# Patient Record
Sex: Female | Born: 1955
Health system: Southern US, Community
[De-identification: ages and names within clinical notes are randomized; demographics above are authoritative.]

## PROBLEM LIST (undated history)

## (undated) DIAGNOSIS — F319 Bipolar disorder, unspecified: Secondary | ICD-10-CM

## (undated) DIAGNOSIS — IMO0002 Reserved for concepts with insufficient information to code with codable children: Secondary | ICD-10-CM

## (undated) DIAGNOSIS — I10 Essential (primary) hypertension: Secondary | ICD-10-CM

## (undated) DIAGNOSIS — F32A Depression, unspecified: Secondary | ICD-10-CM

## (undated) DIAGNOSIS — K5900 Constipation, unspecified: Secondary | ICD-10-CM

## (undated) DIAGNOSIS — R419 Unspecified symptoms and signs involving cognitive functions and awareness: Secondary | ICD-10-CM

## (undated) DIAGNOSIS — F329 Major depressive disorder, single episode, unspecified: Secondary | ICD-10-CM

## (undated) DIAGNOSIS — E785 Hyperlipidemia, unspecified: Secondary | ICD-10-CM

## (undated) DIAGNOSIS — F609 Personality disorder, unspecified: Secondary | ICD-10-CM

## (undated) HISTORY — DX: Hyperlipidemia, unspecified: E78.5

## (undated) HISTORY — PX: RIGHT OOPHORECTOMY: SHX2359

## (undated) HISTORY — DX: Depression, unspecified: F32.A

## (undated) HISTORY — PX: DILATION AND CURETTAGE OF UTERUS: SHX78

## (undated) HISTORY — DX: Constipation, unspecified: K59.00

## (undated) HISTORY — DX: Unspecified symptoms and signs involving cognitive functions and awareness: R41.9

## (undated) HISTORY — DX: Major depressive disorder, single episode, unspecified: F32.9

## (undated) HISTORY — DX: Personality disorder, unspecified: F60.9

---

## 2000-09-24 ENCOUNTER — Emergency Department (HOSPITAL_COMMUNITY): Admission: EM | Admit: 2000-09-24 | Discharge: 2000-09-24 | Payer: Self-pay | Admitting: Emergency Medicine

## 2006-12-11 ENCOUNTER — Emergency Department (HOSPITAL_COMMUNITY): Admission: EM | Admit: 2006-12-11 | Discharge: 2006-12-11 | Payer: Self-pay | Admitting: *Deleted

## 2006-12-21 ENCOUNTER — Emergency Department (HOSPITAL_COMMUNITY): Admission: EM | Admit: 2006-12-21 | Discharge: 2006-12-21 | Payer: Self-pay | Admitting: Emergency Medicine

## 2006-12-24 ENCOUNTER — Emergency Department (HOSPITAL_COMMUNITY): Admission: EM | Admit: 2006-12-24 | Discharge: 2006-12-24 | Payer: Self-pay | Admitting: Emergency Medicine

## 2007-01-15 ENCOUNTER — Emergency Department (HOSPITAL_COMMUNITY): Admission: EM | Admit: 2007-01-15 | Discharge: 2007-01-15 | Payer: Self-pay | Admitting: Emergency Medicine

## 2007-01-27 ENCOUNTER — Emergency Department (HOSPITAL_COMMUNITY): Admission: EM | Admit: 2007-01-27 | Discharge: 2007-01-27 | Payer: Self-pay | Admitting: Emergency Medicine

## 2007-03-28 ENCOUNTER — Encounter: Admission: RE | Admit: 2007-03-28 | Discharge: 2007-03-28 | Payer: Self-pay | Admitting: Family Medicine

## 2007-04-26 ENCOUNTER — Emergency Department (HOSPITAL_COMMUNITY): Admission: EM | Admit: 2007-04-26 | Discharge: 2007-04-27 | Payer: Self-pay | Admitting: Emergency Medicine

## 2007-05-23 ENCOUNTER — Encounter: Admission: RE | Admit: 2007-05-23 | Discharge: 2007-05-23 | Payer: Self-pay | Admitting: Family Medicine

## 2007-06-06 ENCOUNTER — Encounter: Admission: RE | Admit: 2007-06-06 | Discharge: 2007-06-06 | Payer: Self-pay | Admitting: Obstetrics & Gynecology

## 2007-08-18 ENCOUNTER — Encounter: Admission: RE | Admit: 2007-08-18 | Discharge: 2007-10-30 | Payer: Self-pay | Admitting: Family Medicine

## 2008-01-25 ENCOUNTER — Other Ambulatory Visit: Payer: Self-pay | Admitting: *Deleted

## 2008-01-25 ENCOUNTER — Ambulatory Visit: Payer: Self-pay | Admitting: Psychiatry

## 2008-01-25 ENCOUNTER — Other Ambulatory Visit: Payer: Self-pay

## 2008-01-25 ENCOUNTER — Inpatient Hospital Stay (HOSPITAL_COMMUNITY): Admission: EM | Admit: 2008-01-25 | Discharge: 2008-01-27 | Payer: Self-pay | Admitting: Psychiatry

## 2008-05-29 IMAGING — CR DG CHEST 2V
2 series · 2 of 2 positions shown · non-contrast
Comparison: None.

CLINICAL DATA: Cough, chest congestion, myalgias and pharyngitis.

CHEST - 2 VIEW

[w chest pa]
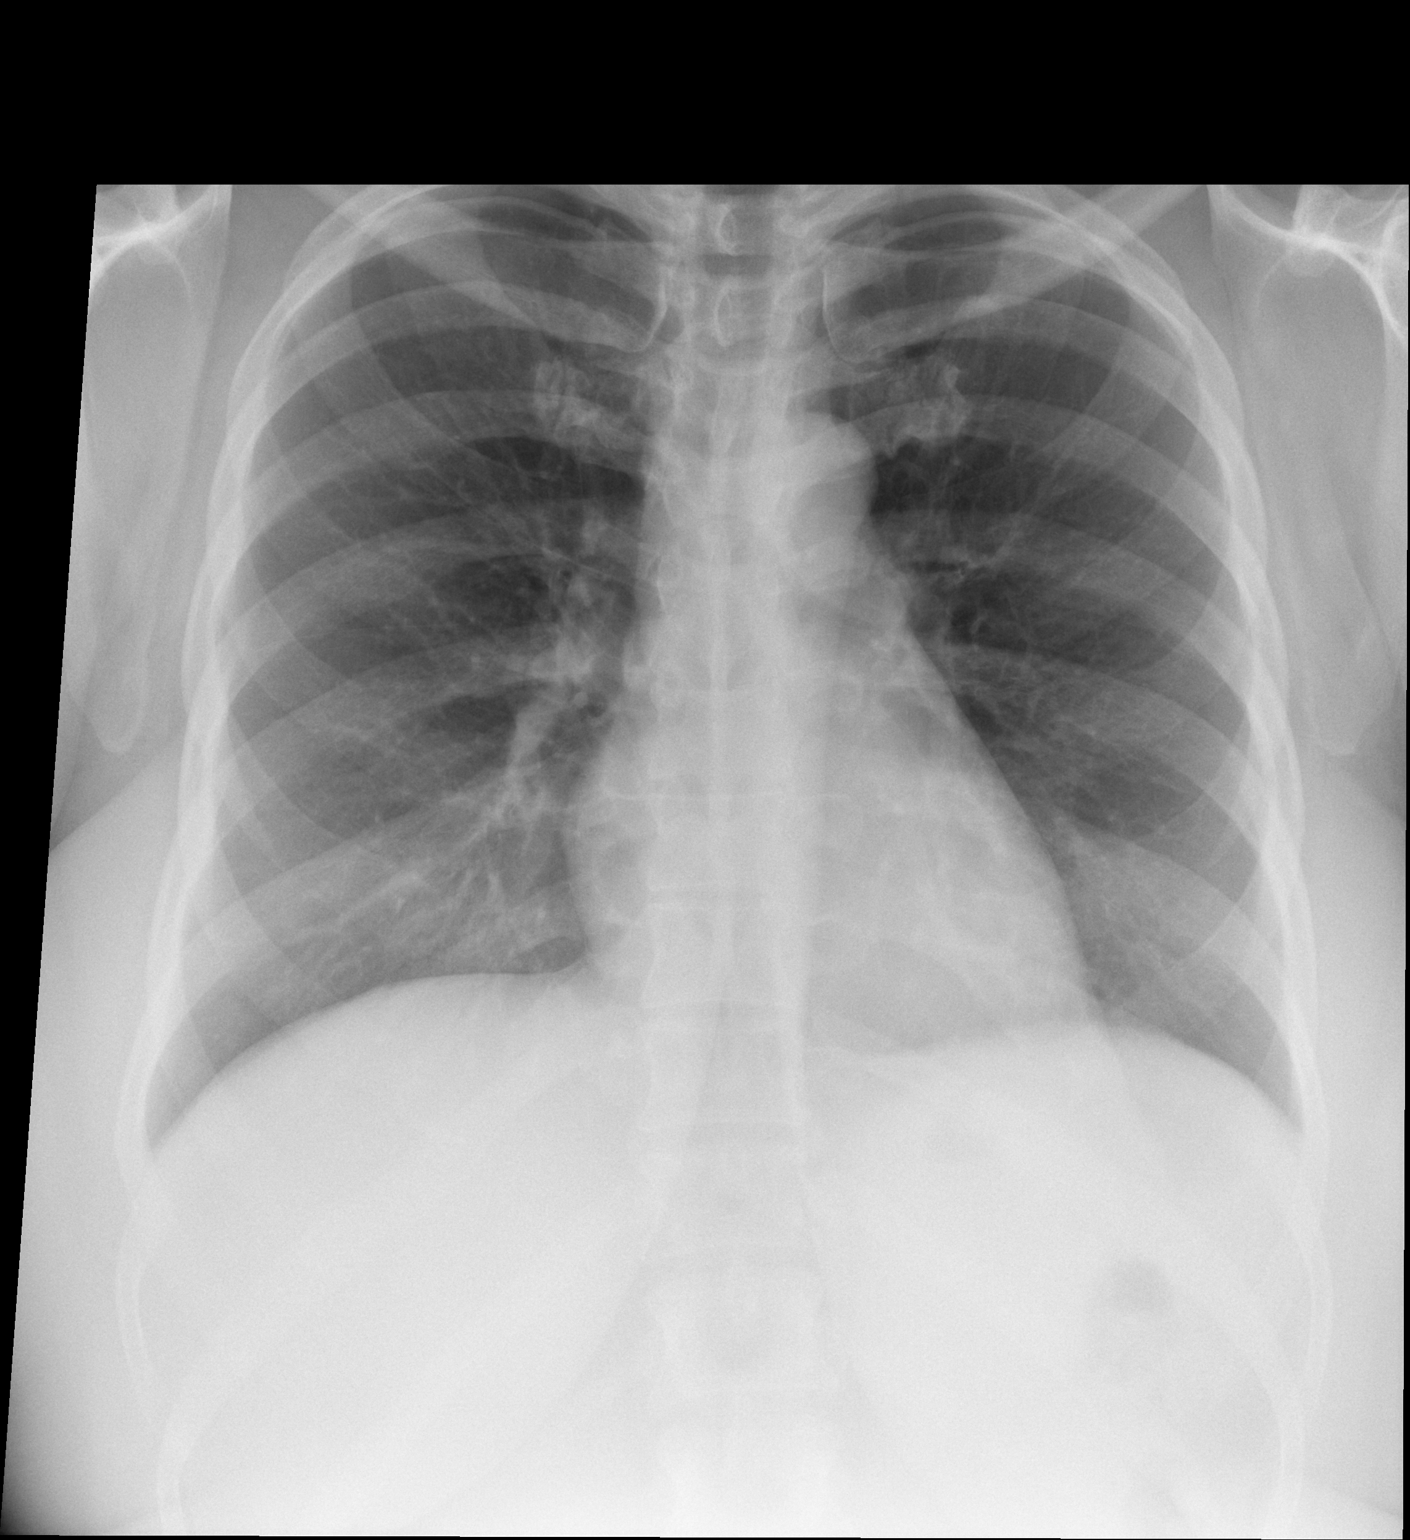

[w chest lat]
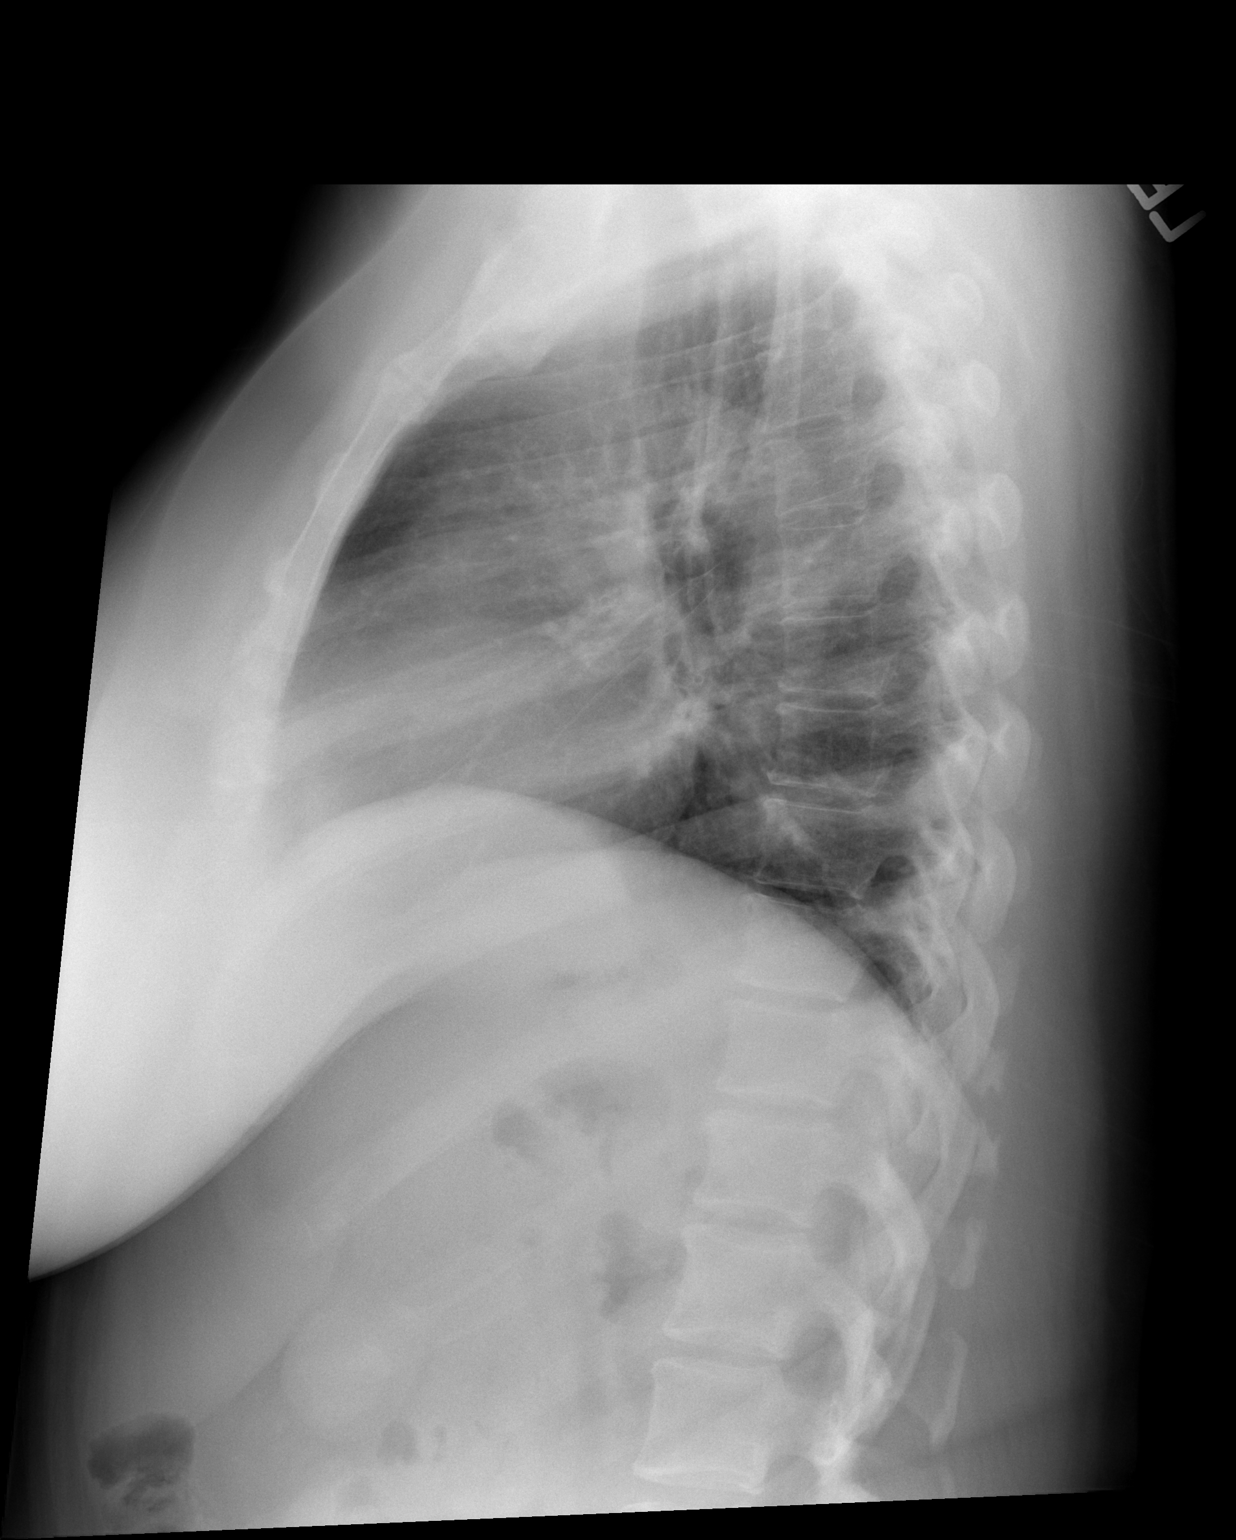

[2 of 2 positions shown; findings below may reference images not displayed]

FINDINGS: Normal sized heart. Clear lungs. Central peribronchial thickening.
Minimal scoliosis and minimal thoracic spine degenerative changes.

IMPRESSION

Mild bronchitic changes.

## 2008-06-25 IMAGING — MG MM SCREEN MAMMOGRAM BILATERAL
4 series · 4 of 4 positions shown · non-contrast
Comparison: none

DG SCREEN MAMMOGRAM BILATERAL
Bilateral CC and MLO view(s) were taken.

DIGITAL SCREENING MAMMOGRAM WITH CAD:
The breast tissue is almost entirely fatty.  There is no dominant mass, architectural distortion or
calcification to suggest malignancy.

[R CC]
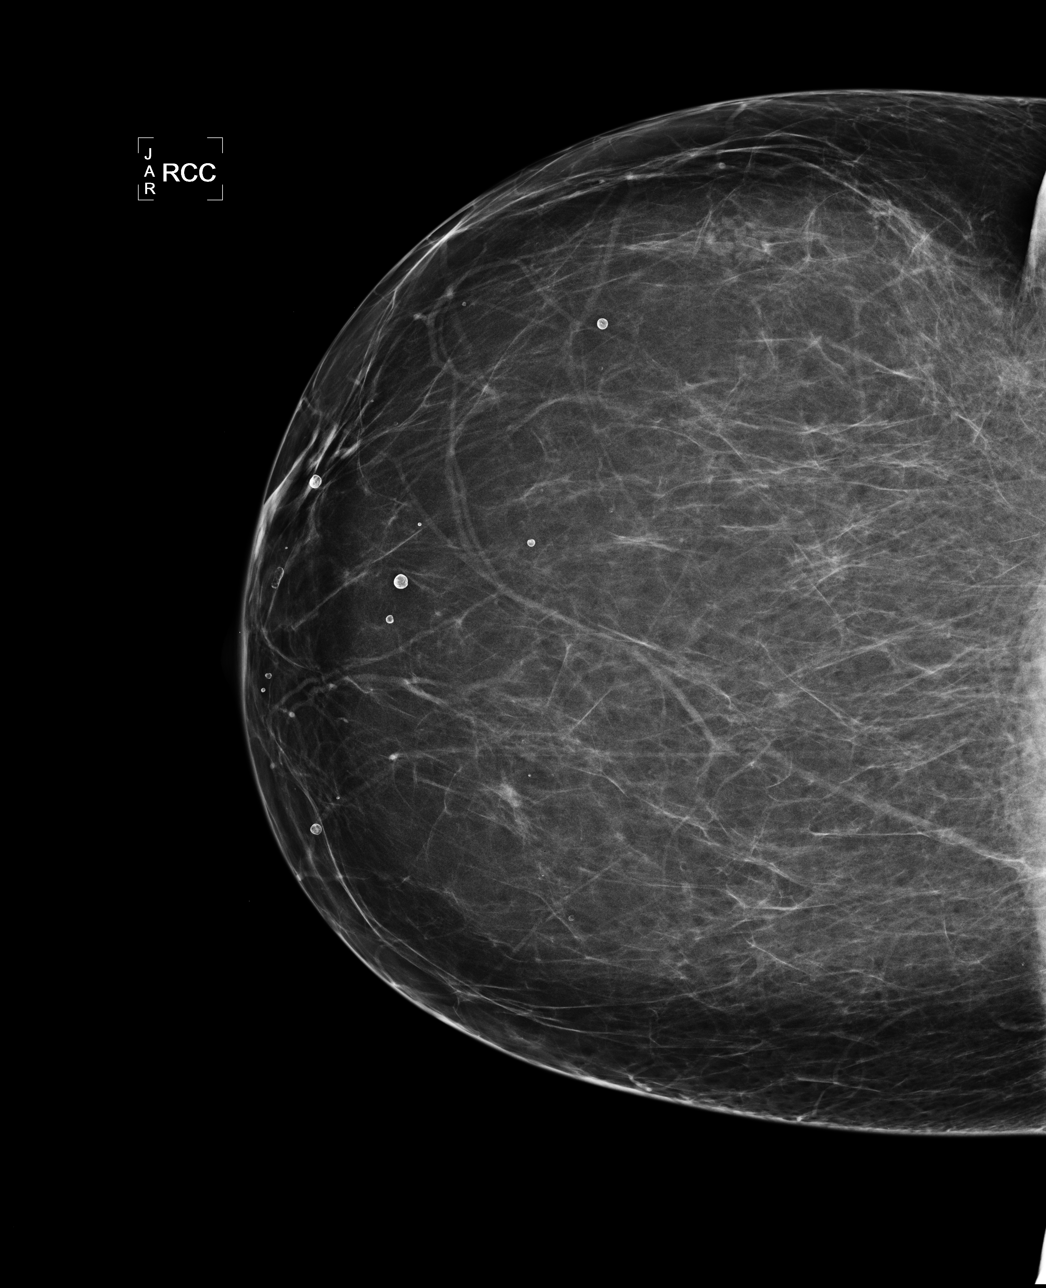

[L CC]
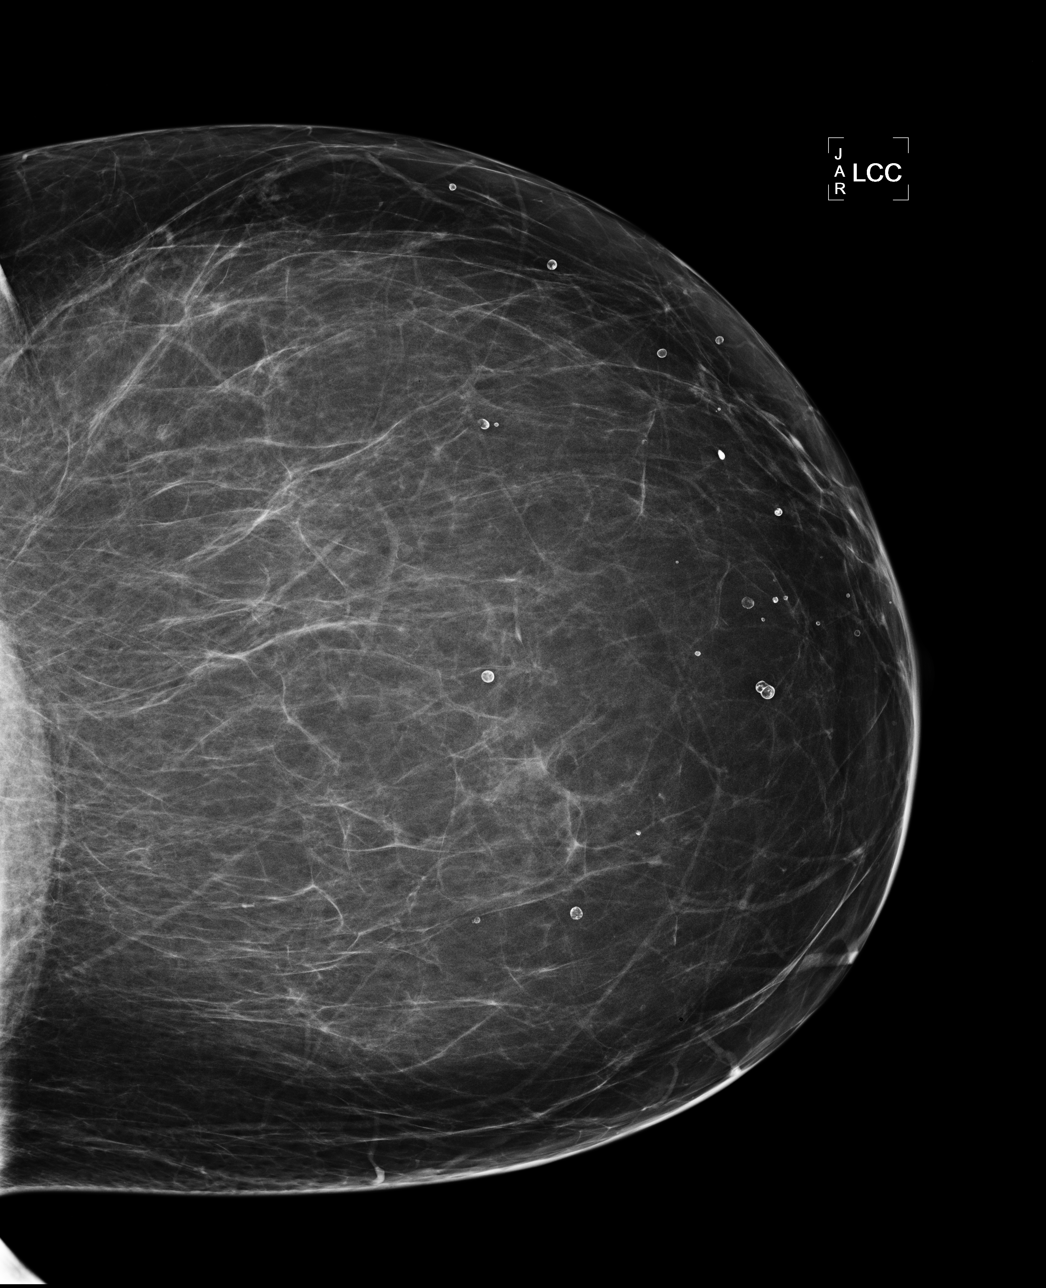

[L MLO]
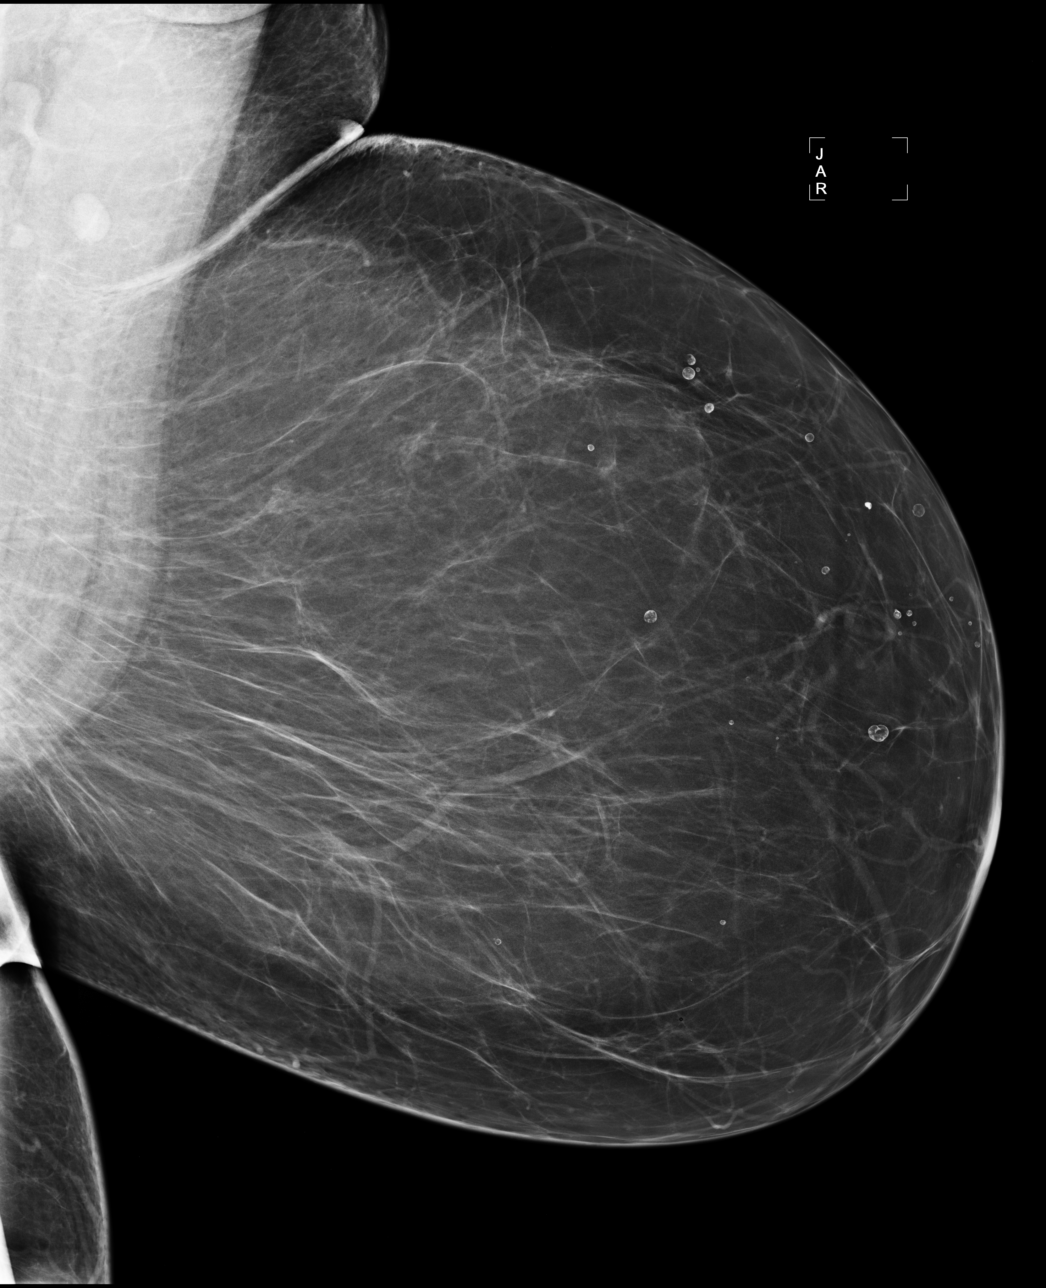

[R MLO]
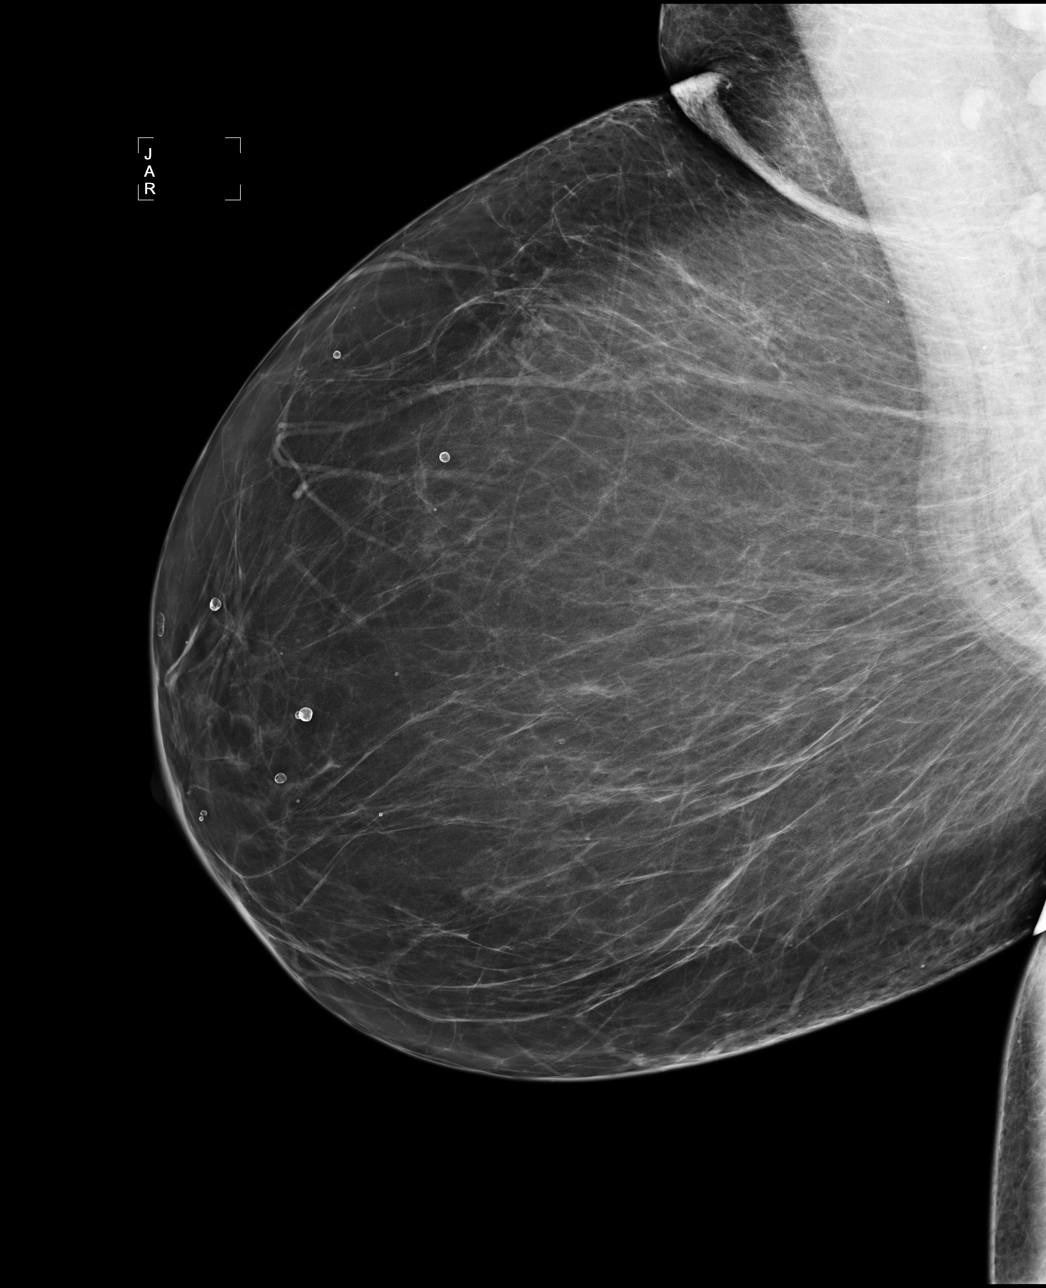

[4 of 4 positions shown; findings below may reference images not displayed]

IMPRESSION: No mammographic evidence of malignancy.  Suggest yearly screening mammography.

ASSESSMENT: Negative - BI-RADS 1

Screening mammogram in 1 year.
ANALYZED BY COMPUTER AIDED DETECTION. , THIS PROCEDURE WAS A DIGITAL MAMMOGRAM.

## 2008-12-13 ENCOUNTER — Ambulatory Visit: Payer: Self-pay | Admitting: *Deleted

## 2008-12-13 ENCOUNTER — Inpatient Hospital Stay (HOSPITAL_COMMUNITY): Admission: AD | Admit: 2008-12-13 | Discharge: 2008-12-16 | Payer: Self-pay | Admitting: *Deleted

## 2009-07-07 ENCOUNTER — Ambulatory Visit: Payer: Self-pay | Admitting: Internal Medicine

## 2009-07-15 ENCOUNTER — Ambulatory Visit: Payer: Self-pay | Admitting: Internal Medicine

## 2009-07-21 ENCOUNTER — Ambulatory Visit (HOSPITAL_COMMUNITY): Admission: RE | Admit: 2009-07-21 | Discharge: 2009-07-21 | Payer: Self-pay | Admitting: Internal Medicine

## 2009-08-14 ENCOUNTER — Emergency Department (HOSPITAL_COMMUNITY): Admission: EM | Admit: 2009-08-14 | Discharge: 2009-08-14 | Payer: Self-pay | Admitting: Emergency Medicine

## 2009-08-31 ENCOUNTER — Ambulatory Visit: Payer: Self-pay | Admitting: Internal Medicine

## 2009-08-31 ENCOUNTER — Emergency Department (HOSPITAL_COMMUNITY): Admission: EM | Admit: 2009-08-31 | Discharge: 2009-09-01 | Payer: Self-pay | Admitting: Emergency Medicine

## 2009-10-04 ENCOUNTER — Ambulatory Visit: Payer: Self-pay | Admitting: Family Medicine

## 2009-10-04 ENCOUNTER — Encounter (INDEPENDENT_AMBULATORY_CARE_PROVIDER_SITE_OTHER): Payer: Self-pay | Admitting: Family Medicine

## 2009-11-28 ENCOUNTER — Ambulatory Visit: Payer: Self-pay | Admitting: Internal Medicine

## 2010-01-06 ENCOUNTER — Ambulatory Visit (HOSPITAL_COMMUNITY): Admission: RE | Admit: 2010-01-06 | Discharge: 2010-01-06 | Payer: Self-pay | Admitting: Family Medicine

## 2010-01-09 ENCOUNTER — Encounter
Admission: RE | Admit: 2010-01-09 | Discharge: 2010-02-17 | Payer: Self-pay | Source: Home / Self Care | Admitting: Family Medicine

## 2010-03-06 ENCOUNTER — Encounter (INDEPENDENT_AMBULATORY_CARE_PROVIDER_SITE_OTHER): Payer: Self-pay | Admitting: *Deleted

## 2010-03-06 LAB — CONVERTED CEMR LAB
AST: 99 units/L — ABNORMAL HIGH (ref 0–37)
Alkaline Phosphatase: 123 units/L — ABNORMAL HIGH (ref 39–117)
BUN: 17 mg/dL (ref 6–23)
Basophils Relative: 0 % (ref 0–1)
Creatinine, Ser: 0.88 mg/dL (ref 0.40–1.20)
Eosinophils Absolute: 0 10*3/uL (ref 0.0–0.7)
Eosinophils Relative: 1 % (ref 0–5)
Glucose, Bld: 146 mg/dL — ABNORMAL HIGH (ref 70–99)
HCT: 39.4 % (ref 36.0–46.0)
MCHC: 31 g/dL (ref 30.0–36.0)
MCV: 97.8 fL (ref 78.0–100.0)
Monocytes Absolute: 0.3 10*3/uL (ref 0.1–1.0)
Monocytes Relative: 7 % (ref 3–12)
Neutrophils Relative %: 54 % (ref 43–77)
Potassium: 4.8 meq/L (ref 3.5–5.3)
RBC: 4.03 M/uL (ref 3.87–5.11)
Total Bilirubin: 0.3 mg/dL (ref 0.3–1.2)

## 2010-03-22 ENCOUNTER — Ambulatory Visit (HOSPITAL_BASED_OUTPATIENT_CLINIC_OR_DEPARTMENT_OTHER)
Admission: RE | Admit: 2010-03-22 | Discharge: 2010-03-22 | Payer: Self-pay | Source: Home / Self Care | Attending: Family Medicine | Admitting: Family Medicine

## 2010-04-12 ENCOUNTER — Encounter (INDEPENDENT_AMBULATORY_CARE_PROVIDER_SITE_OTHER): Payer: Self-pay | Admitting: *Deleted

## 2010-04-12 LAB — CONVERTED CEMR LAB
CO2: 28 meq/L (ref 19–32)
Calcium: 10.4 mg/dL (ref 8.4–10.5)
Creatinine, Ser: 0.89 mg/dL (ref 0.40–1.20)
Glucose, Bld: 170 mg/dL — ABNORMAL HIGH (ref 70–99)
Sodium: 137 meq/L (ref 135–145)

## 2010-04-23 ENCOUNTER — Encounter: Payer: Self-pay | Admitting: Family Medicine

## 2010-04-25 ENCOUNTER — Emergency Department (HOSPITAL_COMMUNITY)
Admission: EM | Admit: 2010-04-25 | Discharge: 2010-04-25 | Disposition: A | Discharge status: Other Acute Inpatient Hospital | Payer: Self-pay | Source: Home / Self Care | Admitting: Emergency Medicine

## 2010-04-25 ENCOUNTER — Inpatient Hospital Stay (HOSPITAL_COMMUNITY)
Admission: AD | Admit: 2010-04-25 | Discharge: 2010-05-01 | Payer: Self-pay | Source: Home / Self Care | Attending: Psychiatry | Admitting: Psychiatry

## 2010-04-26 LAB — DIFFERENTIAL
Basophils Absolute: 0 10*3/uL (ref 0.0–0.1)
Basophils Relative: 0 % (ref 0–1)
Eosinophils Absolute: 0 10*3/uL (ref 0.0–0.7)
Monocytes Relative: 7 % (ref 3–12)
Neutrophils Relative %: 52 % (ref 43–77)

## 2010-04-26 LAB — HEPATIC FUNCTION PANEL
Bilirubin, Direct: 0.1 mg/dL (ref 0.0–0.3)
Indirect Bilirubin: 0.4 mg/dL (ref 0.3–0.9)
Indirect Bilirubin: 0.2 mg/dL — ABNORMAL LOW (ref 0.3–0.9)
Total Bilirubin: 0.3 mg/dL (ref 0.3–1.2)
Total Protein: 8.6 g/dL — ABNORMAL HIGH (ref 6.0–8.3)

## 2010-04-26 LAB — TSH: TSH: 1.511 u[IU]/mL (ref 0.350–4.500)

## 2010-04-26 LAB — BASIC METABOLIC PANEL
BUN: 15 mg/dL (ref 6–23)
Creatinine, Ser: 0.96 mg/dL (ref 0.4–1.2)
GFR calc Af Amer: >60 mL/min (ref >60)
GFR calc non Af Amer: >60 mL/min (ref >60)
Potassium: 4 mEq/L (ref 3.5–5.1)

## 2010-04-26 LAB — CBC
MCH: 30.6 pg (ref 26.0–34.0)
Platelets: 222 10*3/uL (ref 150–400)
RBC: 4.28 MIL/uL (ref 3.87–5.11)

## 2010-04-26 LAB — GLUCOSE, CAPILLARY
Glucose-Capillary: 191 mg/dL — ABNORMAL HIGH (ref 70–99)
Glucose-Capillary: 148 mg/dL — ABNORMAL HIGH (ref 70–99)

## 2010-04-26 LAB — RAPID URINE DRUG SCREEN, HOSP PERFORMED
Barbiturates: NOT DETECTED
Benzodiazepines: POSITIVE — AB

## 2010-04-26 LAB — ETHANOL: Alcohol, Ethyl (B): <5 mg/dL (ref 0–10)

## 2010-04-27 LAB — GLUCOSE, CAPILLARY
Glucose-Capillary: 181 mg/dL — ABNORMAL HIGH (ref 70–99)
Glucose-Capillary: 157 mg/dL — ABNORMAL HIGH (ref 70–99)
Glucose-Capillary: 134 mg/dL — ABNORMAL HIGH (ref 70–99)
Glucose-Capillary: 136 mg/dL — ABNORMAL HIGH (ref 70–99)

## 2010-04-28 LAB — GLUCOSE, CAPILLARY
Glucose-Capillary: 162 mg/dL — ABNORMAL HIGH (ref 70–99)
Glucose-Capillary: 157 mg/dL — ABNORMAL HIGH (ref 70–99)
Glucose-Capillary: 225 mg/dL — ABNORMAL HIGH (ref 70–99)
Glucose-Capillary: 92 mg/dL (ref 70–99)

## 2010-04-28 LAB — HEPATITIS PANEL, ACUTE: HCV Ab: NEGATIVE

## 2010-04-29 LAB — GLUCOSE, CAPILLARY
Glucose-Capillary: 158 mg/dL — ABNORMAL HIGH (ref 70–99)
Glucose-Capillary: 98 mg/dL (ref 70–99)
Glucose-Capillary: 174 mg/dL — ABNORMAL HIGH (ref 70–99)

## 2010-04-30 LAB — HEMOGLOBIN A1C
Hgb A1c MFr Bld: 8.5 % — ABNORMAL HIGH (ref <5.7)
Mean Plasma Glucose: 197 mg/dL — ABNORMAL HIGH (ref <117)

## 2010-04-30 LAB — GLUCOSE, CAPILLARY

## 2010-05-01 LAB — GLUCOSE, CAPILLARY: Glucose-Capillary: 134 mg/dL — ABNORMAL HIGH (ref 70–99)

## 2010-05-01 NOTE — H&P (Signed)
NAME:  Diana Watson, Diana Watson        ACCOUNT NO.:  [id number]  MEDICAL RECORD NO.:  [id number]          PATIENT TYPE:  IPS  LOCATION:  0300                          FACILITY:  [facility]  PHYSICIAN:  Eulogio Ditch, MD DATE OF BIRTH:  19-May-1955  DATE OF ADMISSION:  04/25/2010 DATE OF DISCHARGE:                      PSYCHIATRIC ADMISSION ASSESSMENT   This is a 55 year old female involuntarily admitted on April 25, 2010.  HISTORY OF PRESENT ILLNESS:  The patient was seen in conjunction with Dr. Rogers Blocker.  The patient presented to the emergency department with suicidal thoughts to either overdose or run into traffic.  She states that she is having trouble with sleepwalking, which has been going on for some time.  The patient endorses an event in June where she was sleepwalking from her apartment to a food store.  She reports taking her patient's pain pills, using about 1 a day for the past 3 weeks.  She states she over abuses her Seroquel.  Wants to get help to get back with the right medicines.  She denies any hallucinations.  PAST PSYCHIATRIC HISTORY:  The patient had been here about 2 years ago. This is her [facility] admission to the [facility].  She sees Brock Bad, Publishing rights manager, at [facility] and is being treated for a history of bipolar disorder.  SOCIAL HISTORY:  The patient is married.  She lives with her husband. She has no children.  She is on disability for bipolar disorder.  FAMILY HISTORY:  None.  The patient denies any alcohol or substance use.  Does have a history of cocaine use but reports sobriety for the past 3 years.  PRIMARY CARE PROVIDER:  [facility].  MEDICAL PROBLEMS:  A history of hypertension, diabetes mellitus, hyperlipidemia.  The patient did have a sleep study in December 2011 that was negative.  MEDICATIONS:  The patient lists Abilify 15 mg daily, Lexapro 10 mg daily, Lamictal 200 mg daily, hydroxyzine 25 mg 1  q.h.s. as needed, hydrochlorothiazide 25 mg daily, Valium 5 mg 1 tablet b.i.d. as needed, Actos 50 mg daily, fish oil, vitamin D, Colace as needed, Benadryl 25 mg 2 tablets daily as needed for sleep, haloperidol 2 mg 2 tablets daily at bedtime.  The patient reports compliance with her medications.  DRUG ALLERGIES:  Prednisone.  States the reaction of having a manic episode.  PHYSICAL EXAM:  This is a short-statured middle-aged female.  She was assessed in the emergency department.  Her physical exam was reviewed. No significant findings.  The patient denies any complaints.  LABORATORY DATA:  Shows a CBC within normal limits.  Alcohol level less than 5.  Glucose 213.  Urine drug screen is positive for benzodiazepines.  MENTAL STATUS EXAM:  The patient is fully alert and cooperative.  She is currently dressed in scrubs, somewhat disheveled.  Intermittent eye contact.  Her speech is clear.  Her mood is depressed.  She became tearful at times when talking about her episodes with the sleep apnea, wanting to get help.  She is open to changes in medications.  Denies any psychotic symptoms.  Denies any hallucinations.  Concentration intact. Her memory, recent and remote, appears intact.  AXIS  I:  Bipolar disorder not otherwise specified. AXIS II:  Deferred. AXIS III:  History of hypertension, diabetes, hyperlipidemia. AXIS IV:  Deferred at this time. AXIS V:  Current is 35.  Our plan is to simplify her medications.  We will continue with only her antidepressant and her mood stabilizer with her Abilify for some mood stabilization.  We will also monitor her blood sugars and continue with her Actos and continue her fish oil.  We will contact her husband for concerns and safety issues, contact Brock Bad for further insight, and will also address her recent use of OxyContin.  Her tentative length of stay at this time is 3-5 days.     Landry Corporal,  N.P.   ______________________________ Eulogio Ditch, MD    JO/MEDQ  D:  04/26/2010  T:  04/26/2010  Job:  [id number]  Electronically Signed by Limmie PatriciaP. on 05/01/2010 03:46:44 PM Electronically Signed by Eulogio Ditch  on 05/01/2010 06:22:55 PM

## 2010-05-01 NOTE — Discharge Summary (Signed)
  NAME:  Diana Watson, Diana Watson        ACCOUNT NO.:  [id number]  MEDICAL RECORD NO.:  [id number]          PATIENT TYPE:  IPS  LOCATION:  0300                          FACILITY:  [facility]  PHYSICIAN:  Eulogio Ditch, MD DATE OF BIRTH:  1955/12/10  DATE OF ADMISSION:  04/25/2010 DATE OF DISCHARGE:  05/01/2010                              DISCHARGE SUMMARY   HISTORY OF PRESENT ILLNESS:  A 55 year old patient who was admitted on commitment papers as she was having suicidal thoughts with a plan to overdose or run into the traffic.  Patient also reported sleep walking. Patient also reported that she was over abusing her Seroquel.  At the time of admission, patient denied hearing any voices but reported depressed mood.  PAST PSYCH HISTORY:  Patient was admitted to [facility] 2 years ago.  She is following Brock Bad, nurse practitioner, at [facility] for a history of bipolar disorder.  SOCIAL HISTORY:  Patient is married, lives with her husband.  She has no children.  She is on disability for bipolar disorder.  FAMILY PSYCH HISTORY:  None.  SUBSTANCE ABUSE HISTORY:  Patient denied abusing alcohol or other drugs. She has a history of cocaine abuse but she is sober for the last 3 years.  MEDICAL PROBLEMS: 1. History of hypertension. 2. Diabetes. 3. Hyperlipidemia. 4. Sleep studies were done in December 2011 that were negative.  HOSPITAL COURSE:  During the hospital stay, patient's medications were assessed.  Patient was on Abilify, Lexapro, Lamictal, oxycodone, Geodon, Seroquel, trazodone, clonazepam.  As the patient was taking too many medications, psychoeducation was given to the patient regarding the medication risks, benefits, and side effects.  The patient was started on new treatment plan on Abilify 10 mg at bedtime, Lexapro 10 mg p.o. daily, Lamictal 200 mg p.o. daily.  Patient responded to this combination very well.  Patient denied any sleepwalking  during the hospital stay, denied hearing voices.  On Monday, May 01, 2010, when I saw the patient, patient told me that she is feeling very good.  She has denied any suicidal ideations.  She denied hearing any voices.  No psychotic or manic symptoms were noticed during the interview.  Again, the psychoeducation was given regarding the use of the medication and their side effects.  Patient was advised to continue her regular medical medications.  DISCHARGE MEDICATIONS: 1. Lexapro 10 mg p.o. daily. 2. Lamictal 200 mg p.o. daily. 3. Abilify 10 mg p.o. daily. 4. Klonopin 1 mg b.i.d. p.r.n. as needed for anxiety.  DISCHARGE DIAGNOSIS:  Bipolar disorder, mixed type.  DISCHARGE FOLLOWUP: 1. Patient will follow with Brock Bad, nurse practitioner, at     [facility], phone number [phone], appointment May 04, 2010, at 1:30 p.m. 2. She will follow up with Sharlotte Alamo, therapist at [facility],     phone number [phone], appointment May 04, 2010, at 11 a.m.     Eulogio Ditch, MD     SA/MEDQ  D:  05/01/2010  T:  05/01/2010  Job:  [id number]  Electronically Signed by Eulogio Ditch  on 05/01/2010 06:23:07 PM

## 2010-05-16 ENCOUNTER — Ambulatory Visit (HOSPITAL_BASED_OUTPATIENT_CLINIC_OR_DEPARTMENT_OTHER): Payer: Medicare Other | Attending: Internal Medicine

## 2010-05-16 DIAGNOSIS — G4710 Hypersomnia, unspecified: Secondary | ICD-10-CM | POA: Insufficient documentation

## 2010-05-16 DIAGNOSIS — G4730 Sleep apnea, unspecified: Secondary | ICD-10-CM | POA: Insufficient documentation

## 2010-05-20 DIAGNOSIS — G4730 Sleep apnea, unspecified: Secondary | ICD-10-CM

## 2010-05-20 DIAGNOSIS — G4710 Hypersomnia, unspecified: Secondary | ICD-10-CM

## 2010-05-29 ENCOUNTER — Ambulatory Visit: Payer: Medicare Other | Admitting: Dietician

## 2010-05-31 ENCOUNTER — Ambulatory Visit: Payer: Medicare Other | Admitting: Dietician

## 2010-06-01 ENCOUNTER — Encounter: Payer: Medicare Other | Attending: Family Medicine | Admitting: Dietician

## 2010-06-01 DIAGNOSIS — E119 Type 2 diabetes mellitus without complications: Secondary | ICD-10-CM | POA: Insufficient documentation

## 2010-06-01 DIAGNOSIS — Z713 Dietary counseling and surveillance: Secondary | ICD-10-CM | POA: Insufficient documentation

## 2010-06-01 DIAGNOSIS — I10 Essential (primary) hypertension: Secondary | ICD-10-CM | POA: Insufficient documentation

## 2010-06-19 LAB — BASIC METABOLIC PANEL
BUN: 9 mg/dL (ref 6–23)
Calcium: 9.9 mg/dL (ref 8.4–10.5)
GFR calc non Af Amer: >60 mL/min (ref >60)
Glucose, Bld: 130 mg/dL — ABNORMAL HIGH (ref 70–99)
Potassium: 3.7 mEq/L (ref 3.5–5.1)
Sodium: 139 mEq/L (ref 135–145)

## 2010-06-19 LAB — DIFFERENTIAL
Basophils Absolute: 0 10*3/uL (ref 0.0–0.1)
Eosinophils Absolute: 0.1 10*3/uL (ref 0.0–0.7)
Lymphocytes Relative: 42 % (ref 12–46)
Lymphocytes Relative: 40 % (ref 12–46)
Lymphs Abs: 3 10*3/uL (ref 0.7–4.0)
Lymphs Abs: 2.9 10*3/uL (ref 0.7–4.0)
Monocytes Relative: 5 % (ref 3–12)
Neutro Abs: 3.5 10*3/uL (ref 1.7–7.7)
Neutro Abs: 3.9 10*3/uL (ref 1.7–7.7)
Neutrophils Relative %: 54 % (ref 43–77)

## 2010-06-19 LAB — COMPREHENSIVE METABOLIC PANEL
BUN: 12 mg/dL (ref 6–23)
CO2: 28 mEq/L (ref 19–32)
Calcium: 9.4 mg/dL (ref 8.4–10.5)
Creatinine, Ser: 0.75 mg/dL (ref 0.4–1.2)
GFR calc non Af Amer: >60 mL/min (ref >60)
Glucose, Bld: 93 mg/dL (ref 70–99)
Sodium: 137 mEq/L (ref 135–145)
Total Protein: 8 g/dL (ref 6.0–8.3)

## 2010-06-19 LAB — URINALYSIS, ROUTINE W REFLEX MICROSCOPIC
Glucose, UA: NEGATIVE mg/dL
Leukocytes, UA: NEGATIVE
Nitrite: NEGATIVE
Specific Gravity, Urine: 1.011 (ref 1.005–1.030)
pH: 5.5 (ref 5.0–8.0)

## 2010-06-19 LAB — CBC
HCT: 36.8 % (ref 36.0–46.0)
Hemoglobin: 12.8 g/dL (ref 12.0–15.0)
MCHC: 34.3 g/dL (ref 30.0–36.0)
MCV: 96.1 fL (ref 78.0–100.0)
Platelets: 179 10*3/uL (ref 150–400)
RBC: 3.87 MIL/uL (ref 3.87–5.11)
RDW: 13.5 % (ref 11.5–15.5)
RDW: 12.9 % (ref 11.5–15.5)
WBC: 7 10*3/uL (ref 4.0–10.5)

## 2010-06-19 LAB — RAPID URINE DRUG SCREEN, HOSP PERFORMED
Barbiturates: NOT DETECTED
Barbiturates: NOT DETECTED
Benzodiazepines: NOT DETECTED
Cocaine: NOT DETECTED
Opiates: NOT DETECTED
Tetrahydrocannabinol: NOT DETECTED

## 2010-06-19 LAB — URINE MICROSCOPIC-ADD ON

## 2010-06-19 LAB — POCT PREGNANCY, URINE: Preg Test, Ur: NEGATIVE

## 2010-06-19 LAB — ETHANOL: Alcohol, Ethyl (B): <5 mg/dL (ref 0–10)

## 2010-07-07 LAB — GLUCOSE, CAPILLARY
Glucose-Capillary: 132 mg/dL — ABNORMAL HIGH (ref 70–99)
Glucose-Capillary: 138 mg/dL — ABNORMAL HIGH (ref 70–99)
Glucose-Capillary: 152 mg/dL — ABNORMAL HIGH (ref 70–99)
Glucose-Capillary: 158 mg/dL — ABNORMAL HIGH (ref 70–99)

## 2010-07-07 LAB — COMPREHENSIVE METABOLIC PANEL
ALT: 93 U/L — ABNORMAL HIGH (ref 0–35)
AST: 81 U/L — ABNORMAL HIGH (ref 0–37)
Albumin: 4.1 g/dL (ref 3.5–5.2)
Calcium: 9.3 mg/dL (ref 8.4–10.5)
Chloride: 103 mEq/L (ref 96–112)
Creatinine, Ser: 1.03 mg/dL (ref 0.4–1.2)
GFR calc Af Amer: >60 mL/min (ref >60)
Sodium: 135 mEq/L (ref 135–145)

## 2010-07-07 LAB — DRUGS OF ABUSE SCREEN W/O ALC, ROUTINE URINE
Amphetamine Screen, Ur: NEGATIVE
Barbiturate Quant, Ur: NEGATIVE
Creatinine,U: 33.9 mg/dL
Marijuana Metabolite: NEGATIVE
Phencyclidine (PCP): NEGATIVE
Propoxyphene: NEGATIVE

## 2010-07-07 LAB — URINALYSIS, ROUTINE W REFLEX MICROSCOPIC
Nitrite: NEGATIVE
Specific Gravity, Urine: 1.005 (ref 1.005–1.030)
Urobilinogen, UA: 0.2 mg/dL (ref 0.0–1.0)

## 2010-07-07 LAB — CBC
MCV: 98 fL (ref 78.0–100.0)
Platelets: 225 10*3/uL (ref 150–400)
WBC: 6.4 10*3/uL (ref 4.0–10.5)

## 2010-08-15 NOTE — H&P (Signed)
NAME:  Diana Watson, Diana Watson         ACCOUNT NO.:  [id number]   MEDICAL RECORD NO.:  [id number]          PATIENT TYPE:  IPS   LOCATION:  0502                          FACILITY:  BH   PHYSICIAN:  Anselm Jungling, MD  DATE OF BIRTH:  02/09/1956   DATE OF ADMISSION:  01/25/2008  DATE OF DISCHARGE:  01/27/2008                       PSYCHIATRIC ADMISSION ASSESSMENT   A 55 year old female voluntarily admitted on January 25, 2008.   HISTORY OF PRESENT ILLNESS:  The patient presents with a history  depression, suicidal thoughts, had no specific plan.  She reports she  has been off her medications because she states she was feeling well  without them for some period of time, but then became irritable, having  mood swings, decreased sleep and was having irrational suicidal  thoughts, getting mad at her husband who she states no reason to feel  like this and asked her husband to take her, felt she needed to be  admitted to get back on her medications.   PAST PSYCHIATRIC HISTORY:  First admission to [facility].  Has a history of bipolar disorder.  States she was diagnosed at the age  of 57.  Sees Dr. Betti Cruz for outpatient mental health services and has a  therapist, Audry Pili.   SOCIAL HISTORY:  A 56 year old female.  She is married.  She lives in  [place], originally from [place].  Moved down here recently and  enjoys living in [place].  She has no children.  She is on  disability.   FAMILY HISTORY:  Believes her mother has bipolar disorder.   ALCOHOL/DRUG HISTORY:  She is a nonsmoker.  Denies any alcohol or drug  use.   PRIMARY CARE Boden Stucky:  [facility].   MEDICAL PROBLEMS:  1. Non-insulin-dependent diabetes.  2. Hypertension.   MEDICATIONS:  1. Depakote.  2. Geodon 180 mg.  3. Lexapro 20.  4. Avandia 2 mg b.i.d.  5. Benazepril 10 mg for hypertension.   DRUG ALLERGIES:  PREDNISONE, states she has problems getting   irrational.   PHYSICAL EXAMINATION:  GENERAL:  This is a middle-aged female fully  assessed at [facility].  Physical exam was  reviewed.  Of note, states that the patient was talking about not  wanting to live anymore.  VITAL SIGNS:  Temperature is 97.6, 96 heart rate, 18 respirations, blood  pressure is 159/94.   LABORATORY DATA:  Her laboratory data shows a BMET that was within  normal limits.  Urinalysis is negative.  Urine drug screen is positive  for benzodiazepines, positive for opiates, hemoglobin 14.3 with  hematocrit of 42.   MENTAL STATUS EXAM:  She is a fully alert, cooperative female, good eye  contact, casually dressed.  Her speech is clear, bright, mood is  pleasant, grateful for people listening to her and getting her back on  her medications.  Her affect is bright.  Her insight is good.  Her  thought processes are coherent and goal directed.  No evidence of any  delusional statements.   AXIS I:  Bipolar disorder.  AXIS II:  Deferred.  AXIS III:  Hypertension and  diabetes.  AXIS IV:  Psychosocial problems related to burden of illness.  AXIS V:  Current is 40.   PLAN:  Contract for safety.  We will resume her medications.  We will  check her CBG.  We will get a follow up appointment with Dr. Betti Cruz.  We  will contact her husband for any concerns and support.  Her tentative  length of stay is 3-5 days.      Landry Corporal, N.P.      Anselm Jungling, MD  Electronically Signed    JO/MEDQ  D:  01/27/2008  T:  01/27/2008  Job:  [id number]

## 2010-09-01 ENCOUNTER — Other Ambulatory Visit: Payer: Self-pay | Admitting: Family Medicine

## 2010-09-01 DIAGNOSIS — Z1231 Encounter for screening mammogram for malignant neoplasm of breast: Secondary | ICD-10-CM

## 2010-09-08 ENCOUNTER — Ambulatory Visit: Payer: Medicare Other

## 2010-09-12 ENCOUNTER — Ambulatory Visit
Admission: RE | Admit: 2010-09-12 | Discharge: 2010-09-12 | Disposition: A | Discharge status: Home / Self Care | Payer: Medicare Other | Source: Ambulatory Visit | Attending: Family Medicine | Admitting: Family Medicine

## 2010-09-12 DIAGNOSIS — Z1231 Encounter for screening mammogram for malignant neoplasm of breast: Secondary | ICD-10-CM

## 2010-09-26 ENCOUNTER — Ambulatory Visit: Payer: Medicare Other | Admitting: Dietician

## 2010-09-29 ENCOUNTER — Ambulatory Visit: Payer: Medicare Other | Admitting: Dietician

## 2010-10-10 ENCOUNTER — Inpatient Hospital Stay (HOSPITAL_COMMUNITY)
Admission: AD | Admit: 2010-10-10 | Discharge: 2010-10-13 | DRG: 885 | Disposition: A | Discharge status: Home / Self Care | Payer: Medicare Other | Attending: Psychiatry | Admitting: Psychiatry

## 2010-10-10 ENCOUNTER — Emergency Department (HOSPITAL_COMMUNITY)
Admission: EM | Admit: 2010-10-10 | Discharge: 2010-10-10 | Disposition: A | Discharge status: Other Acute Inpatient Hospital | Payer: Medicare Other | Source: Home / Self Care | Attending: Emergency Medicine | Admitting: Emergency Medicine

## 2010-10-10 ENCOUNTER — Emergency Department (HOSPITAL_COMMUNITY): Payer: Medicare Other

## 2010-10-10 DIAGNOSIS — F3289 Other specified depressive episodes: Secondary | ICD-10-CM | POA: Insufficient documentation

## 2010-10-10 DIAGNOSIS — F3160 Bipolar disorder, current episode mixed, unspecified: Principal | ICD-10-CM

## 2010-10-10 DIAGNOSIS — F329 Major depressive disorder, single episode, unspecified: Secondary | ICD-10-CM | POA: Insufficient documentation

## 2010-10-10 DIAGNOSIS — Z79899 Other long term (current) drug therapy: Secondary | ICD-10-CM | POA: Insufficient documentation

## 2010-10-10 DIAGNOSIS — IMO0002 Reserved for concepts with insufficient information to code with codable children: Secondary | ICD-10-CM

## 2010-10-10 DIAGNOSIS — I10 Essential (primary) hypertension: Secondary | ICD-10-CM

## 2010-10-10 DIAGNOSIS — F1011 Alcohol abuse, in remission: Secondary | ICD-10-CM

## 2010-10-10 DIAGNOSIS — E119 Type 2 diabetes mellitus without complications: Secondary | ICD-10-CM | POA: Insufficient documentation

## 2010-10-10 DIAGNOSIS — F1911 Other psychoactive substance abuse, in remission: Secondary | ICD-10-CM

## 2010-10-10 DIAGNOSIS — R45851 Suicidal ideations: Secondary | ICD-10-CM

## 2010-10-10 LAB — COMPREHENSIVE METABOLIC PANEL
ALT: 123 U/L — ABNORMAL HIGH (ref 0–35)
AST: 64 U/L — ABNORMAL HIGH (ref 0–37)
CO2: 29 mEq/L (ref 19–32)
Calcium: 9.9 mg/dL (ref 8.4–10.5)
GFR calc non Af Amer: >60 mL/min (ref >60)
Sodium: 133 mEq/L — ABNORMAL LOW (ref 135–145)

## 2010-10-10 LAB — CBC
HCT: 40.6 % (ref 36.0–46.0)
MCHC: 32 g/dL (ref 30.0–36.0)
MCV: 95.8 fL (ref 78.0–100.0)
RDW: 13.6 % (ref 11.5–15.5)
WBC: 5.9 10*3/uL (ref 4.0–10.5)

## 2010-10-10 LAB — DIFFERENTIAL
Eosinophils Relative: 1 % (ref 0–5)
Lymphocytes Relative: 39 % (ref 12–46)
Lymphs Abs: 2.3 10*3/uL (ref 0.7–4.0)
Monocytes Absolute: 0.3 10*3/uL (ref 0.1–1.0)
Monocytes Relative: 6 % (ref 3–12)

## 2010-10-10 LAB — ETHANOL: Alcohol, Ethyl (B): <11 mg/dL (ref 0–11)

## 2010-10-10 LAB — RAPID URINE DRUG SCREEN, HOSP PERFORMED
Barbiturates: NOT DETECTED
Benzodiazepines: NOT DETECTED

## 2010-10-10 LAB — GLUCOSE, CAPILLARY: Glucose-Capillary: 176 mg/dL — ABNORMAL HIGH (ref 70–99)

## 2010-10-11 DIAGNOSIS — F319 Bipolar disorder, unspecified: Secondary | ICD-10-CM

## 2010-10-12 LAB — GLUCOSE, CAPILLARY: Glucose-Capillary: 135 mg/dL — ABNORMAL HIGH (ref 70–99)

## 2010-10-12 NOTE — Assessment & Plan Note (Signed)
NAME:  VALDES-JEFFERS, Chrishana        ACCOUNT NO.:  [id number]  MEDICAL RECORD NO.:  [id number]  LOCATION:                                FACILITY:  [facility]  PHYSICIAN:  Franchot Gallo, MD     DATE OF BIRTH:  [birth year]  DATE OF ADMISSION: DATE OF DISCHARGE:                      PSYCHIATRIC ADMISSION ASSESSMENT   CHIEF COMPLAINT:  "I am really not sure why I am here."  HISTORY OF PRESENT ILLNESS:  Ms. Pell is a 55 year old married black female who was admitted to [facility] under involuntary commitment order after she contacted a suicide hotline.  The patient states that she called a suicide hotline to obtain information about possible support groups in the area.  She states that she was told to go to a local emergency room for information, and when she presented to [facility], she was sent to [facility] under the holding order. On interview, the patient states that she has been having some difficulty initiating and maintaining sleep.  She reports an increased appetite as well as mild feelings of sadness, anhedonia and depressed mood.  She states that she was experiencing some vague suicidal ideations but with no plan or intent, nor does she report any past attempts to harm herself.  Currently, she denies any suicidal ideations or homicidal ideations.  The patient also reports a history of anxiety symptoms and took the medication Klonopin until approximately 3 years ago when she discontinued the medication.  She states that her anxiety symptoms were under good control with the Klonopin, but she stopped it due to feeling she did not need it any longer.  The patient does have a history of bipolar disorder and states that without medication, she will have hypomanic episodes where she maxed on her credit cards.  She states that this occurred on two occasions when she purchased things that she does not need.  She also states that she will fluctuate between  hypomania and depression.  However, with her current medications.  She feels that her symptoms are under good control.  She does report, however, feels the medication Abilify may not be helping as well as it has in the past, and often this medication is substituted for Geodon.  She reports that prior to admission she was given Geodon on 1 occasion and felt that this medication worked better as a mood stabilizer.  The patient also reports a history of polysubstance abuse.  She states that she began to abuse alcohol when she was 55 years of age as well as cocaine in her 80s.  She states that 1 year ago she began to abuse prescription pain medications and Seroquel.  She denies any recent use of alcohol or illicit drugs.  She denies any past or current auditory or visual hallucinations or delusional thinking.  She presents for evaluation of the above symptoms as well as treatment recommendations.  PAST PSYCHIATRIC HISTORY:  The patient has 3 past psychiatric hospitalizations to [facility], first in 2009, secondary in 2010 and last January 2012.  She reports that she sees a therapist at [facility] and also sees a Engineer, building services.  PAST MEDICAL HISTORY:  CURRENT MEDICATIONS: 1. Abilify 10 mg p.o. q.a.m. 2. Lisinopril 40 mg p.o.  q.a.m. 3. Trazodone 50 mg p.o. q.h.s. 4. Meloxicam 15 mg p.o. q.a.m. 5. Glipizide ER 10 mg p.o. q.a.m. 6. Benadryl 25 mg p.m. q.6 h as needed for allergies. 7. Multivitamin p.o. q.a.m. 8. Lexapro 10 mg p.o. q.a.m. 9. Lamictal 200 mg p.o. q.a.m. 10.Fish oil 1 capsule p.o. q.a.m. 11.Cyclobenzaprine 10 mg p.o. t.i.d. - p.r.n. for muscle spasms.  ALLERGIES: 1. Prednisone - "makes manic." 2. Metformin - "does not work for me.".  MEDICAL ILLNESSES: 1. Non-insulin-dependent diabetes mellitus. 2. Hypertension. 3. Reported herniated disk of the lumbar spine.  PAST OPERATIONS:  Right hysterectomy related to a benign  tumor.  FAMILY HISTORY:  The patient states that her maternal cousin is bipolar and that her mother is likely bipolar.  She reports that several maternal uncles and cousins are alcoholics.  SOCIAL HISTORY:  The patient states that she was born and raised in [place] and moved to [place], [place] 4 years ago.  She currently lives in [place] with her husband.  She states that she has no children.  The patient denies any use of tobacco products, but states that she did abuse alcohol starting at age 65 as well as cocaine starting in the 4s.  She also states that she abuse prescription medications 1 year ago including narcotic pain medication and Seroquel. She denies any recent use of illicit drugs or alcohol.  The patient reports that she completed three semesters of college and is currently receiving disability for her bipolar disorder.  MENTAL STATUS EXAM:  GENERAL:  The patient was alert and oriented x3 and was friendly and cooperative with this provider.  Speech was appropriate in terms of rate and volume.  Mood appeared mildly depressed.  Affect was slightly constricted.  Thoughts:  The patient denied any obvious delusions or hallucinations, nor does she report any suicidal or homicidal ideations.  Judgment and insight both appeared fair to good.  IMPRESSION:   AXIS I: 1. Bipolar disorder, mixed, currently fair to good control. 2. Polysubstance abuse of alcohol, cocaine and opiate, currently in     remission times 6 months. AXIS II:  Deferred. AXIS III:  Please see medical history above. AXIS IV:  Chronic mental illness. AXIS V:  GAF at time of admission approximately 45.  Highest GAF in past year approximately 60.  PLAN: 1. The patient was restarted on the medication Lexapro at 10 mg p.o.     q.a.m. to continue to address her depressive symptoms. 2. The patient was continued on the medication lamotrigine at 200 mg     p.o. q.a.m. as a mood stabilizer. 3. The  patient was continued on the medication trazodone at 50 mg p.o.     q.h.s. - p.r.n. for sleep. 4. The medication Abilify was not restarted and the patient was     started on Geodon at 80 mg p.o. q.h.s. to provide additional     stabilization. 5. The patient will be continued on her non psychiatric medications as     listed above. 6. The patient will be monitored for dangerousness to self and/or     others. 7. The patient will be removed from her holding order and allowed to     sign for voluntary care. 8. The patient will participate in group activities per routine.    ____________________________________ Franchot Gallo, MD     RR/MEDQ  D:  10/11/2010  T:  10/12/2010  Job:  [id number]  Electronically Signed by Franchot Gallo MD on 10/12/2010 11:32:25 AM

## 2010-10-13 LAB — GLUCOSE, CAPILLARY: Glucose-Capillary: 114 mg/dL — ABNORMAL HIGH (ref 70–99)

## 2010-10-19 NOTE — Discharge Summary (Signed)
NAME:  Diana Watson, Diana Watson        ACCOUNT NO.:  [id number]  MEDICAL RECORD NO.:  [id number]  LOCATION:  0507                          FACILITY:  BH  PHYSICIAN:  Franchot Gallo, MD     DATE OF BIRTH:  11/15/1955  DATE OF ADMISSION:  10/10/2010 DATE OF DISCHARGE:  10/13/2010                              DISCHARGE SUMMARY   REASON FOR ADMISSION:  The patient reports under involuntary commitment after she contacted a suicide hotline.  She had called the hotline to obtain information about possible support groups in the area and was told to report to the emergency room for further information.  She was reporting problems with initiating and maintaining sleep and feelings of sadness and depressed mood.  She did report that she had some vague suicidal thoughts but no plan or intent and never had any past attempts.  FINAL IMPRESSION:   Axis I:  Bipolar disorder, mixed, currently fair to good control.  Polysubstance abuse of alcohol, cocaine and opiates. Currently in remission for 6 months. Axis II:  Deferred. Axis III:  History of non-insulin diabetes, hypertension, herniated disk. Axis IV:  Chronic mental illness. Axis V:  GAF is 60.  PERTINENT LABS:  Sodium is 133.  Alcohol level less than 11.  Urine drug screen was negative.  CBC within normal limits.  Her blood sugars were elevated to 175 in the emergency room.  SIGNIFICANT FINDINGS:  The patient was admitted to the adult milieu for safety and stabilization.  We restarted her back on her Lexapro to address her depressive symptoms and continue her Lamictal.  Her mood stabilized after trazodone for sleep.  We also initiated Geodon at 80 mg to provide additional stabilization.  The patient was attending discharge planning group.  She was endorsing good sleep and good appetite, having mild depressive symptoms, rating at a 1 on a scale of 1- 10.  Denied any suicidal or homicidal thoughts.  Her anxiety was resolved, rating it a 0  and having no medication side effects.  We contacted the patient's husband to address any safety issues and for [place] to provide information on suicide risk factors and suicide prevention and interventions.  On day of discharge the patient's sleep was good. Her appetite was good.  Her depression had resolved, rating it a 0 on a scale of 1-10, and denied any suicidal or homicidal thoughts.  Denied any auditory or visual hallucinations.  Her anxiety was resolved, rating it a 0, having no medication side effects.  DISCHARGE MEDICATIONS: 1. Vistaril 50 mg one q.h.s. sleep. 2. Geodon 80 mg one q.h.s. 3. Flexeril 10 mg t.i.d. p.r.n. 4. Fish oil daily. 5. Glipizide ER 10 mg daily. 6. Lamictal 200 mg for mood daily. 7. Lexapro 10 mg depression. 8. Lisinopril 40 mg for blood pressure. 9. Meloxicam as directed. 10.Multivitamin daily. 11.Trazodone 50 mg for sleep.  The patient was to stop taking her Abilify.  Her followup appointment was with Jill Poling at [facility], phone number [phone] on Thursday July 19 at 1 p.m. and with Aurea Graff at phone number [phone] on August 30 at 2 p.m.     Landry Corporal, N.P.   ______________________________ Franchot Gallo, MD    JO/MEDQ  D:  10/18/2010  T:  10/19/2010  Job:  [id number]  Electronically Signed by Limmie Patricia.P. on 10/19/2010 12:15:51 PM Electronically Signed by Franchot Gallo MD on 10/19/2010 04:39:53 PM

## 2010-10-29 DIAGNOSIS — G4733 Obstructive sleep apnea (adult) (pediatric): Secondary | ICD-10-CM | POA: Insufficient documentation

## 2011-01-01 LAB — CBC
MCV: 97.3
Platelets: 199
WBC: 7

## 2011-01-01 LAB — POCT I-STAT, CHEM 8
Creatinine, Ser: 1.2
Hemoglobin: 14.3
Potassium: 4.2
Sodium: 138

## 2011-01-01 LAB — URINALYSIS, ROUTINE W REFLEX MICROSCOPIC
Bilirubin Urine: NEGATIVE
Glucose, UA: NEGATIVE
Ketones, ur: NEGATIVE
pH: 6.5

## 2011-01-01 LAB — DIFFERENTIAL
Basophils Absolute: 0
Eosinophils Absolute: 0.1
Lymphocytes Relative: 36
Lymphs Abs: 2.5
Neutrophils Relative %: 55

## 2011-01-01 LAB — RAPID URINE DRUG SCREEN, HOSP PERFORMED
Benzodiazepines: POSITIVE — AB
Cocaine: NOT DETECTED
Tetrahydrocannabinol: NOT DETECTED

## 2011-01-02 LAB — GLUCOSE, CAPILLARY
Glucose-Capillary: 108 — ABNORMAL HIGH
Glucose-Capillary: 88
Glucose-Capillary: 95

## 2011-01-10 LAB — DIFFERENTIAL
Basophils Absolute: 0.1
Basophils Relative: 2 — ABNORMAL HIGH
Lymphocytes Relative: 45
Neutro Abs: 2.7

## 2011-01-10 LAB — CBC
HCT: 40.1
Hemoglobin: 13.6
MCV: 97.1
Platelets: 299
RBC: 4.13
WBC: 5.9

## 2011-01-10 LAB — COMPREHENSIVE METABOLIC PANEL
Alkaline Phosphatase: 58
BUN: 10
CO2: 27
Chloride: 99
Creatinine, Ser: 1.17
GFR calc non Af Amer: 49 — ABNORMAL LOW
Glucose, Bld: 84
Total Bilirubin: 0.6

## 2011-01-10 LAB — URINALYSIS, ROUTINE W REFLEX MICROSCOPIC
Glucose, UA: NEGATIVE
Hgb urine dipstick: NEGATIVE
Ketones, ur: NEGATIVE
Protein, ur: NEGATIVE

## 2011-01-10 LAB — LIPASE, BLOOD: Lipase: 40

## 2011-01-11 LAB — I-STAT 8, (EC8 V) (CONVERTED LAB)
BUN: 18
Chloride: 107
HCT: 42
Hemoglobin: 14.3
Operator id: 279831
Sodium: 141

## 2011-01-11 LAB — POCT I-STAT CREATININE
Creatinine, Ser: 0.9
Operator id: 279831

## 2011-01-11 LAB — URINALYSIS, ROUTINE W REFLEX MICROSCOPIC
Bilirubin Urine: NEGATIVE
Hgb urine dipstick: NEGATIVE
Specific Gravity, Urine: 1.019
pH: 6

## 2011-04-29 ENCOUNTER — Encounter (HOSPITAL_COMMUNITY): Payer: Self-pay | Admitting: *Deleted

## 2011-04-29 ENCOUNTER — Other Ambulatory Visit: Payer: Self-pay

## 2011-04-29 ENCOUNTER — Inpatient Hospital Stay (HOSPITAL_COMMUNITY)
Admission: RE | Admit: 2011-04-29 | Discharge: 2011-05-01 | DRG: 885 | Disposition: A | Discharge status: Home / Self Care | Payer: Medicare Other | Source: Ambulatory Visit | Attending: Psychiatry | Admitting: Psychiatry

## 2011-04-29 ENCOUNTER — Encounter (HOSPITAL_COMMUNITY): Payer: Self-pay | Admitting: Behavioral Health

## 2011-04-29 ENCOUNTER — Emergency Department (HOSPITAL_COMMUNITY)
Admission: EM | Admit: 2011-04-29 | Discharge: 2011-04-29 | Disposition: A | Discharge status: Home / Self Care | Payer: Medicare Other | Source: Home / Self Care | Attending: Emergency Medicine | Admitting: Emergency Medicine

## 2011-04-29 DIAGNOSIS — I10 Essential (primary) hypertension: Secondary | ICD-10-CM | POA: Insufficient documentation

## 2011-04-29 DIAGNOSIS — F3189 Other bipolar disorder: Principal | ICD-10-CM

## 2011-04-29 DIAGNOSIS — E119 Type 2 diabetes mellitus without complications: Secondary | ICD-10-CM

## 2011-04-29 DIAGNOSIS — F319 Bipolar disorder, unspecified: Secondary | ICD-10-CM | POA: Diagnosis present

## 2011-04-29 DIAGNOSIS — R45851 Suicidal ideations: Secondary | ICD-10-CM

## 2011-04-29 DIAGNOSIS — Z888 Allergy status to other drugs, medicaments and biological substances status: Secondary | ICD-10-CM

## 2011-04-29 DIAGNOSIS — Z79899 Other long term (current) drug therapy: Secondary | ICD-10-CM

## 2011-04-29 DIAGNOSIS — F3130 Bipolar disorder, current episode depressed, mild or moderate severity, unspecified: Secondary | ICD-10-CM | POA: Insufficient documentation

## 2011-04-29 DIAGNOSIS — Z794 Long term (current) use of insulin: Secondary | ICD-10-CM

## 2011-04-29 HISTORY — DX: Reserved for concepts with insufficient information to code with codable children: IMO0002

## 2011-04-29 HISTORY — DX: Essential (primary) hypertension: I10

## 2011-04-29 HISTORY — DX: Bipolar disorder, unspecified: F31.9

## 2011-04-29 LAB — CBC
HCT: 38.5 % (ref 36.0–46.0)
MCHC: 33 g/dL (ref 30.0–36.0)
RDW: 13.2 % (ref 11.5–15.5)

## 2011-04-29 LAB — BASIC METABOLIC PANEL
BUN: 12 mg/dL (ref 6–23)
GFR calc Af Amer: >90 mL/min (ref >90)
GFR calc non Af Amer: 83 mL/min — ABNORMAL LOW (ref >90)
Potassium: 4.2 mEq/L (ref 3.5–5.1)
Sodium: 136 mEq/L (ref 135–145)

## 2011-04-29 LAB — RAPID URINE DRUG SCREEN, HOSP PERFORMED
Barbiturates: NOT DETECTED
Cocaine: NOT DETECTED

## 2011-04-29 MED ORDER — ALUM & MAG HYDROXIDE-SIMETH 200-200-20 MG/5ML PO SUSP: 30.0000 mL | ORAL | Status: DC | PRN

## 2011-04-29 MED ORDER — LAMOTRIGINE 200 MG PO TABS: 200.0000 mg | ORAL_TABLET | Freq: Every day | ORAL | Status: DC

## 2011-04-29 MED ORDER — MAGNESIUM HYDROXIDE 400 MG/5ML PO SUSP: 30.0000 mL | Freq: Every day | ORAL | Status: DC | PRN

## 2011-04-29 MED ORDER — ACETAMINOPHEN 325 MG PO TABS: 650.0000 mg | ORAL_TABLET | Freq: Four times a day (QID) | ORAL | Status: DC | PRN

## 2011-04-29 MED ORDER — CYCLOBENZAPRINE HCL 10 MG PO TABS
10.0000 mg | ORAL_TABLET | Freq: Three times a day (TID) | ORAL | Status: DC | PRN
  Administered 2011-04-29: 10 mg via ORAL
  Filled 2011-04-29: qty 1

## 2011-04-29 MED ORDER — ACETAMINOPHEN 325 MG PO TABS: 650.0000 mg | ORAL_TABLET | ORAL | Status: DC | PRN

## 2011-04-29 MED ORDER — IBUPROFEN 200 MG PO TABS: 600.0000 mg | ORAL_TABLET | Freq: Three times a day (TID) | ORAL | Status: DC | PRN

## 2011-04-29 MED ORDER — ADULT MULTIVITAMIN W/MINERALS CH: 1.0000 | ORAL_TABLET | Freq: Every day | ORAL | Status: DC

## 2011-04-29 MED ORDER — VITAMIN D3 25 MCG (1000 UNIT) PO TABS
2000.0000 [IU] | ORAL_TABLET | Freq: Every day | ORAL | Status: DC
Start: 2011-04-30 — End: 2011-04-29

## 2011-04-29 MED ORDER — INSULIN ASPART 100 UNIT/ML ~~LOC~~ SOLN: 0.0000 [IU] | Freq: Three times a day (TID) | SUBCUTANEOUS | Status: DC

## 2011-04-29 MED ORDER — LISINOPRIL 40 MG PO TABS
40.0000 mg | ORAL_TABLET | Freq: Every day | ORAL | Status: DC
Start: 2011-04-30 — End: 2011-04-29

## 2011-04-29 MED ORDER — ESCITALOPRAM OXALATE 10 MG PO TABS
10.0000 mg | ORAL_TABLET | Freq: Every day | ORAL | Status: DC
Start: 2011-04-30 — End: 2011-04-29

## 2011-04-29 MED ORDER — CLONAZEPAM 1 MG PO TABS
2.0000 mg | ORAL_TABLET | Freq: Once | ORAL | Status: AC
  Administered 2011-04-29: 2 mg via ORAL
  Filled 2011-04-29: qty 2

## 2011-04-29 MED ORDER — INSULIN ASPART 100 UNIT/ML ~~LOC~~ SOLN
0.0000 [IU] | Freq: Three times a day (TID) | SUBCUTANEOUS | Status: DC
  Administered 2011-04-30 – 2011-05-01 (×3): 1 [IU] via SUBCUTANEOUS
  Administered 2011-05-01: 2 [IU] via SUBCUTANEOUS
  Filled 2011-04-29: qty 3

## 2011-04-29 NOTE — [facility] Counselor (Signed)
Spoke to nurse 2x and EKG is complete and Tylenol level has been drawn but not posted.  Nurse has spoken to Poison Control 2x and case is closed per nurse.  Nurse will document findings.  AC at [facility] was wanting an update as [facility] is considering pt for admission is awaiting tests.

## 2011-04-29 NOTE — ED Notes (Signed)
Pt has sleep apnea and uses machine at night- inquiring about needs for this evening regarding her need for her machine or the hospitals

## 2011-04-29 NOTE — Tx Team (Signed)
Initial Interdisciplinary Treatment Plan  PATIENT STRENGTHS: (choose at least two) Ability for insight Average or above average intelligence Communication skills Motivation for treatment/growth Religious Affiliation Supportive family/friends  PATIENT STRESSORS: Health problems   PROBLEM LIST: Problem List/Patient Goals Date to be addressed Date deferred Reason deferred Estimated date of resolution  Anhedonia      Depression      Suicide Risk      Concurrent Medical                                     DISCHARGE CRITERIA:  Ability to meet basic life and health needs Adequate post-discharge living arrangements Improved stabilization in mood, thinking, and/or behavior Medical problems require only outpatient monitoring Need for constant or close observation no longer present Reduction of life-threatening or endangering symptoms to within safe limits Verbal commitment to aftercare and medication compliance  PRELIMINARY DISCHARGE PLAN: Attend aftercare/continuing care group Attend PHP/IOP  PATIENT/FAMIILY INVOLVEMENT: This treatment plan has been presented to and reviewed with the patient, Diana Watson, and/or family member,   The patient and family have been given the opportunity to ask questions and make suggestions.  Barbette Merino, [place] Shari Prows 04/29/2011, 8:57 PM

## 2011-04-29 NOTE — ED Notes (Signed)
CBG will be checked prior to lunch. Pt will also have insulin based on cbg score. Pt aware lunch will be served around 1PM

## 2011-04-29 NOTE — ED Notes (Signed)
Pt in no apparent distress in front of nursing desk, Charge nurse aware patient needs a sitter, but no sitter is available.

## 2011-04-29 NOTE — BH Assessment (Signed)
Assessment Note   Diana Watson is an 56 y.o. female.   Pt presents with suicidal ideation, plan and intent to end life by overdose or walking into traffic.  Pt reports "I have struggled with Bi-Polar for a long time and I just don't want to live anymore."  Pt reports her husband, Janalyn Rouse, is very supportive and "keeps me going."  Pt has been in recovery for 15 years on cocaine and 10 yrs from alcohol use.  Pt denies intent to commit homicide, but reports "I feel like hurting someone.  I am irritated and angry and I can't help it."  Pt also reports being inconsistent with her psych meds "I take them when I feel like and right now, I don't feel like."  Pt uses pharmacy [facility] on [address].  Pt does report she wants help and "does not want to feel this way."  Pt overdosed last night on psych meds - Geodon and Vistril.  Pt reports she did not tell her husband what she did but he "knew something was wrong and called EMS."   Pt has prior hx of psych placement with the last admit 7/12 at [facility].  Pt can perform ADLs and appears cooperative though crying constantly during assessment.  Pt made fair eye contact but displays poor judgement and cannot contract reliably for safety.  Recommend Inptx.    Axis I: Bipolar, Depressed Axis II: Deferred Axis III:  Past Medical History  Diagnosis Date  . Bipolar 1 disorder   . Herniated disc   . Diabetes mellitus   . Hypertension    Axis IV: other psychosocial or environmental problems and problems related to social environment Axis V: 31-40 impairment in reality testing  Past Medical History:  Past Medical History  Diagnosis Date  . Bipolar 1 disorder   . Herniated disc   . Diabetes mellitus   . Hypertension     Past Surgical History  Procedure Date  . Right oophorectomy     Also removed a tumor at that time.  . Dilation and curettage of uterus     Family History: History reviewed. No pertinent family history.  Social History:   reports that she has never smoked. She has never used smokeless tobacco. She reports that she does not drink alcohol or use illicit drugs.  Additional Social History:  Alcohol / Drug Use Pain Medications: na Prescriptions: na Over the Counter: na History of alcohol / drug use?: Yes (pt reports being clean for 10 & 15 yrs off etoh and cocaine ) Longest period of sobriety (when/how long): 15 yrs - current off cocaine and 10 yrs current off alcohol per pt Negative Consequences of Use: Personal relationships (pt in recovery for 10 and 15 yrs per pt) Allergies:  Allergies  Allergen Reactions  . Prednisone Anxiety    Home Medications:  Medications Prior to Admission  Medication Dose Route Frequency Provider Last Rate Last Dose  . acetaminophen (TYLENOL) tablet 650 mg  650 mg Oral Q4H PRN Lyanne Co, MD      . ibuprofen (ADVIL,MOTRIN) tablet 600 mg  600 mg Oral Q8H PRN Lyanne Co, MD      . insulin aspart (novoLOG) injection 0-15 Units  0-15 Units Subcutaneous TID WC Lyanne Co, MD       No current outpatient prescriptions on file as of 04/29/2011.    OB/GYN Status:  No LMP recorded. Patient is postmenopausal.  General Assessment Data Location of Assessment: [facility] ED  ACT Assessment: Yes Living Arrangements: Spouse/significant other Can pt return to current living arrangement?: Yes Admission Status: Voluntary Is patient capable of signing voluntary admission?: Yes Transfer from: Acute Hospital Referral Source: MD  Education Status Is patient currently in school?: No  Risk to self Suicidal Ideation: Yes-Currently Present Suicidal Intent: Yes-Currently Present Is patient at risk for suicide?: Yes Suicidal Plan?: Yes-Currently Present Specify Current Suicidal Plan: overdose or jump into traffic Access to Means: Yes Specify Access to Suicidal Means: can get meds and can walk What has been your use of drugs/alcohol within the last 12 months?: in recovery for 10 yrs alcohol  and 15 yrs cocaine Previous Attempts/Gestures: Yes How many times?: 4  Other Self Harm Risks: no Triggers for Past Attempts: Unpredictable Intentional Self Injurious Behavior: None Family Suicide History: Yes Recent stressful life event(s): Conflict (Comment) Persecutory voices/beliefs?: No Depression: Yes Depression Symptoms: Tearfulness;Isolating;Fatigue;Guilt;Loss of interest in usual pleasures;Feeling worthless/self pity;Feeling angry/irritable Substance abuse history and/or treatment for substance abuse?: No Suicide prevention information given to non-admitted patients: Not applicable  Risk to Others Homicidal Ideation: Yes-Currently Present Thoughts of Harm to Others: Yes-Currently Present Comment - Thoughts of Harm to Others: no identified, just irritable and "think I could hurt someone" Current Homicidal Intent: No-Not Currently/Within Last 6 Months Current Homicidal Plan: No-Not Currently/Within Last 6 Months Access to Homicidal Means: No (may be able to get access to knife if needed) Identified Victim: no History of harm to others?: No Assessment of Violence: None Noted Violent Behavior Description: 0 Does patient have access to weapons?: No Criminal Charges Pending?: No Does patient have a court date: No  Psychosis Hallucinations: None noted Delusions: None noted  Mental Status Report Appear/Hygiene: Disheveled Eye Contact: Fair Motor Activity: Restlessness Speech: Logical/coherent Level of Consciousness: Alert Mood: Depressed;Anxious;Suspicious;Irritable;Sad;Worthless, low self-esteem Affect: Anxious;Depressed;Frightened;Sad;Sullen Anxiety Level: Severe Thought Processes: Relevant;Coherent Judgement: Impaired Orientation: Person;Place;Situation Obsessive Compulsive Thoughts/Behaviors: Minimal  Cognitive Functioning Concentration: Decreased Memory: Recent Intact;Remote Intact IQ: Average Insight: Fair Impulse Control: Poor Appetite: Poor Weight Loss:  30  (6 mths ago) Weight Gain: 10  Sleep: Increased Total Hours of Sleep: 12  Vegetative Symptoms: Decreased grooming  Prior Inpatient Therapy Prior Inpatient Therapy: Yes Prior Therapy Dates: 7/12 Prior Therapy Facilty/Provider(s): [facility] Reason for Treatment: SI  Prior Outpatient Therapy Prior Outpatient Therapy: Yes Prior Therapy Dates: current [facility] closed doors 04-27-11) Prior Therapy Facilty/Provider(s): [facility] Reason for Treatment: depression and Bi-Polar; med mgt  ADL Screening (condition at time of admission) Patient's cognitive ability adequate to safely complete daily activities?: Yes Patient able to express need for assistance with ADLs?: Yes Independently performs ADLs?: Yes Weakness of Legs: None Weakness of Arms/Hands: None  Home Assistive Devices/Equipment Home Assistive Devices/Equipment: None  Therapy Consults (therapy consults require a physician order) PT Evaluation Needed: No OT Evalulation Needed: No SLP Evaluation Needed: No Abuse/Neglect Assessment (Assessment to be complete while patient is alone) Physical Abuse: Denies Verbal Abuse: Denies Sexual Abuse: Denies Exploitation of patient/patient's resources: Denies Self-Neglect: Denies Values / Beliefs Cultural Requests During Hospitalization: Other (comment) (pt verbalized the "Black" culture expels those need psych tx) Spiritual Requests During Hospitalization: None Consults Spiritual Care Consult Needed: No Social Work Consult Needed: No Merchant navy officer (For Healthcare) Advance Directive: Patient does not have advance directive Pre-existing out of facility DNR order (yellow form or pink MOST form): No Nutrition Screen Unintentional weight loss greater than 10lbs within the last month: Yes (Comment) (pt reports eating brings her comfort; lost 30lbs ago) Dysphagia: No Home  Tube Feeding or Total Parenteral Nutrition (TPN): No Patient appears severely  malnourished: No Pregnant or Lactating: No Dietitian Consult Needed: No  Additional Information 1:1 In Past 12 Months?: No CIRT Risk: No Elopement Risk: No Does patient have medical clearance?: Yes     Disposition:   Pt to be reviewed for a possible 500 Hall Bed at [facility]  Disposition Disposition of Patient: Inpatient treatment program Type of inpatient treatment program: Adult  On Site Evaluation by:   Reviewed with Physician:     Titus Mould, Eppie Gibson 04/29/2011 12:32 PM

## 2011-04-29 NOTE — ED Provider Notes (Signed)
Accepted for admission to behavioral health. She was transferred in good condition.  Cyndra Numbers, MD 04/29/11 [phone]

## 2011-04-29 NOTE — BHH Counselor (Signed)
Pt accepted to [facility] by Dr. Elsie Saas to Dr. Dan Humphreys to room 503-1.  Nurse in ED is to call [facility] and give report - [phone].  Per [facility], pt is ready to be transported and can go by security to [facility] if ED MD is in agreement.

## 2011-04-29 NOTE — ED Notes (Signed)
Pt inquiring about pain med and diabetic meal- informed this pt about labs pending  And would instruct unit secretary about meal- pt understood- pt remains alert and active with care

## 2011-04-29 NOTE — ED Notes (Signed)
Pt EKG faxed for ACT team. Tylenol results given verbally to [facility].

## 2011-04-29 NOTE — ED Notes (Signed)
Poison control closed account with patient. Recheck completed approximately one hour ago

## 2011-04-29 NOTE — ED Notes (Signed)
ACT reports he is going to work on getting the patient transferred to [facility]. Telepsych not indicated at this time. Pt aware of plan and in no distress.

## 2011-04-29 NOTE — ED Notes (Signed)
Bobby from ACT reports patient has been accepted to [facility] and he must finish her paperwork. Pt aware of plan of care and is aware her meal tray is here. Meal tray will be given after blood sugar recheck

## 2011-04-29 NOTE — ED Notes (Signed)
Poison control contacted and spoke with Beth.  They recommend monitoring for 4 to 6 hours, checking lab work and EKG. Also give fluids depending on lab work and check a tylenol level.  Too late to do treatment and care should be supportive

## 2011-04-29 NOTE — ED Notes (Signed)
Patient attached back to the monitor after getting up to restroom. Pt in no distress with family at bedside

## 2011-04-29 NOTE — Progress Notes (Signed)
Patient ID: Alis Sawchuk, female   DOB: 1955-08-29, 56 y.o.   MRN: [id number] This is a 56 yr old female admitted voluntarily after an OD on 5 Vistaril and 5 80 mg Geodon. Pt states that she has been increasingly anxious and withdrawn over the past 5 months but she does not know why. Pt c/o anhedonia, crying spells, forgetfulness and depression. Pt was being managed through [facility] but states that she has no follow up plan since their closure. Pt is mood is labile and her affect is tearful. Pt states that she does not want to be around people and that she does not want to be hear now. Pt also refused to sign the [place] agreement. Writer explained to pt that [facility] is an excellent means of addressing her many stressors in a safe environment. Pt responds positively to encouragement but remains tearful and apprehensive. However, pt is cooperative with staff. Pt stopped attending church 1 month ago as well. Pt uses a CPAP machine at home which she did not bring with her. Pt denies SI/HI and AVH at this time and claims that she did not wish to commit suicide but that she just needs help. Pt also states that she has a hx of sleep walking and actually left her apartment once in her sleep. Pt is a fall risk due to hx. Pt oriented to the unit and handbook provided. 15 minute checks initiated for safety.

## 2011-04-29 NOTE — ED Provider Notes (Signed)
History     CSN: [id number]  Arrival date & time 04/29/11  7829   First MD Initiated Contact with Patient 04/29/11 [phone]      Chief Complaint  Patient presents with  . V70.1    (Consider location/radiation/quality/duration/timing/severity/associated sxs/prior treatment) The history is provided by the patient.   patient reports worsening symptoms of depression over the last month.  She's had increased thoughts of suicide.  She has to plans.  The first plan is to jump in front of a car.  A second plan is to take all her sleeping pills.  She has not acted on any of these plans.  Her last admission to behavioral health was in July of 2012 at which point she was admitted for depression suicidal thoughts.  She reports intermittent compliance with her medications.  She does have a history of diabetes.  She reports her blood sugars have been normal.  She denies ingestion.  She denies homicidal thoughts.  Her underlying mental health diagnosis is bipolar 1 disorder.  Nothing worsens her symptoms.  Nothing improves her symptoms.  Her symptoms are constant and worsening.  Past Medical History  Diagnosis Date  . Bipolar 1 disorder   . Herniated disc   . Diabetes mellitus   . Hypertension     Past Surgical History  Procedure Date  . Right oophorectomy     Also removed a tumor at that time.  . Dilation and curettage of uterus     History reviewed. No pertinent family history.  History  Substance Use Topics  . Smoking status: Never Smoker   . Smokeless tobacco: Never Used  . Alcohol Use: No    OB History    Grav Para Term Preterm Abortions TAB SAB Ect Mult Living                  Review of Systems  All other systems reviewed and are negative.    Allergies  Prednisone  Home Medications   Current Outpatient Rx  Name Route Sig Dispense Refill  . VITAMIN D 1000 UNITS PO TABS Oral Take 2,000 Units by mouth daily.    . CYCLOBENZAPRINE HCL 10 MG PO TABS Oral Take 10 mg by mouth  3 (three) times daily as needed. For muscle spasms    . ESCITALOPRAM OXALATE 10 MG PO TABS Oral Take 10 mg by mouth daily.    Marland Kitchen GLIPIZIDE 10 MG PO TABS Oral Take 10 mg by mouth 2 (two) times daily before a meal.    . GLIPIZIDE 5 MG PO TABS Oral Take 5 mg by mouth every evening.    Marland Kitchen HYDROXYZINE PAMOATE 25 MG PO CAPS Oral Take 25 mg by mouth at bedtime.    Marland Kitchen LAMOTRIGINE 200 MG PO TABS Oral Take 200 mg by mouth daily.    Marland Kitchen LISINOPRIL 40 MG PO TABS Oral Take 40 mg by mouth daily.    . ADULT MULTIVITAMIN W/MINERALS CH Oral Take 1 tablet by mouth daily.    . OMEGA-3-ACID ETHYL ESTERS 1 G PO CAPS Oral Take 2 g by mouth 2 (two) times daily.    Marland Kitchen POLYETHYLENE GLYCOL 3350 PO PACK Oral Take 17 g by mouth daily.    Marland Kitchen ZIPRASIDONE HCL 80 MG PO CAPS Oral Take 80 mg by mouth 2 (two) times daily with a meal.      BP 135/80  Pulse 81  Temp(Src) 97.6 F (36.4 C) (Oral)  Resp 18  Ht 5\' 4"  (1.626 m)  Wt 200 lb (90.719 kg)  BMI 34.33 kg/m2  SpO2 100%  Physical Exam  Nursing note and vitals reviewed. Constitutional: She is oriented to person, place, and time. She appears well-developed and well-nourished. No distress.  HENT:  Head: Normocephalic and atraumatic.  Eyes: EOM are normal.  Neck: Normal range of motion.  Cardiovascular: Normal rate, regular rhythm and normal heart sounds.   Pulmonary/Chest: Effort normal and breath sounds normal.  Abdominal: Soft. She exhibits no distension. There is no tenderness.  Musculoskeletal: Normal range of motion.  Neurological: She is alert and oriented to person, place, and time.  Skin: Skin is warm and dry.  Psychiatric:       Suicidal ideation    ED Course  Procedures (including critical care time)   Labs Reviewed  CBC  BASIC METABOLIC PANEL  ETHANOL  URINE RAPID DRUG SCREEN (HOSP PERFORMED)  POCT CBG MONITORING   No results found.   No diagnosis found.    MDM  Patient with increasing depression suicidal thoughts.  We'll work on placement  for this patient.  Will discuss the case with the act team        Lyanne Co, MD 04/29/11 2049

## 2011-04-29 NOTE — ED Notes (Signed)
ACT with patient

## 2011-04-29 NOTE — ED Notes (Addendum)
Pt reports taking 6 25mg  Vistaril, 6 80mg  Geodon and unknown number of Benadryl, pt is drowsy at present but oriented. Pt also reports that she would have taken more but could not find anymore.

## 2011-04-29 NOTE — ED Notes (Signed)
Pt reports taking Geodon and Visteral three days worth slowly over the night starting at 1AM and stopping at 3AM.  Pt reports she took this medications in an attempt to hurt herself.  Pt reports thinking about SI before, but has never tried to commit suicide before this.  Geodon was 80mg  usually qhs (Unsure how many she took last night maybe 3 to 4 tablets) Visteral was 25mg  BID (Unsure how many she took, maybe 6 tablets) Pt unsure exactly how many tablets she took last night and says she got confused after awhile.  Pt reports history of bipolar disorder and reports she is tired of feeling better and then getting sick again. Pt reports she has no control over her bipolar disorder.  Pt reports she is on disability for bipolar and that upsets her because she wants to work, but is sick too often.  Pt reports chronic low back pain from herniated disc but otherwise no pain.  Pt denies nausea at this time.   Pt took her normal 6AM medications - lamictal, lexapro, lisinopril, glipizide

## 2011-04-29 NOTE — ED Notes (Signed)
Pt from home c/o increased depressed feelings with SI including plan of walking in front of a car or taking an overdose of sleeping pills, these feelings began to get worse in October 2012 but increased even more over the last month.

## 2011-04-30 DIAGNOSIS — F319 Bipolar disorder, unspecified: Secondary | ICD-10-CM | POA: Diagnosis present

## 2011-04-30 DIAGNOSIS — F3160 Bipolar disorder, current episode mixed, unspecified: Secondary | ICD-10-CM

## 2011-04-30 LAB — GLUCOSE, CAPILLARY
Glucose-Capillary: 75 mg/dL (ref 70–99)
Glucose-Capillary: 126 mg/dL — ABNORMAL HIGH (ref 70–99)
Glucose-Capillary: 102 mg/dL — ABNORMAL HIGH (ref 70–99)
Glucose-Capillary: 138 mg/dL — ABNORMAL HIGH (ref 70–99)
Glucose-Capillary: 118 mg/dL — ABNORMAL HIGH (ref 70–99)

## 2011-04-30 LAB — HEMOGLOBIN A1C
Hgb A1c MFr Bld: 7.1 % — ABNORMAL HIGH (ref <5.7)
Mean Plasma Glucose: 157 mg/dL — ABNORMAL HIGH (ref <117)

## 2011-04-30 MED ORDER — CHLORPROMAZINE HCL 25 MG PO TABS
ORAL_TABLET | ORAL | Status: AC
  Administered 2011-04-30: 25 mg via ORAL
  Filled 2011-04-30: qty 1

## 2011-04-30 MED ORDER — LAMOTRIGINE 100 MG PO TABS
200.0000 mg | ORAL_TABLET | Freq: Every day | ORAL | Status: DC
  Administered 2011-04-30 – 2011-05-01 (×2): 200 mg via ORAL
  Filled 2011-04-30 (×5): qty 2

## 2011-04-30 MED ORDER — CYCLOBENZAPRINE HCL 10 MG PO TABS
10.0000 mg | ORAL_TABLET | Freq: Two times a day (BID) | ORAL | Status: DC
  Administered 2011-04-30 – 2011-05-01 (×2): 10 mg via ORAL
  Filled 2011-04-30 (×6): qty 1

## 2011-04-30 MED ORDER — CHLORPROMAZINE HCL 25 MG PO TABS
25.0000 mg | ORAL_TABLET | Freq: Four times a day (QID) | ORAL | Status: DC
  Administered 2011-04-30 – 2011-05-01 (×3): 25 mg via ORAL
  Filled 2011-04-30 (×8): qty 1

## 2011-04-30 MED ORDER — ZIPRASIDONE HCL 80 MG PO CAPS
80.0000 mg | ORAL_CAPSULE | Freq: Every day | ORAL | Status: DC
  Administered 2011-04-30: 80 mg via ORAL
  Filled 2011-04-30 (×3): qty 1

## 2011-04-30 MED ORDER — CHLORPROMAZINE HCL 25 MG PO TABS
25.0000 mg | ORAL_TABLET | Freq: Three times a day (TID) | ORAL | Status: DC
  Administered 2011-04-30 (×2): 25 mg via ORAL
  Filled 2011-04-30 (×5): qty 1

## 2011-04-30 NOTE — Progress Notes (Signed)
Pt is resting in bed with eyes closed. RR WNL, even and unlabored. Snoring overheard. Level III obs in place for safety. Pt is safe. Lawrence Marseilles

## 2011-04-30 NOTE — Tx Team (Signed)
Interdisciplinary Treatment Plan Update (Adult)  Date:  04/30/2011  Time Reviewed:  10:23 AM   Progress in Treatment: Attending groups: No, does not like to be around people Participating in groups:  No Taking medication as prescribed:  Yes Tolerating medication: Yes Family/Significant othe contact made:  No, assessing for consent for contact  Patient understands diagnosis: Yes Discussing patient identified problems/goals with staff:  Yes Medical problems stabilized or resolved: Yes Denies suicidal/homicidal ideation: No, has suicidal thoughts currently Issues/concerns per patient self-inventory:  No  Other:  New problem(s) identified: None  Reason for Continuation of Hospitalization: Aggression Depression Medication stabilization Suicidal ideation  Interventions implemented related to continuation of hospitalization:  Medication stabilization, safety checks q 15 mins, group attendance  Additional comments: Current provider of MH services is closing ([facility])  Estimated length of stay: 2-4 days  Discharge Plan: [name] will discharge home with husband and follow up will be set with appropriate provider in [facility]):  Review of initial/current patient goals per problem list:   1.  Goal(s): Decrease depressive symptoms  Met:  No  Target date: by discharge  As evidenced by: [name] will rate depression at 4 or less, down from 10 at admission  2.  Goal (s): Reduce anhedonia  Met:  Yes  Target date: by discharge  As evidenced by: [name] will express plan to restart previously enjoyed activities such as Bible study (reports she will begin to re-incorporate self-care and social activities slowly, as she tried to get back into church recently and became overwhelmed)  3.  Goal(s): Reduce potential for self-harm   Met:  No  Target date: by discharge  As evidenced by: [name] will deny suicidal thoughts, but still endorses SI today  4.  Goal(s):  Reduce anger outbursts   Met:  No  Target date: by disharge  As evidenced by: [name] will deny ideation/urges to "just hit people" and hurt them  Attendees: Patient:  Diana Watson 04/30/2011 10:23 AM  Family:   04/30/2011 10:23 AM  Physician:  Dr Orson Aloe, MD 04/30/2011 10:23 AM  Nursing:   Quintella Reichert, Rn 04/30/2011 10:23 AM  Case Manager:  Juline Patch, LCSW 04/30/2011 10:23 AM  Counselor:  Angus Palms, LCSW 04/30/2011 10:23 AM  Other:  Reyes Ivan, LCSWA 04/30/2011 10:23 AM  Other:  Delanna Ahmadi, RN  04/30/2011 10:23 AM  Other:  Serena Colonel, NP 04/30/2011 10:23 AM  Other:  Charlyne Mom, doctoral psych intern 04/30/2011 10:23 AM   Scribe for Treatment Team:   [name], 04/30/2011 10:23 AM

## 2011-04-30 NOTE — Progress Notes (Signed)
Pt has been labile in mood. Hostile and angry. Pt stated she felt "like throwing a chair", and also felt like "hitting someone". Pt has been aggressive in speech and attitude. Pt was able to calm down and was more cooperative after taking medication. Denies s.i. C/o back pain. Order written for flexeril. Husband brought cpap machine, visited at lunch. Pt says she mostly just wants to be alone.

## 2011-04-30 NOTE — H&P (Signed)
Psychiatric Admission Assessment Adult  Patient Identification:  Diana Watson Date of Evaluation:  04/30/2011 Chief Complaint:  BIPOLAR D/O,NOS  History of Present Illness:: [name] is a 56 year old African-American female, admitted to [facility] from the [facility] ED with complaints of suicidal ideations. Patient reports, "I think about killing myself. I am currently thinking about fighting, hurting somebody. I am irritated, so angry that I can't help myself.  I also think about what it will feel like to not be in this world any more. I am normally a social person. However, for the past few weeks, I have been isolalting myself from everything that used to matter. I am irritated and inpatient with people. I did not attend the groups held this morning because I could not tolerate and or deal with people. I can tolerated may be 1 or 2 people, beyond that I cannot handle. I have been thinking and fantasizing about killing myself. May be take an overdose of pills and or walk out in front of a moving truck and end it right there. Right now, I am not enjoying anything or my life. It started in September of last year. I developed this feeling of not wanting to be around people. Even with my husband, we are living like room mates. We live in an apartment, sometimes I don't come out of the bedroom. It is like I was not even there with him. I cancelled my bible studies that I used to enjoy. I have crying spells and mood swings. I took an overdose of all my medicines that were in my medication weekly planner. But I handed to my husband all the bottles of my medicines. Even though I did that and did not tell him, he still sensed that I had done something stupid and he called 911 and I was taken to the emergency room"  Mood Symptoms:  Anhedonia, Depression, Mood Swings, Past 2 Weeks, Sadness, SI, Depression Symptoms:  depressed mood, feelings of worthlessness/guilt, recurrent thoughts of  death, suicidal thoughts with specific plan, suicidal attempt, (Hypo) Manic Symptoms:  Irritable Mood, Anxiety Symptoms:  Social Anxiety, Psychotic Symptoms:  Hallucinations: None  PTSD Symptoms: "My fiance committed suicide a long time ago"  Past Psychiatric History: Diagnosis: Bipolar disorder, mixed  Hospitalizations: [facility]  Outpatient Care: [facility]   Substance Abuse Care: Hx. Cocaine abuse 15 years, Recovering alcoholic 15 years  Self-Mutilation: Denies  Suicidal Attempts: "yes, by overdose"  Violent Behaviors: None reported.   Past Medical History:   Past Medical History  Diagnosis Date  . Bipolar 1 disorder   . Herniated disc   . Diabetes mellitus   . Hypertension    None. Allergies:   Allergies  Allergen Reactions  . Prednisone Anxiety   PTA Medications: Prescriptions prior to admission  Medication Sig Dispense Refill  . cholecalciferol (VITAMIN D) 1000 UNITS tablet Take 2,000 Units by mouth daily.      Marland Kitchen escitalopram (LEXAPRO) 10 MG tablet Take 10 mg by mouth daily.      Marland Kitchen glipiZIDE (GLUCOTROL) 5 MG tablet Take 5 mg by mouth every evening.      . hydrOXYzine (VISTARIL) 25 MG capsule Take 25 mg by mouth at bedtime.      . lamoTRIgine (LAMICTAL) 200 MG tablet Take 200 mg by mouth daily.      Marland Kitchen lisinopril (PRINIVIL,ZESTRIL) 40 MG tablet Take 40 mg by mouth daily.      . Multiple Vitamin (MULITIVITAMIN WITH MINERALS) TABS Take  1 tablet by mouth daily.      Marland Kitchen omega-3 acid ethyl esters (LOVAZA) 1 G capsule Take 2 g by mouth 2 (two) times daily.      . polyethylene glycol (MIRALAX / GLYCOLAX) packet Take 17 g by mouth daily.        Previous Psychotropic Medications:  Medication/Dose                 Substance Abuse History in the last 12 months: Substance Age of 1st Use Last Use Amount Specific Type  Nicotine "I don't smoke"     Alcohol Denies current use 10 years sobriety.     Cannabis Denies use     Opiates Denies use     Cocaine Denies  current use use 15 years clean.      Methamphetamines Denies use     LSD Denies use     Ecstasy Denies use     Benzodiazepines Denies use     Caffeine      Inhalants      Others:                         Consequences of Substance Abuse: Medical Consequences:  Liver damage, death by overdose Legal Consequences:  Arrests/jail time, Loss of driving privilages Family Consequences:  Family discord  Social History: Current Place of Residence: [place]   Place of Birth:  [place] Family Members: Husband Marital Status:  Married Children:  Sons:0  Daughters:0 Relationships: with husband Education:  HS Print production planner Problems/Performance: None reported Religious Beliefs/Practices: None reported History of Abuse (Emotional/Phsycial/Sexual): None reported Occupational Experiences: disabled, receives [employer] History:  None. Legal History: None reported  Family History:   Family History  Problem Relation Age of Onset  . Diabetes type II Mother   . Hypertension Mother   . Diabetes type II Father   . Hypertension Father     Mental Status Examination/Evaluation: Objective:  Appearance: Casual and Fairly Groomed  Patent attorney::  Fair  Speech:  Clear and Coherent and Slow  Volume:  Normal  Mood:  Angry, Depressed, Hopeless and Irritable  Affect:  Blunt and Flat  Thought Process:  Coherent  Orientation:  Full  Thought Content:  Hallucinations: None  Suicidal Thoughts:  Yes.  without intent/plan  Homicidal Thoughts:  No  Memory:  Immediate;   Good Recent;   Good Remote;   Good  Judgement:  Fair  Insight:  Fair  Psychomotor Activity:  Normal  Concentration:  Fair  Recall:  Good  Akathisia:  No  Handed:  Right  AIMS (if indicated):     Assets:  Desire for Improvement  Sleep:  Number of Hours: 5.75            Assessment:    AXIS I:  Bipolar, mixed AXIS II:  Deferred AXIS III:   Past Medical History  Diagnosis Date  . Bipolar 1 disorder   .  Herniated disc   . Diabetes mellitus   . Hypertension    AXIS IV:  Disabled AXIS V:  11-20 some danger of hurting self or others possible OR occasionally fails to maintain minimal personal hygiene OR gross impairment in communication  Treatment Plan/Recommendations: Admit for safety and stabilizations.  Review and reinstate any pertinent home medications.                                                                Group counseling and activities.                                                                 Obtain urinalysis.  Treatment Plan Summary: Daily contact with patient to assess and evaluate symptoms and progress in treatment Medication management Current Medications:  Current Facility-Administered Medications  Medication Dose Route Frequency Provider Last Rate Last Dose  . acetaminophen (TYLENOL) tablet 650 mg  650 mg Oral Q6H PRN Verne Spurr, PA      . alum & mag hydroxide-simeth (MAALOX/MYLANTA) 200-200-20 MG/5ML suspension 30 mL  30 mL Oral Q4H PRN Verne Spurr, PA      . clonazePAM Scarlette Calico) tablet 2 mg  2 mg Oral Once Nehemiah Settle, MD   2 mg at 04/29/11 2154  . insulin aspart (novoLOG) injection 0-9 Units  0-9 Units Subcutaneous TID WC Verne Spurr, PA   1 Units at 04/30/11 0615  . magnesium hydroxide (MILK OF MAGNESIA) suspension 30 mL  30 mL Oral Daily PRN Verne Spurr, PA       Facility-Administered Medications Ordered in Other Encounters  Medication Dose Route Frequency Provider Last Rate Last Dose  . DISCONTD: acetaminophen (TYLENOL) tablet 650 mg  650 mg Oral Q4H PRN Lyanne Co, MD      . DISCONTD: acetaminophen (TYLENOL) tablet 650 mg  650 mg Oral Q6H PRN Nehemiah Settle, MD      . DISCONTD: alum & mag hydroxide-simeth (MAALOX/MYLANTA) 200-200-20 MG/5ML suspension 30 mL  30 mL Oral Q4H PRN Nehemiah Settle, MD      . DISCONTD: cholecalciferol (VITAMIN D)  tablet 2,000 Units  2,000 Units Oral Daily Nehemiah Settle, MD      . DISCONTD: cyclobenzaprine (FLEXERIL) tablet 10 mg  10 mg Oral TID PRN Nehemiah Settle, MD   10 mg at 04/29/11 1652  . DISCONTD: escitalopram (LEXAPRO) tablet 10 mg  10 mg Oral Daily Nehemiah Settle, MD      . DISCONTD: ibuprofen (ADVIL,MOTRIN) tablet 600 mg  600 mg Oral Q8H PRN Lyanne Co, MD      . DISCONTD: insulin aspart (novoLOG) injection 0-15 Units  0-15 Units Subcutaneous TID WC Lyanne Co, MD      . DISCONTD: lamoTRIgine (LAMICTAL) tablet 200 mg  200 mg Oral Daily Nehemiah Settle, MD      . DISCONTD: lisinopril (PRINIVIL,ZESTRIL) tablet 40 mg  40 mg Oral Daily Nehemiah Settle, MD      . DISCONTD: magnesium hydroxide (MILK OF MAGNESIA) suspension 30 mL  30 mL Oral Daily PRN Nehemiah Settle, MD      . DISCONTD: mulitivitamin with minerals tablet 1 tablet  1 tablet Oral Daily Nehemiah Settle, MD        Observation Level/Precautions:  Q 15 minutes checks for safety  Laboratory:  Obtain vitamin D  levels.  Psychotherapy:  Group  Medications:  See lists  Routine PRN Medications:  Yes  Consultations:  None indicated  Discharge Concerns: Safety  Other:     Armandina Stammer I 1/28/20139:58 AM

## 2011-04-30 NOTE — Progress Notes (Signed)
[facility] Group Notes: (Counselor/Nursing/MHT/Case Management/Adjunct) 04/30/2011   @1 :15pm  Type of Therapy:  Group Therapy  Participation Level:  Did not attend    Diana Watson is not leaving her room, as she is very agitated and fears that if she is around others she will be unable to control her anxiety and result in hitting someone.     [name] 04/30/2011 3:35 PM
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
## 2011-04-30 NOTE — Progress Notes (Signed)
[facility] Adult Inpatient Family/Significant Other Suicide Prevention Education  Suicide Prevention Education:  Education Completed; Domingo Cocking (Eddie) Teacher, music, husband, has been identified by the patient as the family member/significant other with whom the patient will be residing, and identified as the person(s) who will aid the patient in the event of a mental health crisis (suicidal ideations/suicide attempt).  With written consent from the patient, the family member/significant other has been provided the following suicide prevention education, prior to the and/or following the discharge of the patient.  The suicide prevention education provided includes the following:  Suicide risk factors  Suicide prevention and interventions  National Suicide Hotline telephone number  [facility] assessment telephone number  [facility] Emergency Assistance 911  [facility] and/or Residential Mobile Crisis Unit telephone number  Request made of family/significant other to:  Remove weapons (e.g., guns, rifles, knives), all items previously/currently identified as safety concern.    Remove drugs/medications (over-the-counter, prescriptions, illicit drugs), all items previously/currently identified as a safety concern.  Eddie reports he has no safety concerns for Diana Watson being discharged. He stated that he is with her all day and will monitor both her mood and medication. Eddie verbalized understanding of suicide prevention information, including risk factors, warning signs and contact information for crisis resources. He had no further questions on this subject.   [name] 04/30/2011, 3:24 PM
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
## 2011-04-30 NOTE — Progress Notes (Signed)
Patient seen during treatment team.  She reports admitting to hospital due to having Bipolar disorder and becoing SI prior to admission.  She advised she had been seen outpatient by [facility] but they are closing and she will need a referral for outpatient services.  She reports having home and assess to medications.  She currently denies SI/HI.  She reports being very angry and not wanting to be in the hospital or around people. Patient shared that she fears that she will  She rates depression at eight/nine, anxiety at ten, and helplessness at four.  She also endorses a loss of interest in activities she once enjoyed.  She advised of not going out alone but will go out if husband is with her.   MD to start medication and possibly discharge home later today.  Per State Regulation 482.30 This chart was reviewed for medical necessity with respect to the patient's  Admission/Duration of Stay  [facility], LCSW @1 /28/2013    Next Review Date 05/03/11

## 2011-04-30 NOTE — Progress Notes (Signed)
[facility] Group Notes:  (Counselor/Nursing/MHT/Case Management/Adjunct) 11:00am   Type of Therapy:  Group Therapy  Participation Level:  Did Not Attend  (group facilitated by Charlyne Mom, doctoral psych intern)  [name] 04/30/2011  3:35 PM

## 2011-05-01 MED ORDER — VITAMIN D 1000 UNITS PO TABS: 2000.0000 [IU] | ORAL_TABLET | Freq: Every day | ORAL | Status: DC

## 2011-05-01 MED ORDER — CYCLOBENZAPRINE HCL 5 MG PO TABS: 5.0000 mg | ORAL_TABLET | Freq: Three times a day (TID) | ORAL | Status: AC

## 2011-05-01 MED ORDER — ADULT MULTIVITAMIN W/MINERALS CH: 1.0000 | ORAL_TABLET | Freq: Every day | ORAL | Status: DC

## 2011-05-01 MED ORDER — GLIPIZIDE 5 MG PO TABS: 5.0000 mg | ORAL_TABLET | Freq: Every evening | ORAL | Status: DC

## 2011-05-01 MED ORDER — LAMOTRIGINE 200 MG PO TABS: 200.0000 mg | ORAL_TABLET | Freq: Every day | ORAL | Status: DC

## 2011-05-01 MED ORDER — CHLORPROMAZINE HCL 25 MG PO TABS: 25.0000 mg | ORAL_TABLET | Freq: Four times a day (QID) | ORAL | Status: DC

## 2011-05-01 MED ORDER — ZIPRASIDONE HCL 80 MG PO CAPS: 80.0000 mg | ORAL_CAPSULE | Freq: Every day | ORAL | Status: DC

## 2011-05-01 MED ORDER — POLYETHYLENE GLYCOL 3350 17 G PO PACK: 17.0000 g | PACK | Freq: Every day | ORAL | Status: DC

## 2011-05-01 MED ORDER — CYCLOBENZAPRINE HCL 10 MG PO TABS
5.0000 mg | ORAL_TABLET | Freq: Three times a day (TID) | ORAL | Status: DC
  Administered 2011-05-01: 5 mg via ORAL
  Filled 2011-05-01 (×6): qty 0.5

## 2011-05-01 MED ORDER — HYDROXYZINE PAMOATE 25 MG PO CAPS: 25.0000 mg | ORAL_CAPSULE | Freq: Every day | ORAL | Status: DC

## 2011-05-01 MED ORDER — OMEGA-3-ACID ETHYL ESTERS 1 G PO CAPS: 2.0000 g | ORAL_CAPSULE | Freq: Two times a day (BID) | ORAL | Status: DC

## 2011-05-01 MED ORDER — ESCITALOPRAM OXALATE 10 MG PO TABS: 10.0000 mg | ORAL_TABLET | Freq: Every day | ORAL | Status: DC

## 2011-05-01 MED ORDER — LISINOPRIL 40 MG PO TABS: 40.0000 mg | ORAL_TABLET | Freq: Every day | ORAL | Status: DC

## 2011-05-01 NOTE — Progress Notes (Signed)
[facility] Group Notes: (Counselor/Nursing/MHT/Case Management/Adjunct) 05/01/2011   @  11:00am   Type of Therapy:  Group Therapy  Participation Level:  Active  Participation Quality:  Attentive, Appropriate, Sharing  Affect:  Appropriate  Cognitive:  Appropriate  Insight:  Good  Engagement in Group: Good  Engagement in Therapy:  Good  Modes of Intervention:  Support and Exploration  Summary of Progress/Problems:  Diana Watson reflected on how much of her time and energy and self she has given to focusing unhelpful thoughts or feelings. She identified that although she does not consider it unhelpful, she spent 80% of her time and energy trying not to harm herself or others when she could have reduced that significantly by seeking help earlier. Diana Watson explored her thoughts and feelings that kept her from seeking help, and stated that she needs to be humble enough to accept help instead of trying to do everything on her own.   [name] 05/01/2011 2:32 PM

## 2011-05-01 NOTE — Progress Notes (Signed)
Patient ID: Diana Watson, female   DOB: 25-Oct-1955, 56 y.o.   MRN: [id number] Pt refused to go to group stating that she was informed that by her Dr to wait until he started her on her meds, because without them she gets angry and lashes out at people. Support and encouragement was offered.

## 2011-05-01 NOTE — Discharge Summary (Signed)
Physician Discharge Summary Note  Patient:  Diana Watson is an 56 y.o., female MRN:  [id number] DOB:  [birth year] Patient phone:  [phone] (home)  Patient address:   [address] [place] [address],   Date of Admission:  04/29/2011 Date of Discharge: 04/30/28  Discharge Diagnoses: Principal Problem:  *Bipolar affective disorder, rapid cycling   Axis Diagnosis:   AXIS I:  Bipolar affective disorder, rapid cycling AXIS II:  Deferred AXIS III:   Past Medical History  Diagnosis Date  . Bipolar 1 disorder   . Herniated disc   . Diabetes mellitus   . Hypertension    AXIS IV:  No changes AXIS V:  61-70 mild symptoms  Level of Care:  OP  Hospital Course:  Diana Watson is a 56 year old African-American female, admitted to [facility] from the [facility] ED with complaints of suicidal ideations. Patient reports, "I think about killing myself. I am currently thinking about fighting, hurting somebody. I am irritated, so angry that I can't help myself. I also think about what it will feel like to not be in this world any more. I am normally a social person. However, for the past few weeks, I have been isolalting myself from everything that used to matter. I am irritated and inpatient with people. I did not attend the groups held this morning because I could not tolerate and or deal with people. I can tolerated may be 1 or 2 people, beyond that I cannot handle.  While a patient in this hospital, patient received medication managements. She was continued on Geodon with addition of thorazine to help with increased agitation and irritability. She gradually respond to treatment as evidenced by her reports of decreased agitation and irritability. She is currently being discharged to her home with husband with all personal belongings. She is to follow-up on an outpatient care basis [facility] [employer]. The address, time and date of  appointment provided for patient.    Consults:  None  Significant Diagnostic Studies:  None  Discharge Vitals:   Blood pressure 129/81, pulse 76, temperature 97.1 F (36.2 C), temperature source Oral, resp. rate 18, height 5\' 4"  (1.626 m), weight 90.266 kg (199 lb), SpO2 98.00%.  Mental Status Exam: See Mental Status Examination and Suicide Risk Assessment completed by Attending Physician prior to discharge.  Discharge destination:  Home  Is patient on multiple antipsychotic therapies at discharge:  Yes,   Do you recommend tapering to monotherapy for antipsychotics?  No   Has Patient had three or more failed trials of antipsychotic monotherapy by history:  Yes,   Antipsychotic medications that previously failed include:  Zypreza, Risperdal  Recommended Plan for Multiple Antipsychotic Therapies: Additional reason(s) for multiple antispychotic treatment:  For much better control of symptoms and stabilization.   Medication List  As of 05/01/2011  4:32 PM   START taking these medications         chlorproMAZINE 25 MG tablet   Commonly known as: THORAZINE   Take 1 tablet (25 mg total) by mouth 4 (four) times daily. For anxiety      cyclobenzaprine 5 MG tablet   Commonly known as: FLEXERIL   Take 1 tablet (5 mg total) by mouth 3 (three) times daily. For back spasms      ziprasidone 80 MG capsule   Commonly known as: GEODON   Take 1 capsule (80 mg total) by mouth at bedtime. For mood control  CHANGE how you take these medications         cholecalciferol 1000 UNITS tablet   Commonly known as: VITAMIN D   Take 2 tablets (2,000 Units total) by mouth daily. For Vitamin D replacement and mood control   What changed: doctor's instructions      escitalopram 10 MG tablet   Commonly known as: LEXAPRO   Take 1 tablet (10 mg total) by mouth daily. For depression   What changed: doctor's instructions      glipiZIDE 5 MG tablet   Commonly known as: GLUCOTROL   Take 1 tablet (5  mg total) by mouth every evening. For control of blood sugar   What changed: doctor's instructions      hydrOXYzine 25 MG capsule   Commonly known as: VISTARIL   Take 1 capsule (25 mg total) by mouth at bedtime. For insomnia   What changed: doctor's instructions      lamoTRIgine 200 MG tablet   Commonly known as: LAMICTAL   Take 1 tablet (200 mg total) by mouth daily. For mood control   What changed: doctor's instructions      lisinopril 40 MG tablet   Commonly known as: PRINIVIL,ZESTRIL   Take 1 tablet (40 mg total) by mouth daily. For control of high blood pressure   What changed: doctor's instructions      mulitivitamin with minerals Tabs   Take 1 tablet by mouth daily. For nutritional replacement   What changed: doctor's instructions      omega-3 acid ethyl esters 1 G capsule   Commonly known as: LOVAZA   Take 2 capsules (2 g total) by mouth 2 (two) times daily. For brain health and mood control   What changed: doctor's instructions      polyethylene glycol packet   Commonly known as: MIRALAX / GLYCOLAX   Take 17 g by mouth daily. For constipation   What changed: doctor's instructions          Where to get your medications    These are the prescriptions that you need to pick up. We sent them to a specific pharmacy, so you will need to go there to get them.   [id number] - [place], [place] - [address] AT [facility] [address]    [address] [place] [address]    Phone: [phone]        chlorproMAZINE 25 MG tablet   cholecalciferol 1000 UNITS tablet   cyclobenzaprine 5 MG tablet   escitalopram 10 MG tablet   glipiZIDE 5 MG tablet   hydrOXYzine 25 MG capsule   lamoTRIgine 200 MG tablet   lisinopril 40 MG tablet   mulitivitamin with minerals Tabs   omega-3 acid ethyl esters 1 G capsule   polyethylene glycol packet   ziprasidone 80 MG capsule           Follow-up Information    Follow up with [facility] - [facility]. (Your appointment with Kriste Basque is scheduled for Thursday, May 03, 2011 @ 10:00 a.m.)    Contact information:   [address] [place], [place]  [address]  [phone]         Follow-up recommendations:  Other:  Keep all scheduled  follow-up appointments as recommended  Comments:  Take your medications as prescribed.  SignedArmandina Stammer I 05/01/2011, 4:32 PM

## 2011-05-01 NOTE — Tx Team (Addendum)
Interdisciplinary Treatment Plan Update (Adult)  Date:  05/01/2011  Time Reviewed:  12:11 PM   Progress in Treatment: Attending groups:   Yes   Participating in groups:  Yes Taking medication as prescribed:  Yes Tolerating medication:  Yes Family/Significant othe contact made: [name] allowed contact with her husband Patient understands diagnosis:  Yes Discussing patient identified problems/goals with staff: Yes - Roselee reports medication is working Medical problems stabilized or resolved: Yes Denies suicidal/homicidal ideation:Yes Issues/concerns per patient self-inventory:  Other:  New problem(s) identified:  Reason for Continuation of Hospitalization:  Interventions implemented related to continuation of hospitalization:  Additional comments:  Estimated length of stay:  Discharging home today  Discharge Plan:  Home with outpatient follow up  New goal(s):  Review of initial/current patient goals per problem list:    1.  Goal(s):  Reduce/Eliminate depressive symptoms  Met:  Yes  Target date:  d/c  As evidenced by:  [name] reports depression at one today; rated at ten on admission  2.  Goal (s):  Reduce/Eliminate thoughts of self ham/harm to others  Met:  Yes  Target date:  d/c  As evidenced by:  [name] denies any thoughts of self harms or wanting to harm others  3.  Goal(s):  Reduce/Eliminate anger outburst  Met:  Yes  Target date:  dc   As evidenced by:  Glena is current reporting no thoughts of anger or irritations - Reports medications is working  4.  Goal(s):  Refer for outpatient follow as current provider is no longer providing services  Met:  Yes  Target date: d/c  As evidenced by:  Follow up in place with [facility]  Attendees: Patient:  Diana Watson 05/01/2011  12:11 PM  Family:     Physician:  Orson Aloe, MD 05/01/2011 12:11 PM   Nursing:    05/01/2011 12:11 PM   CaseManager:  Juline Patch, LCSW 05/01/2011 12:11 PM     Counselor:  Angus Palms, LCSW 05/01/2011 12:11 PM   Other:  Consuello Bossier, NP 05/01/2011 12:11 PM   Other:  Reyes Ivan, LCSWA 05/01/2011  12:11 PM   Other:     Other:      Scribe for Treatment Team:   Wynn Banker, LCSW,  05/01/2011 12:11 PM
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
## 2011-05-01 NOTE — Progress Notes (Signed)
Pt D/C home. Pt rx and follow-up visits were discussed and pt verbalized understanding. Pt denies SI/HI. Pt belongings were returned.

## 2011-05-01 NOTE — Progress Notes (Signed)
[facility] Group Notes:  (Counselor/Nursing/MHT/Case Management/Adjunct)    Type of Therapy:  Group Therapy  Participation Level:  Did Not Attend     [name] 05/01/2011  3:47 PM
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
## 2011-05-01 NOTE — Progress Notes (Signed)
[facility] Case Management Discharge Plan:  Will you be returning to the same living situation after discharge: Yes,  Decarla to discharge home with husband At discharge, do you have transportation home?:Yes,  Patient to arrange transportation with family Do you have the ability to pay for your medications:Yes,  Patient can afford medications  Interagency Information:     Release of information consent forms completed and in the chart;  Patient's signature needed at discharge.  Patient to Follow up at:  Follow-up Information    Follow up with Chrystal Manson Passey - [facility]. (Your appointment with Kriste Basque is scheduled for Thursday, May 03, 2011 @ 10:00 a.m.)    Contact information:   [address] [place], [place]  [address]  [phone]         Patient denies SI/HI:   Yes,  Nyanna denies SI.    Safety Planning and Suicide Prevention discussed:  Yes,  Reviewed with patient individually  Barrier to discharge identified:No.  Summary and Recommendations:  No recommendations.   Wynn Banker 05/01/2011, 2:48 PM

## 2011-05-02 NOTE — Progress Notes (Signed)
Patient Discharge Instructions:  Admission Note Faxed,  05/02/2011 After Visit Summary Faxed,  05/02/2011 Faxed to the [facility] Care provider:  05/02/2011 D/C Summary Note faxed 05/02/2011 Facesheet faxed 05/02/2011   Faxed to [facility] of Care @ [phone]  Wandra Scot, 05/02/2011, 6:06 PM

## 2011-06-06 DIAGNOSIS — E1169 Type 2 diabetes mellitus with other specified complication: Secondary | ICD-10-CM | POA: Insufficient documentation

## 2011-11-11 ENCOUNTER — Encounter (HOSPITAL_COMMUNITY): Payer: Self-pay | Admitting: Emergency Medicine

## 2011-11-11 ENCOUNTER — Emergency Department (HOSPITAL_COMMUNITY)
Admission: EM | Admit: 2011-11-11 | Discharge: 2011-11-11 | Disposition: A | Discharge status: Home / Self Care | Payer: Medicare Other | Attending: Emergency Medicine | Admitting: Emergency Medicine

## 2011-11-11 DIAGNOSIS — I10 Essential (primary) hypertension: Secondary | ICD-10-CM | POA: Insufficient documentation

## 2011-11-11 DIAGNOSIS — R45851 Suicidal ideations: Secondary | ICD-10-CM | POA: Insufficient documentation

## 2011-11-11 DIAGNOSIS — R1013 Epigastric pain: Secondary | ICD-10-CM | POA: Insufficient documentation

## 2011-11-11 DIAGNOSIS — M25519 Pain in unspecified shoulder: Secondary | ICD-10-CM | POA: Insufficient documentation

## 2011-11-11 DIAGNOSIS — E119 Type 2 diabetes mellitus without complications: Secondary | ICD-10-CM | POA: Insufficient documentation

## 2011-11-11 DIAGNOSIS — F319 Bipolar disorder, unspecified: Secondary | ICD-10-CM

## 2011-11-11 DIAGNOSIS — Z79899 Other long term (current) drug therapy: Secondary | ICD-10-CM | POA: Insufficient documentation

## 2011-11-11 LAB — COMPREHENSIVE METABOLIC PANEL
Albumin: 4.3 g/dL (ref 3.5–5.2)
BUN: 29 mg/dL — ABNORMAL HIGH (ref 6–23)
Creatinine, Ser: 1.13 mg/dL — ABNORMAL HIGH (ref 0.50–1.10)
Potassium: 4.5 mEq/L (ref 3.5–5.1)
Total Protein: 7.8 g/dL (ref 6.0–8.3)

## 2011-11-11 LAB — CBC WITH DIFFERENTIAL/PLATELET
Eosinophils Absolute: 0 10*3/uL (ref 0.0–0.7)
Eosinophils Relative: 1 % (ref 0–5)
Lymphs Abs: 2.7 10*3/uL (ref 0.7–4.0)
MCH: 31 pg (ref 26.0–34.0)
MCV: 92.5 fL (ref 78.0–100.0)
Monocytes Absolute: 0.4 10*3/uL (ref 0.1–1.0)
Monocytes Relative: 5 % (ref 3–12)
Platelets: 228 10*3/uL (ref 150–400)
RBC: 4.64 MIL/uL (ref 3.87–5.11)

## 2011-11-11 LAB — ETHANOL: Alcohol, Ethyl (B): <11 mg/dL (ref 0–11)

## 2011-11-11 LAB — RAPID URINE DRUG SCREEN, HOSP PERFORMED
Amphetamines: NOT DETECTED
Benzodiazepines: POSITIVE — AB
Opiates: NOT DETECTED

## 2011-11-11 MED ORDER — LORAZEPAM 1 MG PO TABS: 1.0000 mg | ORAL_TABLET | Freq: Three times a day (TID) | ORAL | Status: DC | PRN

## 2011-11-11 MED ORDER — GLIPIZIDE 10 MG PO TABS
10.0000 mg | ORAL_TABLET | Freq: Two times a day (BID) | ORAL | Status: DC
  Filled 2011-11-11 (×2): qty 1

## 2011-11-11 MED ORDER — ACETAMINOPHEN 325 MG PO TABS: 650.0000 mg | ORAL_TABLET | ORAL | Status: DC | PRN

## 2011-11-11 MED ORDER — LAMOTRIGINE 200 MG PO TABS
200.0000 mg | ORAL_TABLET | Freq: Every day | ORAL | Status: DC
  Filled 2011-11-11: qty 1

## 2011-11-11 MED ORDER — CLONAZEPAM 1 MG PO TABS
1.0000 mg | ORAL_TABLET | Freq: Three times a day (TID) | ORAL | Status: DC
  Administered 2011-11-11: 1 mg via ORAL
  Filled 2011-11-11 (×2): qty 1

## 2011-11-11 MED ORDER — ESCITALOPRAM OXALATE 10 MG PO TABS: 10.0000 mg | ORAL_TABLET | Freq: Every day | ORAL | Status: DC

## 2011-11-11 MED ORDER — ZIPRASIDONE HCL 20 MG PO CAPS: 80.0000 mg | ORAL_CAPSULE | Freq: Every day | ORAL | Status: DC

## 2011-11-11 MED ORDER — ZOLPIDEM TARTRATE 5 MG PO TABS: 5.0000 mg | ORAL_TABLET | Freq: Every evening | ORAL | Status: DC | PRN

## 2011-11-11 MED ORDER — ACARBOSE 25 MG PO TABS
25.0000 mg | ORAL_TABLET | Freq: Three times a day (TID) | ORAL | Status: DC
  Filled 2011-11-11 (×2): qty 1

## 2011-11-11 MED ORDER — LISINOPRIL 40 MG PO TABS
40.0000 mg | ORAL_TABLET | Freq: Every day | ORAL | Status: DC
  Filled 2011-11-11: qty 1

## 2011-11-11 MED ORDER — PIOGLITAZONE HCL 15 MG PO TABS
15.0000 mg | ORAL_TABLET | Freq: Every day | ORAL | Status: DC
  Filled 2011-11-11: qty 1

## 2011-11-11 MED ORDER — ONDANSETRON HCL 4 MG PO TABS: 4.0000 mg | ORAL_TABLET | Freq: Three times a day (TID) | ORAL | Status: DC | PRN

## 2011-11-11 NOTE — ED Notes (Signed)
telepsych in progress 

## 2011-11-11 NOTE — ED Notes (Signed)
Husband into see 

## 2011-11-11 NOTE — ED Notes (Signed)
Pt dc'd w/ writen instruction.  Pt encouraged to follow up as instructed and return for recurrance ot the suicidal toughts/urges.  Pt verbalized understanding.  Belongings returned after dc from unit.

## 2011-11-11 NOTE — ED Notes (Signed)
Pt states that she has been "banging her low back against a wall" to try and hurt herself.  States that she has bruises from it.  C/o low back pain 8/10

## 2011-11-11 NOTE — [facility] Counselor (Signed)
Tele Psych discharging patient to home.  Follow up info given to pt for [facility].

## 2011-11-11 NOTE — ED Notes (Signed)
[facility] into see 

## 2011-11-11 NOTE — ED Provider Notes (Addendum)
History     CSN: [id number]  Arrival date & time 11/11/11  4098   First MD Initiated Contact with Patient 11/11/11 1011      Chief Complaint  Patient presents with  . Suicidal    (Consider location/radiation/quality/duration/timing/severity/associated sxs/prior treatment) HPI Comments: She also complains of left shoulder pain that is worse with movement only. She has epigastric pain that she has had in the past. No N, V or fever.   Patient is a 56 y.o. female presenting with mental health disorder. The history is provided by the patient.  Mental Health Problem The primary symptoms include dysphoric mood.  The degree of incapacity that she is experiencing as a consequence of her illness is moderate. Additional symptoms of the illness include abdominal pain. Associated symptoms comments: Patient feels depressed, "out of control", has racing thoughts, feels suicidal. No attempt. . She admits to suicidal ideas. She does not have a plan to commit suicide. She contemplates harming herself. She does not contemplate injuring another person. Risk factors that are present for mental illness include a history of mental illness.    Past Medical History  Diagnosis Date  . Bipolar 1 disorder   . Herniated disc   . Diabetes mellitus   . Hypertension     Past Surgical History  Procedure Date  . Right oophorectomy     Also removed a tumor at that time.  . Dilation and curettage of uterus     Family History  Problem Relation Age of Onset  . Diabetes type II Mother   . Hypertension Mother   . Diabetes type II Father   . Hypertension Father     History  Substance Use Topics  . Smoking status: Never Smoker   . Smokeless tobacco: Never Used  . Alcohol Use: No    OB History    Grav Para Term Preterm Abortions TAB SAB Ect Mult Living                  Review of Systems  Constitutional: Negative for fever.  Respiratory: Negative for shortness of breath.   Cardiovascular: Negative  for chest pain.  Gastrointestinal: Positive for abdominal pain. Negative for nausea and vomiting.  Musculoskeletal:       C/o shoulder pain.  Psychiatric/Behavioral: Positive for dysphoric mood.    Allergies  Prednisone  Home Medications   Current Outpatient Rx  Name Route Sig Dispense Refill  . ACARBOSE 25 MG PO TABS Oral Take 25 mg by mouth 3 (three) times daily with meals.    . CHLORPROMAZINE HCL 25 MG PO TABS Oral Take 50 mg by mouth 2 (two) times daily as needed.    Marland Kitchen VITAMIN D 1000 UNITS PO TABS Oral Take 2,000 Units by mouth daily. For Vitamin D replacement and mood control    . CLONAZEPAM 1 MG PO TABS Oral Take 1 mg by mouth 3 (three) times daily.    Marland Kitchen ESCITALOPRAM OXALATE 10 MG PO TABS Oral Take 10 mg by mouth daily. For depression    . GLIPIZIDE 5 MG PO TABS Oral Take 10 mg by mouth 2 (two) times daily before a meal. For control of blood sugar    . HYDROXYZINE PAMOATE 25 MG PO CAPS Oral Take 25 mg by mouth at bedtime. For insomnia    . LAMOTRIGINE 200 MG PO TABS Oral Take 200 mg by mouth daily. For mood control    . LISINOPRIL 40 MG PO TABS Oral Take 40 mg by mouth  daily. For control of high blood pressure    . ADULT MULTIVITAMIN W/MINERALS CH Oral Take 1 tablet by mouth daily.    Marland Kitchen NAPROXEN 500 MG PO TABS Oral Take 500 mg by mouth 3 (three) times daily with meals.    . OMEGA-3-ACID ETHYL ESTERS 1 G PO CAPS Oral Take 2 g by mouth 2 (two) times daily. For brain health and mood control    . PIOGLITAZONE HCL 15 MG PO TABS Oral Take 15 mg by mouth daily.    Marland Kitchen ZIPRASIDONE HCL 80 MG PO CAPS Oral Take 80 mg by mouth at bedtime.    . CHLORPROMAZINE HCL 25 MG PO TABS Oral Take 1 tablet (25 mg total) by mouth 4 (four) times daily. For anxiety 120 tablet 0  . ZIPRASIDONE HCL 80 MG PO CAPS Oral Take 1 capsule (80 mg total) by mouth at bedtime. For mood control 30 capsule 0    BP 133/66  Pulse 80  Temp 97.5 F (36.4 C) (Oral)  Resp 18  SpO2 100%  Physical Exam  Constitutional:  She appears well-developed and well-nourished.  HENT:  Head: Normocephalic.  Neck: Normal range of motion. Neck supple.  Cardiovascular: Normal rate and regular rhythm.   Pulmonary/Chest: Effort normal and breath sounds normal.  Abdominal: Soft. Bowel sounds are normal. There is no rebound and no guarding.       Mild epigastric tenderness without guarding.   Musculoskeletal: Normal range of motion.       Mildly tender left shoulder with FROM. No swelling or discoloration.  Neurological: She is alert. No cranial nerve deficit.  Skin: Skin is warm and dry. No rash noted.  Psychiatric: She has a normal mood and affect.    ED Course  Procedures (including critical care time)   Labs Reviewed  CBC WITH DIFFERENTIAL  COMPREHENSIVE METABOLIC PANEL  ETHANOL  URINE RAPID DRUG SCREEN (HOSP PERFORMED)  ACETAMINOPHEN LEVEL  SALICYLATE LEVEL   Results for orders placed during the hospital encounter of 11/11/11  CBC WITH DIFFERENTIAL      Component Value Range   WBC 8.0  4.0 - 10.5 K/uL   RBC 4.64  3.87 - 5.11 MIL/uL   Hemoglobin 14.4  12.0 - 15.0 g/dL   HCT 16.1  09.6 - 04.5 %   MCV 92.5  78.0 - 100.0 fL   MCH 31.0  26.0 - 34.0 pg   MCHC 33.6  30.0 - 36.0 g/dL   RDW 40.9  81.1 - 91.4 %   Platelets 228  150 - 400 K/uL   Neutrophils Relative 60  43 - 77 %   Neutro Abs 4.7  1.7 - 7.7 K/uL   Lymphocytes Relative 35  12 - 46 %   Lymphs Abs 2.7  0.7 - 4.0 K/uL   Monocytes Relative 5  3 - 12 %   Monocytes Absolute 0.4  0.1 - 1.0 K/uL   Eosinophils Relative 1  0 - 5 %   Eosinophils Absolute 0.0  0.0 - 0.7 K/uL   Basophils Relative 0  0 - 1 %   Basophils Absolute 0.0  0.0 - 0.1 K/uL  COMPREHENSIVE METABOLIC PANEL      Component Value Range   Sodium 134 (*) 135 - 145 mEq/L   Potassium 4.5  3.5 - 5.1 mEq/L   Chloride 97  96 - 112 mEq/L   CO2 26  19 - 32 mEq/L   Glucose, Bld 110 (*) 70 - 99 mg/dL   BUN 29 (*)  6 - 23 mg/dL   Creatinine, Ser 0.27 (*) 0.50 - 1.10 mg/dL   Calcium 25.3   8.4 - 10.5 mg/dL   Total Protein 7.8  6.0 - 8.3 g/dL   Albumin 4.3  3.5 - 5.2 g/dL   AST 75 (*) 0 - 37 U/L   ALT 129 (*) 0 - 35 U/L   Alkaline Phosphatase 107  39 - 117 U/L   Total Bilirubin 0.2 (*) 0.3 - 1.2 mg/dL   GFR calc non Af Amer 54 (*) >90 mL/min   GFR calc Af Amer 63 (*) >90 mL/min  ETHANOL      Component Value Range   Alcohol, Ethyl (B) <11  0 - 11 mg/dL  URINE RAPID DRUG SCREEN (HOSP PERFORMED)      Component Value Range   Opiates NONE DETECTED  NONE DETECTED   Cocaine NONE DETECTED  NONE DETECTED   Benzodiazepines POSITIVE (*) NONE DETECTED   Amphetamines NONE DETECTED  NONE DETECTED   Tetrahydrocannabinol NONE DETECTED  NONE DETECTED   Barbiturates NONE DETECTED  NONE DETECTED  ACETAMINOPHEN LEVEL      Component Value Range   Acetaminophen (Tylenol), Serum <15.0  10 - 30 ug/mL  SALICYLATE LEVEL      Component Value Range   Salicylate Lvl <2.0 (*) 2.8 - 20.0 mg/dL  GLUCOSE, CAPILLARY      Component Value Range   Glucose-Capillary 87  70 - 99 mg/dL   Comment 1 Notify RN           MDM  Plan is to medically clear and consult [facility] for SI.  Medical screening examination/treatment/procedure(s) were conducted as a shared visit with non-physician practitioner(s) and myself.  I personally evaluated the patient during the encounter  pt with depressed, ?transient thought self harm. Alert, content. telepsych pending.   Psychiatrist, Dr Rob Bunting has evaluated and states pt is psychiatrically clear for discharge, recommends community mental health follow up. He states patient not suicidal, no acute psychosis. Recheck pt alert, content, normal mood/affect, no si.     Rodena Medin, PA-C 11/11/11 1020  Suzi Roots, MD 11/11/11 1350  Suzi Roots, MD 11/11/11 [phone]
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
## 2011-11-15 ENCOUNTER — Encounter (HOSPITAL_COMMUNITY): Payer: Self-pay | Admitting: *Deleted

## 2011-11-15 ENCOUNTER — Ambulatory Visit (HOSPITAL_COMMUNITY)
Admission: RE | Admit: 2011-11-15 | Discharge: 2011-11-15 | Disposition: A | Discharge status: Home / Self Care | Payer: Medicare Other | Attending: Psychiatry | Admitting: Psychiatry

## 2011-11-15 DIAGNOSIS — I10 Essential (primary) hypertension: Secondary | ICD-10-CM | POA: Insufficient documentation

## 2011-11-15 DIAGNOSIS — E119 Type 2 diabetes mellitus without complications: Secondary | ICD-10-CM | POA: Insufficient documentation

## 2011-11-15 DIAGNOSIS — F1010 Alcohol abuse, uncomplicated: Secondary | ICD-10-CM | POA: Insufficient documentation

## 2011-11-15 DIAGNOSIS — F319 Bipolar disorder, unspecified: Secondary | ICD-10-CM | POA: Insufficient documentation

## 2011-11-15 NOTE — BH Assessment (Signed)
Assessment Note   Diana Watson is an 56 y.o. female. Walk in accompanied by husband requesting an assessment for CD-IOP. States she has already spoken to Charmian Muff and has an appointment to start on Monday 11/19/2011 @830 . She is here for the precert to attend. Is pleasant, cooperative, verbal and neatly attired. She is reporting an alcohol history of use since age 6. States a lot of the relatives on her Mothers side of the family including her Mother have drinking problems. She reports no ETOH for 2 weeks. Also, a history of cocaine use but denies any for 3 years. Has a diagnosis of bipolar disorder and takes multiple medications including Klonopin but denies abusing any of these medications. No prior history of inpatient stays for detox or rehab but does have a inpatient hx for psych admissions. Reports being on disability for bipolar disorder. Most recent hospitalization was 8 months ago her at [facility].Recently went to her Mothers home in [place] in July and increased her alcohol use at that time stating she was drinking" morning noon and night"  Approximately 12 glasses of liquor daily. Also reports issues with her Mother but didn't elaborate on this statement. States she really needs IOP to keep from relapsing on ETOH and cocaine. Showed her the entrance to IOP for her appt. On Monday am. Expressed her appreciation and her intent to follow thru.  Axis I: Alcohol Abuse Axis II: Deferred Axis III:  Past Medical History  Diagnosis Date  . Bipolar 1 disorder   . Herniated disc   . Diabetes mellitus   . Hypertension    Axis IV: problems related to social environment Axis V: GAF=52  Past Medical History:  Past Medical History  Diagnosis Date  . Bipolar 1 disorder   . Herniated disc   . Diabetes mellitus   . Hypertension     Past Surgical History  Procedure Date  . Right oophorectomy     Also removed a tumor at that time.  . Dilation and curettage of uterus     Family  History:  Family History  Problem Relation Age of Onset  . Diabetes type II Mother   . Hypertension Mother   . Diabetes type II Father   . Hypertension Father     Social History:  reports that she has never smoked. She has never used smokeless tobacco. She reports that she drinks alcohol. She reports that she does not use illicit drugs.  Additional Social History:  Alcohol / Drug Use Pain Medications: not abusing Prescriptions: not abusing Over the Counter: not abusing History of alcohol / drug use?: Yes Substance #1 Name of Substance 1: alcohol 1 - Age of First Use: 14 1 - Amount (size/oz): 6-12 glasses liquor a day 1 - Frequency: daily 1 - Duration: on an off for 40 years 1 - Last Use / Amount: 2 weeks ago  CIWA:   COWS:    Allergies:  Allergies  Allergen Reactions  . Prednisone Anxiety    Home Medications:  (Not in a hospital admission)  OB/GYN Status:  No LMP recorded. Patient is postmenopausal.  General Assessment Data Location of Assessment: [facility] Assessment Services Living Arrangements: Spouse/significant other Can pt return to current living arrangement?: Yes Admission Status: Other (Comment) (CD IOP) Is patient capable of signing voluntary admission?: Yes Transfer from: Home Referral Source: Self/Family/Friend  Education Status Is patient currently in school?: No  Risk to self Suicidal Ideation: No Suicidal Intent: No Is patient at risk for  suicide?: No Suicidal Plan?: No Access to Means: No What has been your use of drugs/alcohol within the last 12 months?: ETOH and cocaine Previous Attempts/Gestures: No Other Self Harm Risks: none Triggers for Past Attempts: None known Intentional Self Injurious Behavior: None Family Suicide History: Unknown Recent stressful life event(s): Conflict (Comment) (visited Mom in July 2013 stressful and she increase ETOH use) Persecutory voices/beliefs?: No Depression: Yes Depression Symptoms:  Tearfulness;Fatigue Substance abuse history and/or treatment for substance abuse?: Yes Suicide prevention information given to non-admitted patients: Not applicable  Risk to Others Homicidal Ideation: No Thoughts of Harm to Others: No Current Homicidal Intent: No Current Homicidal Plan: No Access to Homicidal Means: No History of harm to others?: No Assessment of Violence: None Noted Violent Behavior Description: none present Does patient have access to weapons?: No Criminal Charges Pending?: No Does patient have a court date: No  Psychosis Hallucinations: None noted Delusions: None noted  Mental Status Report Appear/Hygiene:  (unremarkable) Eye Contact: Good Motor Activity: Freedom of movement;Unremarkable Speech: Logical/coherent Level of Consciousness: Alert;Crying Mood: Sad;Helpless Affect: Sad Anxiety Level: Minimal Thought Processes: Coherent;Relevant Judgement: Unimpaired Orientation: Person;Place;Time;Situation Obsessive Compulsive Thoughts/Behaviors: None  Cognitive Functioning Concentration: Decreased Memory: Recent Intact;Remote Intact IQ: Average Insight: Fair Impulse Control: Fair Appetite: Good Sleep: No Change Vegetative Symptoms: None  ADLScreening [facility] Assessment Services) Patient's cognitive ability adequate to safely complete daily activities?: Yes Patient able to express need for assistance with ADLs?: Yes Independently performs ADLs?: Yes (appropriate for developmental age)  Abuse/Neglect [facility]) Physical Abuse: Denies Verbal Abuse: Denies Sexual Abuse: Denies  Prior Inpatient Therapy Prior Inpatient Therapy: Yes Prior Therapy Dates: 04/2011 Prior Therapy Facilty/Provider(s): [facility] Reason for Treatment: bipolar  Prior Outpatient Therapy Prior Outpatient Therapy: Yes Prior Therapy Dates: current Prior Therapy Facilty/Provider(s): [facility]- Dr. Jeri Lager Reason for Treatment: bipolar  med management  ADL  Screening (condition at time of admission) Patient's cognitive ability adequate to safely complete daily activities?: Yes Patient able to express need for assistance with ADLs?: Yes Independently performs ADLs?: Yes (appropriate for developmental age) Weakness of Legs: None Weakness of Arms/Hands: None  Home Assistive Devices/Equipment Home Assistive Devices/Equipment: None    Abuse/Neglect Assessment (Assessment to be complete while patient is alone) Physical Abuse: Denies Verbal Abuse: Denies Sexual Abuse: Denies Exploitation of patient/patient's resources: Denies Self-Neglect: Denies Values / Beliefs Cultural Requests During Hospitalization: None Spiritual Requests During Hospitalization: None   Advance Directives (For Healthcare) Advance Directive: Patient does not have advance directive Pre-existing out of facility DNR order (yellow form or pink MOST form): No Nutrition Screen Diet: Regular;Carb modified Unintentional weight loss greater than 10lbs within the last month: No Problems chewing or swallowing foods and/or liquids: No Home Tube Feeding or Total Parenteral Nutrition (TPN): No Patient appears severely malnourished: No Pregnant or Lactating: No  Additional Information 1:1 In Past 12 Months?: No CIRT Risk: No Elopement Risk: No Does patient have medical clearance?: No     Disposition:  Disposition Disposition of Patient: Referred to Patient referred to: Other (Comment) (CD IOP appt 11/19/2011 @ 830)  On Site Evaluation by:   Reviewed with Physician:     Wynona Luna 11/15/2011 10:33 AM

## 2011-11-20 ENCOUNTER — Other Ambulatory Visit (HOSPITAL_COMMUNITY): Payer: Medicare Other | Attending: Psychiatry

## 2011-11-20 ENCOUNTER — Encounter (HOSPITAL_COMMUNITY): Payer: Self-pay

## 2011-11-20 DIAGNOSIS — F3160 Bipolar disorder, current episode mixed, unspecified: Secondary | ICD-10-CM

## 2011-11-20 DIAGNOSIS — F3130 Bipolar disorder, current episode depressed, mild or moderate severity, unspecified: Secondary | ICD-10-CM | POA: Insufficient documentation

## 2011-11-20 DIAGNOSIS — E119 Type 2 diabetes mellitus without complications: Secondary | ICD-10-CM | POA: Insufficient documentation

## 2011-11-20 DIAGNOSIS — F1921 Other psychoactive substance dependence, in remission: Secondary | ICD-10-CM | POA: Insufficient documentation

## 2011-11-20 DIAGNOSIS — F603 Borderline personality disorder: Secondary | ICD-10-CM | POA: Insufficient documentation

## 2011-11-20 DIAGNOSIS — I10 Essential (primary) hypertension: Secondary | ICD-10-CM | POA: Insufficient documentation

## 2011-11-20 DIAGNOSIS — F411 Generalized anxiety disorder: Secondary | ICD-10-CM | POA: Insufficient documentation
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
## 2011-11-21 ENCOUNTER — Encounter (HOSPITAL_COMMUNITY): Payer: Medicare Other | Attending: Psychiatry

## 2011-11-21 DIAGNOSIS — I10 Essential (primary) hypertension: Secondary | ICD-10-CM | POA: Insufficient documentation

## 2011-11-21 DIAGNOSIS — F3130 Bipolar disorder, current episode depressed, mild or moderate severity, unspecified: Secondary | ICD-10-CM | POA: Insufficient documentation

## 2011-11-21 DIAGNOSIS — E119 Type 2 diabetes mellitus without complications: Secondary | ICD-10-CM | POA: Insufficient documentation

## 2011-11-21 DIAGNOSIS — F603 Borderline personality disorder: Secondary | ICD-10-CM | POA: Insufficient documentation

## 2011-11-21 DIAGNOSIS — F411 Generalized anxiety disorder: Secondary | ICD-10-CM | POA: Insufficient documentation
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
## 2011-11-21 NOTE — Progress Notes (Signed)
    Daily Group Progress Note  Program: IOP  Group Time: 9:00-10:30 am   Participation Level: Active  Behavioral Response: Appropriate  Type of Therapy:  Process Group  Summary of Progress: Patient reports feeling depressed today and shared her fears associated with the possibility of her husband of fifteen years leaving her due to be tired of dealing with her explosive mood episodes associated with her Bipolar diagnosis. Patient shared how her husband can recognize her mood shifts before she can and knows when she needs to go into the hospital. She states her goal is to become more aware of this shift herself and have tools to better manage her moods.      Group Time: 10:30 am - 12:00 pm   Participation Level:  Active  Behavioral Response: Appropriate  Type of Therapy: Psycho-education Group  Summary of Progress: Patient learned about stress and it's impacts on the body and on mental health and practiced the technique of Progressive Muscle Relaxation to help reduce stress and create a sense of calm.    Maxcine Ham, MSW, LCSW

## 2011-11-21 NOTE — Progress Notes (Signed)
    Daily Group Progress Note  Program: IOP  Group Time: 9:00-10:30 am   Participation Level: Active  Behavioral Response: Appropriate  Type of Therapy:  Process Group  Summary of Progress: Today was patients first day in the group. She required some direction to learn the norms of the group and to follow the structure, but was easily redirected. She appeared attentive and engaged.      Group Time: 10:30 am - 12:00 pm   Participation Level:  Active  Behavioral Response: Appropriate  Type of Therapy: Psycho-education Group  Summary of Progress: Patient learned the importance of having a daily schedule of "must do" items to ensure daily wellness and manage stress and mental health symptoms. Patient began creating their list.   Maxcine Ham, MSW, LCSW

## 2011-11-22 ENCOUNTER — Encounter (HOSPITAL_COMMUNITY): Payer: Medicare Other

## 2011-11-22 NOTE — Progress Notes (Signed)
    Daily Group Progress Note  Program: IOP  Group Time: 9:00-10:30 am   Participation Level: Minimal  Behavioral Response: Passive-Aggressive, Resistant, Disruptive and Attention-Seeking  Type of Therapy:  Process Group  Summary of Progress: Patient was the first to share today and expressed having high depression associated with binge eating last night and having a high desire to drink alcohol to "numb" herself from feeling. Right in the middle of her sharing she abruptly stood up and walked out of the room stating she needed to be alone. Member stayed out of the room for thirty minutes and then returned to grab her purse and appeared to be leaving the group for the day. Writer asked patient to check in with the case manager before leaving. This event caused great disturbance in the millieu as members were concerned about patient and patient was refusing to share.   During break Clinical research associate met with the treatment team and discussed concerns about patient returning to the group. Patient appears to lack distress tolerance skills to manage emotions and be able to share painful feelings within in the group setting. Patient caused a great distraction among other members who were concerned about patient. Patient also has a strong desire to drink alcohol and it was determined this level of care is not appropriate for patient at this time. Writer contacted patient by phone and discussed this and referred her back to her primary therapy and psychiatrist and referred [facility] to [facility] for daily support groups and to AA for help with addiction.      Group Time: 10:30 am - 12:00 pm   Participation Level:  None  Behavioral Response: None  Type of Therapy: Grief and Loss  Summary of Progress: Patient left with her husband and was not present for this group.    Maxcine Ham, MSW, LCSW

## 2011-11-23 ENCOUNTER — Encounter (HOSPITAL_COMMUNITY): Payer: Medicare Other

## 2011-11-23 ENCOUNTER — Telehealth (HOSPITAL_COMMUNITY): Payer: Self-pay | Admitting: Psychiatry

## 2011-11-26 ENCOUNTER — Encounter (HOSPITAL_COMMUNITY): Payer: Medicare Other

## 2011-11-26 NOTE — Progress Notes (Signed)
Patient ID: Diana Watson, female   DOB: 30-Jul-1955, 56 y.o.   MRN: [id number] D:  This is a 52 married african [place] female, who was referred per CD-IOP, treatment for depressive and anxiety symptoms with SI  (no plan).  Apparently, pt was inappropriate in the groups (cc: group note from 11-22-11).  Group leader called patient to discuss her behavior.  A:  Patient will be discharged through the mail.  Writer encouraged pt to return to [facility] or [facility] due to her binge drinking.  Informed patient about the [facility] also.  Pt to return back to her current therapist and psychiatrist.  R:  Pt receptive.

## 2011-11-26 NOTE — Progress Notes (Signed)
[facility] Intensive Outpatient Program Discharge Summary  Jorgina Binning [id number]                                                Discharge Note  Patient:  Diana Watson is an 56 y.o., female DOB:  [birth year]  Date of Admission:  11-20-11  Date of Discharge  11-22-11  Reason for Admission: Depression and anxiety  Hospital Course: 56 year-old female with a history of polysubstance dependence was referred from the chemical dependency IOP 4 Hz depression. Patient was on multiple meds which were continued. In group she became upset stating that she wanted to drink and walked out of the group. Subsequently he returned and still wanted to get drunk to relieve her pain regarding her conflict with her mother. At this time patient was deemed inappropriate for this group as her primary issues were substance abuse and patient was actively craving alcohol. It was decided to discharge her and refer her to her primary providers Dr. Jeri Lager for medications and Lupita Raider for therapy. He was also decided to refer her to the [facility] for CD treatment At the time of discharge patient was not suicidal or homicidal and had no hallucinations or delusions.  Lab Results: No results found for this or any previous visit (from the past 48 hour(s)).  Current outpatient prescriptions:acarbose (PRECOSE) 25 MG tablet, Take 25 mg by mouth 3 (three) times daily with meals., Disp: , Rfl: ;  chlorproMAZINE (THORAZINE) 25 MG tablet, Take 1 tablet (25 mg total) by mouth 4 (four) times daily. For anxiety, Disp: 120 tablet, Rfl: 0;  chlorproMAZINE (THORAZINE) 25 MG tablet, Take 50 mg by mouth 2 (two) times daily as needed., Disp: , Rfl:  cholecalciferol (VITAMIN D) 1000 UNITS tablet, Take 2,000 Units by mouth daily. For Vitamin D replacement and mood control, Disp: , Rfl: ;  clonazePAM (KLONOPIN) 1 MG tablet, Take 1 mg by mouth 3 (three) times  daily., Disp: , Rfl: ;  escitalopram (LEXAPRO) 10 MG tablet, Take 10 mg by mouth daily. For depression, Disp: , Rfl:  glipiZIDE (GLUCOTROL) 5 MG tablet, Take 10 mg by mouth 2 (two) times daily before a meal. For control of blood sugar, Disp: , Rfl: ;  hydrOXYzine (VISTARIL) 25 MG capsule, Take 25 mg by mouth at bedtime. For insomnia, Disp: , Rfl: ;  lamoTRIgine (LAMICTAL) 200 MG tablet, Take 200 mg by mouth daily. For mood control, Disp: , Rfl:  lisinopril (PRINIVIL,ZESTRIL) 40 MG tablet, Take 40 mg by mouth daily. For control of high blood pressure, Disp: , Rfl: ;  Multiple Vitamin (MULTIVITAMIN WITH MINERALS) TABS, Take 1 tablet by mouth daily., Disp: , Rfl: ;  naproxen (NAPROSYN) 500 MG tablet, Take 500 mg by mouth 3 (three) times daily with meals., Disp: , Rfl:  omega-3 acid ethyl esters (LOVAZA) 1 G capsule, Take 2 g by mouth 2 (two) times daily. For brain health and mood control, Disp: , Rfl: ;  pioglitazone (ACTOS) 15 MG tablet, Take 15 mg by mouth daily., Disp: , Rfl: ;  ziprasidone (GEODON) 80 MG capsule, Take 1 capsule (80 mg total) by mouth at bedtime. For mood control, Disp: 30 capsule, Rfl: 0;  ziprasidone (GEODON) 80 MG capsule, Take 80 mg by mouth at bedtime., Disp: , Rfl:   Axis Diagnosis:   Axis I: Bipolar, Depressed,  Substance Abuse and Substance Induced Mood Disorder Axis II: Cluster B Traits Axis III:  Past Medical History  Diagnosis Date  . Bipolar 1 disorder   . Herniated disc   . Diabetes mellitus   . Hypertension   . Depression   . Personality disorder    Axis IV: economic problems, other psychosocial or environmental problems, problems related to social environment and problems with primary support group Axis V: 61-70 mild symptoms   Level of Care:  OP  Discharge destination:  Home  Is patient on multiple antipsychotic therapies at discharge:  Yes,   Do you recommend tapering to monotherapy for antipsychotics?  No    Has Patient had three or more failed trials of  antipsychotic monotherapy by history:  No  Patient phone:  [phone] (home)  Patient address:   [address] [place] [address],   Follow-up recommendations:  Activity:  As tolerated Diet:  Regular Other:  Followup with Dr. Jeri Lager for medications and Lupita Raider for therapy  Comments:  Patient has been referred to the [facility] and her for CD treatment  The patient received suicide prevention pamphlet:  Yes   Margit Banda 11/26/2011, 2:21 PM  [facility] 11/26/2011

## 2011-11-27 ENCOUNTER — Encounter (HOSPITAL_COMMUNITY): Payer: Medicare Other

## 2011-11-28 ENCOUNTER — Encounter (HOSPITAL_COMMUNITY): Payer: Medicare Other

## 2011-11-29 ENCOUNTER — Encounter (HOSPITAL_COMMUNITY): Payer: Medicare Other

## 2011-11-30 ENCOUNTER — Encounter (HOSPITAL_COMMUNITY): Payer: Medicare Other

## 2011-12-04 ENCOUNTER — Encounter (HOSPITAL_COMMUNITY): Payer: Medicare Other

## 2011-12-05 ENCOUNTER — Encounter (HOSPITAL_COMMUNITY): Payer: Medicare Other

## 2011-12-06 ENCOUNTER — Encounter (HOSPITAL_COMMUNITY): Payer: Medicare Other

## 2011-12-07 ENCOUNTER — Encounter (HOSPITAL_COMMUNITY): Payer: Medicare Other

## 2011-12-10 ENCOUNTER — Encounter (HOSPITAL_COMMUNITY): Payer: Medicare Other

## 2011-12-11 ENCOUNTER — Encounter (HOSPITAL_COMMUNITY): Payer: Medicare Other

## 2011-12-13 NOTE — Telephone Encounter (Signed)
No contact with this client, accidentally opened

## 2012-01-03 ENCOUNTER — Emergency Department (HOSPITAL_COMMUNITY)
Admission: EM | Admit: 2012-01-03 | Discharge: 2012-01-04 | Disposition: A | Discharge status: Home / Self Care | Payer: Medicare Other | Attending: Emergency Medicine | Admitting: Emergency Medicine

## 2012-01-03 ENCOUNTER — Encounter (HOSPITAL_COMMUNITY): Payer: Self-pay

## 2012-01-03 DIAGNOSIS — S90129A Contusion of unspecified lesser toe(s) without damage to nail, initial encounter: Secondary | ICD-10-CM | POA: Insufficient documentation

## 2012-01-03 DIAGNOSIS — F319 Bipolar disorder, unspecified: Secondary | ICD-10-CM | POA: Insufficient documentation

## 2012-01-03 DIAGNOSIS — S90229A Contusion of unspecified lesser toe(s) with damage to nail, initial encounter: Secondary | ICD-10-CM

## 2012-01-03 DIAGNOSIS — I10 Essential (primary) hypertension: Secondary | ICD-10-CM | POA: Insufficient documentation

## 2012-01-03 DIAGNOSIS — F609 Personality disorder, unspecified: Secondary | ICD-10-CM | POA: Insufficient documentation

## 2012-01-03 DIAGNOSIS — E119 Type 2 diabetes mellitus without complications: Secondary | ICD-10-CM | POA: Insufficient documentation

## 2012-01-03 DIAGNOSIS — Z888 Allergy status to other drugs, medicaments and biological substances status: Secondary | ICD-10-CM | POA: Insufficient documentation

## 2012-01-03 DIAGNOSIS — IMO0002 Reserved for concepts with insufficient information to code with codable children: Secondary | ICD-10-CM

## 2012-01-03 NOTE — ED Notes (Signed)
Pulses marked bilateral on foot.  Equal and palpable.

## 2012-01-03 NOTE — ED Notes (Signed)
Pt is a diabetic.  States that her feet have felt differently lately with pain, blisters, and discoloration.  Was seen 9/24 at MD office for foot blisters.  Toenails are discolored.  CBG controlled at home.  Pt Type II diabetic.

## 2012-01-04 LAB — GLUCOSE, CAPILLARY
Glucose-Capillary: 140 mg/dL — ABNORMAL HIGH (ref 70–99)
Glucose-Capillary: 41 mg/dL — CL (ref 70–99)

## 2012-01-04 MED ORDER — SULFAMETHOXAZOLE-TMP DS 800-160 MG PO TABS
1.0000 | ORAL_TABLET | Freq: Once | ORAL | Status: AC
  Administered 2012-01-04: 1 via ORAL
  Filled 2012-01-04: qty 1

## 2012-01-04 MED ORDER — SULFAMETHOXAZOLE-TRIMETHOPRIM 800-160 MG PO TABS: 1.0000 | ORAL_TABLET | Freq: Two times a day (BID) | ORAL | Status: DC

## 2012-01-04 MED ORDER — TRAMADOL-ACETAMINOPHEN 37.5-325 MG PO TABS: ORAL_TABLET | ORAL | Status: DC

## 2012-01-04 NOTE — ED Notes (Signed)
Patient refused to review discharge instructions. Said pharmacist will review her Rx with her when they are filled. Patient yelling at staff, she is dissatisfied with length of time to receive treatment and discharge. Patient again states "I am bipolar, and manic depressive. If I don't take my medicines this is what happens. Next thing you know you'll be trying to put me in the psych unit." Patient signed discharge e-doc and left.

## 2012-01-04 NOTE — ED Provider Notes (Cosign Needed)
History     CSN: [id number]  Arrival date & time 01/03/12  2220   First MD Initiated Contact with Patient 01/04/12 0057      Chief Complaint  Patient presents with  . Foot Pain  . Blister    (Consider location/radiation/quality/duration/timing/severity/associated sxs/prior treatment) HPI  Patient reports she got some new diabetic shoes about a month ago however she did not start wearing them until the past 2 weeks. She reports about 8 days ago she started getting blister on her left foot and having some discoloration of her toenails. She denies any fever. She states she had a blister on her left foot but her family doctor had lanced recently. She denies any numbness or tingling in her extremities and denies having diabetic neuropathy.  PCP Dr.Charity Isabella Stalling  Past Medical History  Diagnosis Date  . Bipolar 1 disorder   . Herniated disc   . Diabetes mellitus   . Hypertension   . Depression   . Personality disorder     Past Surgical History  Procedure Date  . Right oophorectomy     Also removed a tumor at that time.  . Dilation and curettage of uterus     Family History  Problem Relation Age of Onset  . Diabetes type II Mother   . Hypertension Mother   . OCD Mother   . Bipolar disorder Mother   . Diabetes type II Father   . Hypertension Father   . Bipolar disorder Cousin      History   Social History  . Marital Status: Married    Spouse Name: N/A    Number of Children: N/A  . Years of Education: N/A   Occupational History  . Not on file.   Social History Main Topics  . Smoking status: Never Smoker   . Smokeless tobacco: Never Used  . Alcohol Use: 0.0 oz/week    6-12 drink(s) per week     Rarely   . Drug Use: No  . Sexually Active: Yes    Birth Control/ Protection: None   Other Topics Concern   Social History Narrative     OB History    Grav Para Term Preterm Abortions TAB SAB Ect Mult Living                  Review of Systems  All other  systems reviewed and are negative.    Allergies  Prednisone and Lactose intolerance (gi)  Home Medications   Current Outpatient Rx  Name Route Sig Dispense Refill  . ACARBOSE 25 MG PO TABS Oral Take 25 mg by mouth 3 (three) times daily with meals.    . ARIPIPRAZOLE 10 MG PO TABS Oral Take 10 mg by mouth daily.    . CHLORPROMAZINE HCL 25 MG PO TABS Oral Take 25 mg by mouth 2 (two) times daily.    Marland Kitchen CLONAZEPAM 1 MG PO TABS Oral Take 1 mg by mouth 3 (three) times daily.    Marland Kitchen ESCITALOPRAM OXALATE 10 MG PO TABS Oral Take 10 mg by mouth daily. For depression    . GLIPIZIDE 10 MG PO TABS Oral Take 10 mg by mouth 2 (two) times daily before a meal.    . HYDROXYZINE PAMOATE PO Oral Take 50 mg by mouth at bedtime.    Marland Kitchen LAMOTRIGINE 200 MG PO TABS Oral Take 200 mg by mouth daily. For mood control    . LISINOPRIL 40 MG PO TABS Oral Take 40 mg by mouth daily.  For control of high blood pressure    . ADULT MULTIVITAMIN W/MINERALS CH Oral Take 1 tablet by mouth daily.    Marland Kitchen NAPROXEN 500 MG PO TABS Oral Take 500 mg by mouth 3 (three) times daily with meals.    . OMEGA-3-ACID ETHYL ESTERS 1 G PO CAPS Oral Take 2 g by mouth 2 (two) times daily. For brain health and mood control    . FISH OIL 1000 MG PO CAPS Oral Take by mouth.    Marland Kitchen PIOGLITAZONE HCL 15 MG PO TABS Oral Take 15 mg by mouth daily.    Marland Kitchen ZIPRASIDONE HCL 80 MG PO CAPS Oral Take 80 mg by mouth at bedtime.    Marland Kitchen VITAMIN D 1000 UNITS PO TABS Oral Take 2,000 Units by mouth daily. For Vitamin D replacement and mood control      BP 125/70  Pulse 75  Temp 98.2 F (36.8 C) (Oral)  Resp 20  SpO2 100%  Vital signs normal    Physical Exam  Nursing note and vitals reviewed. Constitutional: She is oriented to person, place, and time. She appears well-developed and well-nourished.  Non-toxic appearance. She does not appear ill. No distress.  HENT:  Head: Normocephalic and atraumatic.  Right Ear: External ear normal.  Left Ear: External ear normal.    Nose: Nose normal. No mucosal edema or rhinorrhea.  Mouth/Throat: Mucous membranes are normal. No dental abscesses or uvula swelling.  Eyes: Conjunctivae normal and EOM are normal. Pupils are equal, round, and reactive to light.  Neck: Normal range of motion and full passive range of motion without pain. Neck supple.  Cardiovascular: Regular rhythm.   No murmur heard. Pulmonary/Chest: Effort normal. No respiratory distress. She has no rhonchi. She exhibits no crepitus.  Abdominal: Soft. Normal appearance and bowel sounds are normal.  Musculoskeletal: Normal range of motion. She exhibits no tenderness.       Moves all extremities well.   Patient noted to have a clear fluid-filled blister over the left great toe MTP joint that is approximately the size of a nickel with some peeling of the skin about double that size where she states she had a blister before.  Patient is noted to have discoloration of the toenails of her right great, second and third toes and there is bruising and swelling around the cuticle of the great toenail with laxity of the toenail.  Her second toe toenail is also darkened in appearance.  Neurological: She is alert and oriented to person, place, and time. She has normal strength. No cranial nerve deficit.  Skin: Skin is warm, dry and intact. No rash noted. No erythema. No pallor.  Psychiatric: She has a normal mood and affect. Her speech is normal and behavior is normal. Her mood appears not anxious.    ED Course  Procedures (including critical care time)  Medications  sulfamethoxazole-trimethoprim (BACTRIM DS) 800-160 MG per tablet 1 tablet (1 tablet Oral Given 01/04/12 0225)     Patient states she has a specific podiatrist in [facility] but she wants to be referred to. She is advised to have her PCP due to a referral since I am unfamiliar with that physician. She also was advised the blister on her left foot should not be lanced because now it has clear fluid in  it and it is not an abscess.  Results for orders placed during the hospital encounter of 01/03/12  GLUCOSE, CAPILLARY      Component Value Range   Glucose-Capillary 41 (*)  70 - 99 mg/dL  GLUCOSE, CAPILLARY      Component Value Range   Glucose-Capillary 140 (*) 70 - 99 mg/dL      1. Blister of left foot or toe   2. Toenail bruise    New Prescriptions   SULFAMETHOXAZOLE-TRIMETHOPRIM (SEPTRA DS) 800-160 MG PER TABLET    Take 1 tablet by mouth every 12 (twelve) hours.   TRAMADOL-ACETAMINOPHEN (ULTRACET) 37.5-325 MG PER TABLET    Take 1 or 2 po Q 6hrs for pain    Plan discharge  Devoria Albe, MD, FACEP    MDM          Ward Givens, MD 01/04/12 0230

## 2012-01-04 NOTE — ED Notes (Signed)
Patient CBG rechecked 140 mcg/dL. Patient appears anxious. States it is "I am bipolar and it is hours past my bedtime and I haven't had my night time meds".

## 2012-01-04 NOTE — ED Notes (Signed)
MD at bedside. 

## 2012-01-04 NOTE — ED Notes (Signed)
Patient states HgBA1C on 12/04/11, was 6.9, in June 2013 HgBA1C was 12.4

## 2012-01-04 NOTE — ED Notes (Signed)
Patient blood sugar reading 49mcg/dL. Patient states she has not had dinner. Given apple juice, graham crackers, and cheese.

## 2012-01-04 NOTE — ED Notes (Signed)
Physician notified of patient anxiety and request for home meds. Physician reports she will be at bedside in approx 10 minutes. Patient updated and requested to NOT take home HS meds.

## 2012-01-06 ENCOUNTER — Encounter (HOSPITAL_COMMUNITY): Payer: Self-pay | Admitting: Emergency Medicine

## 2012-01-06 ENCOUNTER — Emergency Department (HOSPITAL_COMMUNITY)
Admission: EM | Admit: 2012-01-06 | Discharge: 2012-01-06 | Disposition: A | Discharge status: Home / Self Care | Payer: Medicare Other | Attending: Emergency Medicine | Admitting: Emergency Medicine

## 2012-01-06 DIAGNOSIS — S61419A Laceration without foreign body of unspecified hand, initial encounter: Secondary | ICD-10-CM

## 2012-01-06 DIAGNOSIS — F319 Bipolar disorder, unspecified: Secondary | ICD-10-CM | POA: Insufficient documentation

## 2012-01-06 DIAGNOSIS — I10 Essential (primary) hypertension: Secondary | ICD-10-CM | POA: Insufficient documentation

## 2012-01-06 DIAGNOSIS — E119 Type 2 diabetes mellitus without complications: Secondary | ICD-10-CM | POA: Insufficient documentation

## 2012-01-06 DIAGNOSIS — W268XXA Contact with other sharp object(s), not elsewhere classified, initial encounter: Secondary | ICD-10-CM | POA: Insufficient documentation

## 2012-01-06 DIAGNOSIS — Z23 Encounter for immunization: Secondary | ICD-10-CM | POA: Insufficient documentation

## 2012-01-06 DIAGNOSIS — S61409A Unspecified open wound of unspecified hand, initial encounter: Secondary | ICD-10-CM | POA: Insufficient documentation

## 2012-01-06 MED ORDER — TETANUS-DIPHTH-ACELL PERTUSSIS 5-2.5-18.5 LF-MCG/0.5 IM SUSP
0.5000 mL | Freq: Once | INTRAMUSCULAR | Status: AC
  Administered 2012-01-06: 0.5 mL via INTRAMUSCULAR
  Filled 2012-01-06: qty 0.5

## 2012-01-06 NOTE — ED Notes (Signed)
Laceration to right hand between thumb and index finger on lid of can last night when making dinner. approx 1". No bleeding at present-- clean, no redness.

## 2012-01-06 NOTE — ED Provider Notes (Signed)
History     CSN: [id number]  Arrival date & time 01/06/12  0717   First MD Initiated Contact with Patient 01/06/12 [phone]      Chief Complaint  Patient presents with  . laceration to right hand     (Consider location/radiation/quality/duration/timing/severity/associated sxs/prior treatment) HPI Comments: Diana Watson is a 56 y.o. Female who presents with complaint of laceration to the right hand. Pt states she was opening a can of beets, states the lid of the can cut her on right hand between her thumb and 2nd finger. Pt states bleeding controlled. Normal rom of all fingers, normal sensation of hand and fingers. Pt states needs tetanus updated. Denies any other injuries.    Past Medical History  Diagnosis Date  . Bipolar 1 disorder   . Herniated disc   . Diabetes mellitus   . Hypertension   . Depression   . Personality disorder     Past Surgical History  Procedure Date  . Right oophorectomy     Also removed a tumor at that time.  . Dilation and curettage of uterus     Family History  Problem Relation Age of Onset  . Diabetes type II Mother   . Hypertension Mother   . OCD Mother   . Bipolar disorder Mother   . Diabetes type II Father   . Hypertension Father   . Bipolar disorder Cousin     History  Substance Use Topics  . Smoking status: Never Smoker   . Smokeless tobacco: Never Used  . Alcohol Use: 0.0 oz/week    6-12 drink(s) per week     Rarely     OB History    Grav Para Term Preterm Abortions TAB SAB Ect Mult Living                  Review of Systems  Constitutional: Negative for fever and chills.  Respiratory: Negative.   Cardiovascular: Negative.   Skin: Positive for wound.  Neurological: Negative for weakness and numbness.  Hematological: Does not bruise/bleed easily.    Allergies  Prednisone and Lactose intolerance (gi)  Home Medications   Current Outpatient Rx  Name Route Sig Dispense Refill  . ACARBOSE 25 MG PO TABS Oral Take  25 mg by mouth 3 (three) times daily with meals.    . CHLORPROMAZINE HCL 25 MG PO TABS Oral Take 25-50 mg by mouth at bedtime.     Marland Kitchen CLONAZEPAM 1 MG PO TABS Oral Take 1 mg by mouth 3 (three) times daily.    Marland Kitchen ESCITALOPRAM OXALATE 10 MG PO TABS Oral Take 10 mg by mouth every morning. For depression    . GLIPIZIDE 10 MG PO TABS Oral Take 10 mg by mouth 2 (two) times daily before a meal.    . HYDROXYZINE PAMOATE 50 MG PO CAPS Oral Take 50 mg by mouth at bedtime.    Marland Kitchen LAMOTRIGINE 200 MG PO TABS Oral Take 200 mg by mouth every morning. For mood control    . LISINOPRIL 40 MG PO TABS Oral Take 40 mg by mouth every morning. For control of high blood pressure    . ADULT MULTIVITAMIN W/MINERALS CH Oral Take 1 tablet by mouth every morning.     Marland Kitchen NAPROXEN 500 MG PO TABS Oral Take 500 mg by mouth 3 (three) times daily as needed. For pain    . FISH OIL 1000 MG PO CAPS Oral Take 1 capsule by mouth every evening.     Marland Kitchen  PIOGLITAZONE HCL 15 MG PO TABS Oral Take 15 mg by mouth every morning.     . SULFAMETHOXAZOLE-TRIMETHOPRIM 800-160 MG PO TABS Oral Take 1 tablet by mouth every 12 (twelve) hours. 20 tablet 0  . TRAMADOL-ACETAMINOPHEN 37.5-325 MG PO TABS Oral Take 1-2 tablets by mouth every 6 (six) hours as needed. Take 1 or 2 po Q 6hrs for pain    . ZIPRASIDONE HCL 80 MG PO CAPS Oral Take 80 mg by mouth at bedtime.      BP 125/74  Pulse 81  Temp 97.7 F (36.5 C) (Oral)  Resp 18  SpO2 100%  Physical Exam  Nursing note and vitals reviewed. Constitutional: She is oriented to person, place, and time. She appears well-developed and well-nourished. No distress.  HENT:  Head: Normocephalic.  Eyes: Conjunctivae normal are normal.  Neck: Neck supple.  Cardiovascular: Normal rate, regular rhythm and normal heart sounds.   Pulmonary/Chest: Effort normal and breath sounds normal. No respiratory distress. She has no wheezes. She has no rales.  Musculoskeletal:       Full rom of all fingers. 5/5 strength against  resistance of right thumb, and 2-5th fingers. Good sensation and cap refill of all fingers.   Neurological: She is alert and oriented to person, place, and time.  Skin: Skin is warm and dry.       3cm laceration to the palmar surface of right hand in between thumb and index finger. Hemostatic. Does not appear to involve tendon or muscles.     ED Course  Procedures (including critical care time)  Hand laceration, does not appear to be involving any deep structures. Good strength and rom in all directions of all fingers. Normal <2sec cap refill of all fingers. Good sensation of distal fingers on right hand.  LACERATION REPAIR Performed by: Lottie Mussel Authorized by: Jaynie Crumble A Consent: Verbal consent obtained. Risks and benefits: risks, benefits and alternatives were discussed Consent given by: patient Patient identity confirmed: provided demographic data Prepped and Draped in normal sterile fashion Wound explored  Laceration Location: right hand  Laceration Length: 3cm  No Foreign Bodies seen or palpated  Anesthesia: local infiltration  Local anesthetic: lidocaine 2% w/o epinephrine  Anesthetic total: 3 ml  Irrigation method: syringe Amount of cleaning: standard  Skin closure: prolene 5.0  Number of sutures: 4  Technique: simple interrupted  Patient tolerance: Patient tolerated the procedure well with no immediate complications.  8:34 AM laceration repaired with stitches. Bacitracin, sterile dressing applied. Tetanus updated. D/c home with follow up as needed.    1. Hand laceration       MDM          Lottie Mussel, PA 01/06/12 1601

## 2012-01-06 NOTE — ED Notes (Signed)
Pa at bedside with suture cart

## 2012-01-06 NOTE — ED Notes (Signed)
Pt escorted to d/c window. Verbalized understanding d/c instructions.in no acute distress

## 2012-01-06 NOTE — ED Notes (Signed)
Pt alert and oriented x4. Respirations even and unlabored, bilateral symmetrical rise and fall of chest. Skin warm and dry. In no acute distress. Denies needs.   

## 2012-01-09 NOTE — ED Provider Notes (Signed)
Medical screening examination/treatment/procedure(s) were performed by non-physician practitioner and as supervising physician I was immediately available for consultation/collaboration.  Flint Melter, MD 01/09/12 [phone]

## 2012-06-09 DIAGNOSIS — K760 Fatty (change of) liver, not elsewhere classified: Secondary | ICD-10-CM | POA: Insufficient documentation

## 2012-10-28 ENCOUNTER — Ambulatory Visit: Payer: Self-pay | Admitting: Internal Medicine

## 2012-11-11 ENCOUNTER — Ambulatory Visit: Payer: Self-pay | Admitting: Internal Medicine

## 2013-03-20 ENCOUNTER — Encounter (INDEPENDENT_AMBULATORY_CARE_PROVIDER_SITE_OTHER): Payer: Self-pay | Admitting: Ophthalmology

## 2013-06-30 ENCOUNTER — Other Ambulatory Visit: Payer: Self-pay

## 2013-06-30 DIAGNOSIS — Z1231 Encounter for screening mammogram for malignant neoplasm of breast: Secondary | ICD-10-CM

## 2013-07-16 ENCOUNTER — Ambulatory Visit
Admission: RE | Admit: 2013-07-16 | Discharge: 2013-07-16 | Disposition: A | Discharge status: Home / Self Care | Payer: Commercial Managed Care - HMO | Source: Ambulatory Visit

## 2013-07-16 DIAGNOSIS — Z1231 Encounter for screening mammogram for malignant neoplasm of breast: Secondary | ICD-10-CM

## 2013-12-31 LAB — HM PAP SMEAR: HM Pap smear: NORMAL

## 2014-06-09 ENCOUNTER — Ambulatory Visit (HOSPITAL_BASED_OUTPATIENT_CLINIC_OR_DEPARTMENT_OTHER): Payer: Medicare HMO | Attending: Internal Medicine | Admitting: Radiology

## 2014-06-09 VITALS — Ht 65.0 in | Wt 160.0 lb

## 2014-06-09 DIAGNOSIS — R0683 Snoring: Secondary | ICD-10-CM

## 2014-06-12 DIAGNOSIS — R0683 Snoring: Secondary | ICD-10-CM | POA: Diagnosis not present

## 2014-06-12 NOTE — Sleep Study (Signed)
NAME: Diana Watson DATE OF BIRTH:  [birth year] MEDICAL RECORD NUMBER [id number]  LOCATION: [facility]  PHYSICIAN: Candice Lunney D  DATE OF STUDY: 06/09/2014  SLEEP STUDY TYPE: Nocturnal Polysomnogram               REFERRING PHYSICIAN: Rogers Blocker, MD  INDICATION FOR STUDY: Hypersomnia with sleep apnea  EPWORTH SLEEPINESS SCORE:   0/24 HEIGHT: _0  (165.1 cm)  WEIGHT: 160 lb (72.576 kg)    Body mass index is 26.63 kg/(m^2).  NECK SIZE: 13 in.  MEDICATIONS: Charted for review  SLEEP ARCHITECTURE: Total sleep time 313 minutes with sleep efficiency 81.9%. Stage I was 1.8%, stage II 86.7%, stage III absent, REM 11.5% of total sleep time. Sleep latency 33 minutes, REM latency 132.5 minutes, awake after sleep onset 35.5 minutes, arousal index 2.7, bedtime medication: Include iliprasidone, buspirone, pravastatin, acarbose, melatonin  RESPIRATORY DATA: Apnea hypopnea index (AHI) 0.0 per hour. No respiratory events met scoring criteria  OXYGEN DATA: Mild to moderate snoring with oxygen desaturation to a nadir of 93% and mean saturation 97.6% on room air  CARDIAC DATA: Normal sinus rhythm  MOVEMENT/PARASOMNIA: No significant movement disturbance, bathroom 1  IMPRESSION/ RECOMMENDATION:   1) Normal respiratory pattern during sleep with no evidence of sleep apnea on this study night. AHI 0.0 per hour. The normal range for adults is an AHI from 0-5 events per hour. Mild to moderate snoring with oxygen desaturation to a nadir of 93% and mean saturation 97.6% on room air.   `  Deneise Lever Diplomate, American Board of Sleep Medicine  ELECTRONICALLY SIGNED ON:   06/12/2014, 10:54 AM [place] PH: [phone]   FX: [phone] [place]

## 2014-06-14 ENCOUNTER — Other Ambulatory Visit: Payer: Self-pay

## 2014-06-14 DIAGNOSIS — Z1231 Encounter for screening mammogram for malignant neoplasm of breast: Secondary | ICD-10-CM

## 2014-07-26 ENCOUNTER — Ambulatory Visit
Admission: RE | Admit: 2014-07-26 | Discharge: 2014-07-26 | Disposition: A | Discharge status: Home / Self Care | Payer: Commercial Managed Care - HMO | Source: Ambulatory Visit

## 2014-07-26 DIAGNOSIS — Z1231 Encounter for screening mammogram for malignant neoplasm of breast: Secondary | ICD-10-CM

## 2015-06-08 ENCOUNTER — Other Ambulatory Visit: Payer: Self-pay

## 2015-06-08 DIAGNOSIS — Z1231 Encounter for screening mammogram for malignant neoplasm of breast: Secondary | ICD-10-CM

## 2015-06-14 LAB — HM DIABETES EYE EXAM

## 2015-07-27 ENCOUNTER — Ambulatory Visit
Admission: RE | Admit: 2015-07-27 | Discharge: 2015-07-27 | Disposition: A | Discharge status: Home / Self Care | Payer: Commercial Managed Care - HMO | Source: Ambulatory Visit

## 2015-07-27 DIAGNOSIS — Z1231 Encounter for screening mammogram for malignant neoplasm of breast: Secondary | ICD-10-CM

## 2015-07-29 DIAGNOSIS — R29898 Other symptoms and signs involving the musculoskeletal system: Secondary | ICD-10-CM | POA: Diagnosis not present

## 2015-09-12 DIAGNOSIS — F319 Bipolar disorder, unspecified: Secondary | ICD-10-CM | POA: Diagnosis not present

## 2015-09-20 ENCOUNTER — Ambulatory Visit (INDEPENDENT_AMBULATORY_CARE_PROVIDER_SITE_OTHER): Payer: Commercial Managed Care - HMO | Admitting: Internal Medicine

## 2015-09-20 ENCOUNTER — Encounter: Payer: Self-pay | Admitting: Internal Medicine

## 2015-09-20 ENCOUNTER — Other Ambulatory Visit (INDEPENDENT_AMBULATORY_CARE_PROVIDER_SITE_OTHER): Payer: Commercial Managed Care - HMO

## 2015-09-20 VITALS — BP 138/80 | HR 74 | Temp 98.5°F | Resp 16 | Ht 65.0 in | Wt 162.8 lb

## 2015-09-20 DIAGNOSIS — R413 Other amnesia: Secondary | ICD-10-CM

## 2015-09-20 DIAGNOSIS — K5900 Constipation, unspecified: Secondary | ICD-10-CM | POA: Diagnosis not present

## 2015-09-20 DIAGNOSIS — F319 Bipolar disorder, unspecified: Secondary | ICD-10-CM

## 2015-09-20 DIAGNOSIS — E1169 Type 2 diabetes mellitus with other specified complication: Secondary | ICD-10-CM | POA: Insufficient documentation

## 2015-09-20 DIAGNOSIS — E119 Type 2 diabetes mellitus without complications: Secondary | ICD-10-CM

## 2015-09-20 DIAGNOSIS — E785 Hyperlipidemia, unspecified: Secondary | ICD-10-CM

## 2015-09-20 DIAGNOSIS — I10 Essential (primary) hypertension: Secondary | ICD-10-CM

## 2015-09-20 LAB — COMPREHENSIVE METABOLIC PANEL
ALK PHOS: 56 U/L (ref 39–117)
ALT: 27 U/L (ref 0–35)
AST: 22 U/L (ref 0–37)
Albumin: 4.8 g/dL (ref 3.5–5.2)
BILIRUBIN TOTAL: 0.3 mg/dL (ref 0.2–1.2)
BUN: 16 mg/dL (ref 6–23)
CO2: 32 mEq/L (ref 19–32)
CREATININE: 1.09 mg/dL (ref 0.40–1.20)
Calcium: 10.4 mg/dL (ref 8.4–10.5)
Chloride: 97 mEq/L (ref 96–112)
GFR: 66.23 mL/min (ref >60.00)
GLUCOSE: 275 mg/dL — AB (ref 70–99)
Potassium: 4.8 mEq/L (ref 3.5–5.1)
Sodium: 136 mEq/L (ref 135–145)
TOTAL PROTEIN: 8.4 g/dL — AB (ref 6.0–8.3)

## 2015-09-20 LAB — CBC
HEMATOCRIT: 40.3 % (ref 36.0–46.0)
HEMOGLOBIN: 13.5 g/dL (ref 12.0–15.0)
MCHC: 33.6 g/dL (ref 30.0–36.0)
MCV: 92.9 fl (ref 78.0–100.0)
PLATELETS: 217 10*3/uL (ref 150.0–400.0)
RBC: 4.34 Mil/uL (ref 3.87–5.11)
RDW: 12.9 % (ref 11.5–15.5)
WBC: 5.4 10*3/uL (ref 4.0–10.5)

## 2015-09-20 LAB — VITAMIN B12: Vitamin B-12: 886 pg/mL (ref 211–911)

## 2015-09-20 LAB — T4, FREE: Free T4: 0.61 ng/dL (ref 0.60–1.60)

## 2015-09-20 LAB — TSH: TSH: 1.43 u[IU]/mL (ref 0.35–4.50)

## 2015-09-20 LAB — HEMOGLOBIN A1C: Hgb A1c MFr Bld: 6 % (ref 4.6–6.5)

## 2015-09-20 MED ORDER — SENNA-DOCUSATE SODIUM 8.6-50 MG PO TABS
2.0000 | ORAL_TABLET | Freq: Two times a day (BID) | ORAL | Status: DC
Start: 2015-09-20 — End: 2016-05-23

## 2015-09-20 MED ORDER — LACTULOSE 10 GM/15ML PO SOLN: 10.0000 g | Freq: Every day | ORAL | Status: DC | PRN

## 2015-09-20 NOTE — Assessment & Plan Note (Addendum)
Not known to be complicated. Is taking acarbose and glipizide and it is unclear how she got on this regimen. She is not on metformin. Checking CMP and HgA1c today and will likely adjust regimen. She states last HgA1c is 6.2 which is too low. She is on 20 mg glipizide BID which will be lowered if HgA1c still low. She is also taking lisinopril 60 mg daily which is higher than recommended dosing. Her acarbose is under the recommended effective dosing for her weight (100 mg TID). She did not want to change today so if CMP okay can continue. Foot exam done, getting records for eye exam (which she stated was normal and within last 1 year).

## 2015-09-20 NOTE — Patient Instructions (Signed)
We have sent in senokot-d which is a medicine we want you to try for the stools. Take 2 pills twice a day to help move the bowels better and be softer. You can stop taking the docusate or colace as this medicine is included in the senokot-d.   We are going to get you in with the GI doctor to talk about the constipation and get the colonoscopy and they will schedule you in September.   We are checking the labs today. If the HgA1c is still <6.5 we may decrease the dose of the glipizide. We will call you back with the results.   High-Fiber Diet Fiber, also called dietary fiber, is a type of carbohydrate found in fruits, vegetables, whole grains, and beans. A high-fiber diet can have many health benefits. You need to drink about 5-6 glasses of water or fluid daily to help with the fiber. Your health care provider may recommend a high-fiber diet to help:  Prevent constipation. Fiber can make your bowel movements more regular.  Lower your cholesterol.  Relieve hemorrhoids, uncomplicated diverticulosis, or irritable bowel syndrome.  Prevent overeating as part of a weight-loss plan.  Prevent heart disease, type 2 diabetes, and certain cancers. WHAT IS MY PLAN? The recommended daily intake of fiber includes:  38 grams for men under age 21.  30 grams for men over age 68.  25 grams for women under age 72.  21 grams for women over age 49. You can get the recommended daily intake of dietary fiber by eating a variety of fruits, vegetables, grains, and beans. Your health care provider may also recommend a fiber supplement if it is not possible to get enough fiber through your diet. WHAT DO I NEED TO KNOW ABOUT A HIGH-FIBER DIET?  Fiber supplements have not been widely studied for their effectiveness, so it is better to get fiber through food sources.  Always check the fiber content on thenutrition facts label of any prepackaged food. Look for foods that contain at least 5 grams of fiber per  serving.  Ask your dietitian if you have questions about specific foods that are related to your condition, especially if those foods are not listed in the following section.  Increase your daily fiber consumption gradually. Increasing your intake of dietary fiber too quickly may cause bloating, cramping, or gas.  Drink plenty of water. Water helps you to digest fiber. WHAT FOODS CAN I EAT? Grains Whole-grain breads. Multigrain cereal. Oats and oatmeal. Brown rice. Barley. Bulgur wheat. Millet. Bran muffins. Popcorn. Rye wafer crackers. Vegetables Sweet potatoes. Spinach. Kale. Artichokes. Cabbage. Broccoli. Green peas. Carrots. Squash. Fruits Berries. Pears. Apples. Oranges. Avocados. Prunes and raisins. Dried figs. Meats and Other Protein Sources Navy, kidney, pinto, and soy beans. Split peas. Lentils. Nuts and seeds. Dairy Fiber-fortified yogurt. Beverages Fiber-fortified soy milk. Fiber-fortified orange juice. Other Fiber bars. The items listed above may not be a complete list of recommended foods or beverages. Contact your dietitian for more options. WHAT FOODS ARE NOT RECOMMENDED? Grains White bread. Pasta made with refined flour. White rice. Vegetables Fried potatoes. Canned vegetables. Well-cooked vegetables.  Fruits Fruit juice. Cooked, strained fruit. Meats and Other Protein Sources Fatty cuts of meat. Fried Environmental education officer or fried fish. Dairy Milk. Yogurt. Cream cheese. Sour cream. Beverages Soft drinks. Other Cakes and pastries. Butter and oils. The items listed above may not be a complete list of foods and beverages to avoid. Contact your dietitian for more information. WHAT ARE SOME TIPS FOR INCLUDING HIGH-FIBER  FOODS IN MY DIET?  Eat a wide variety of high-fiber foods.  Make sure that half of all grains consumed each day are whole grains.  Replace breads and cereals made from refined flour or white flour with whole-grain breads and cereals.  Replace white rice  with brown rice, bulgur wheat, or millet.  Start the day with a breakfast that is high in fiber, such as a cereal that contains at least 5 grams of fiber per serving.  Use beans in place of meat in soups, salads, or pasta.  Eat high-fiber snacks, such as berries, raw vegetables, nuts, or popcorn.   This information is not intended to replace advice given to you by your health care provider. Make sure you discuss any questions you have with your health care provider.   Document Released: 03/19/2005 Document Revised: 04/09/2014 Document Reviewed: 09/01/2013 Elsevier Interactive Patient Education [employer]2016 Elsevier Inc.

## 2015-09-20 NOTE — Progress Notes (Signed)
   Subjective:    Patient ID: Diana Watson, female    DOB: 06/27/1955, 60 y.o.   MRN: [id number]  HPI  The patient is a new 60 YO female coming in with several concerns. She is having constipation which is new in the last 6 months. She saw her previous doctor and they told her to try miralax and amitiza. She is taking those and not having good success. Given lactulose which she uses for when she cannot go for 3 days. She is having hard stools which are difficult to pass. Denies blood. No weight change in the last year noted. She has taken stool softener colace daily which is not that helpful. No change to her medicines at the onset or in her diet. Has never had colonoscopy.  Next concern is memory change which we are not able to adequately address. She admits to being tried on aricept which did not help and other medicine which gave her side effects. She does not know if she has been checked for any causes of the memory change. She admits that her mental health condition is under good control right now but last serious flare was last fall and lasted several months.  She also wants follow up on her medical conditions (please see A/P for status and treatment).   [facility] reviewed and updated.   Review of Systems  Constitutional: Negative for fever, activity change, appetite change, fatigue and unexpected weight change.  HENT: Negative.   Eyes: Negative.   Respiratory: Negative for cough, chest tightness and shortness of breath.   Cardiovascular: Negative for chest pain, palpitations and leg swelling.  Gastrointestinal: Positive for constipation. Negative for nausea, vomiting, abdominal pain, diarrhea, blood in stool and abdominal distention.  Musculoskeletal: Negative for back pain, joint swelling and arthralgias.  Skin: Negative.   Neurological: Negative for dizziness, weakness, light-headedness, numbness and headaches.  Psychiatric/Behavioral: Negative.       Objective:     Physical Exam  Constitutional: She is oriented to person, place, and time. She appears well-developed and well-nourished.  HENT:  Head: Normocephalic and atraumatic.  Eyes: EOM are normal.  Neck: Normal range of motion.  Cardiovascular: Normal rate and regular rhythm.   Pulmonary/Chest: Effort normal and breath sounds normal. No respiratory distress. She has no wheezes.  Abdominal: Soft. Bowel sounds are normal. She exhibits no distension. There is no tenderness. There is no rebound.  Musculoskeletal: She exhibits no edema.  Neurological: She is alert and oriented to person, place, and time.  Skin: Skin is warm and dry.  Foot exam done  Psychiatric: She has a normal mood and affect.  Some wandering history   Filed Vitals:   09/20/15 0858  BP: 138/80  Pulse: 74  Temp: 98.5 F (36.9 C)  TempSrc: Oral  Resp: 16  Height: 5\' 5"  (1.651 m)  Weight: 162 lb 12.8 oz (73.846 kg)  SpO2: 97%      Assessment & Plan:

## 2015-09-20 NOTE — Assessment & Plan Note (Signed)
BP at goal on lisinopril 60 mg daily. Will not refill without labs and if K high will decrease. Checking CMP and adjust as needed.

## 2015-09-20 NOTE — Progress Notes (Signed)
Pre visit review using our clinic review tool, if applicable. No additional management support is needed unless otherwise documented below in the visit note. 

## 2015-09-20 NOTE — Assessment & Plan Note (Signed)
Referral to GI as she needs colonoscopy for the abrupt change in her bowels. Certainly this could be related to medication but since she has not had colonoscopy this is needed and talked to her about that need today. She can continue with amitiza. Added today senokot-d 2 pills twice a day. She can stop the colace since it is included. She has stopped taking miralax since it is not helping. It is okay for her to use lactulose for when needed. Talked to her about fiber and water intake to help as well.

## 2015-09-20 NOTE — Assessment & Plan Note (Signed)
Seeing psych and is on disability for this condition which I will not treat and she understands that she will continue to see her psychiatrist. They are having her on benztropin, wellbutrin, geodon, lamictal.

## 2015-09-20 NOTE — Assessment & Plan Note (Signed)
Checking lipid panel with next visit as not fasting today. Getting records and adjust crestor 5 mg daily as needed.

## 2015-09-23 ENCOUNTER — Telehealth: Payer: Self-pay

## 2015-09-23 NOTE — Telephone Encounter (Signed)
Patient returned the call she got about her labs. I informed her of the notes. She understood. Yet she was confused about the thyroid results. She states she feels fine. And didn't know if she should or shouldn't take something for it. She had a lot of questions she wanted to ask about the thyroid. If you could please call her back and follow up.

## 2015-09-23 NOTE — Telephone Encounter (Signed)
Tried to return patient's call. No answer. Left message for patient to call back.

## 2015-11-07 ENCOUNTER — Encounter: Payer: Self-pay | Admitting: Internal Medicine

## 2015-11-07 ENCOUNTER — Telehealth: Payer: Self-pay | Admitting: Internal Medicine

## 2015-11-07 NOTE — Telephone Encounter (Signed)
Rec'd from [facility] forward 95 pages to Dr.Crawford

## 2015-11-08 ENCOUNTER — Telehealth: Payer: Self-pay | Admitting: Emergency Medicine

## 2015-11-08 ENCOUNTER — Other Ambulatory Visit: Payer: Self-pay | Admitting: Geriatric Medicine

## 2015-11-08 MED ORDER — ROSUVASTATIN CALCIUM 5 MG PO TABS: 5.0000 mg | ORAL_TABLET | Freq: Every day | ORAL | 3 refills | Status: DC

## 2015-11-08 NOTE — Telephone Encounter (Signed)
Sent to pharmacy 

## 2015-11-08 NOTE — Telephone Encounter (Signed)
Pt called and needs a prescription refill on rosuvastatin (CRESTOR) 5 MG tablet with a 90 day supply. Pharmacy is [facility]. Please follow up thanks.

## 2015-11-21 ENCOUNTER — Telehealth: Payer: Self-pay | Admitting: Emergency Medicine

## 2015-11-21 NOTE — Telephone Encounter (Signed)
Pt called and needs a prescription refill on lubiprostone (AMITIZA) 8 MCG capsule.[facility]. Please follow up thanks.

## 2015-11-22 ENCOUNTER — Other Ambulatory Visit: Payer: Self-pay | Admitting: Geriatric Medicine

## 2015-11-22 MED ORDER — LUBIPROSTONE 8 MCG PO CAPS: 8.0000 ug | ORAL_CAPSULE | Freq: Two times a day (BID) | ORAL | 3 refills | Status: DC

## 2015-11-22 NOTE — Telephone Encounter (Signed)
Sent to pharmacy 

## 2015-12-07 DIAGNOSIS — F319 Bipolar disorder, unspecified: Secondary | ICD-10-CM | POA: Diagnosis not present

## 2015-12-09 ENCOUNTER — Telehealth: Payer: Self-pay | Admitting: Internal Medicine

## 2015-12-09 MED ORDER — GLUCOSE BLOOD VI STRP: 1.0000 | ORAL_STRIP | Freq: Two times a day (BID) | 3 refills | Status: DC

## 2015-12-09 MED ORDER — ACARBOSE 25 MG PO TABS: 25.0000 mg | ORAL_TABLET | Freq: Three times a day (TID) | ORAL | 1 refills | Status: DC

## 2015-12-09 NOTE — Telephone Encounter (Signed)
[facility] on accu-chek test strips & acarbose. Sent to [facility],,,/lmb

## 2015-12-09 NOTE — Telephone Encounter (Signed)
Patient is requesting refill on acarbose 25mg  and accu chek test strips (test 2 x a day) to [facility].

## 2015-12-13 ENCOUNTER — Other Ambulatory Visit: Payer: Self-pay | Admitting: *Deleted

## 2015-12-13 MED ORDER — LISINOPRIL 30 MG PO TABS: 60.0000 mg | ORAL_TABLET | Freq: Every day | ORAL | 3 refills | Status: DC

## 2015-12-13 NOTE — Telephone Encounter (Signed)
rec'd call pt states she is needing refill on her Lisinopril 30 mg. Verified instructions & pharmacy [facility]Raechel Chute/lmb

## 2015-12-26 ENCOUNTER — Encounter: Payer: Self-pay | Admitting: Internal Medicine

## 2016-01-05 ENCOUNTER — Ambulatory Visit (INDEPENDENT_AMBULATORY_CARE_PROVIDER_SITE_OTHER): Payer: Commercial Managed Care - HMO | Admitting: Internal Medicine

## 2016-01-05 ENCOUNTER — Other Ambulatory Visit (INDEPENDENT_AMBULATORY_CARE_PROVIDER_SITE_OTHER): Payer: Commercial Managed Care - HMO

## 2016-01-05 ENCOUNTER — Encounter: Payer: Self-pay | Admitting: Internal Medicine

## 2016-01-05 VITALS — BP 132/76 | HR 81 | Temp 97.9°F | Resp 12 | Ht 65.0 in | Wt 163.8 lb

## 2016-01-05 DIAGNOSIS — R413 Other amnesia: Secondary | ICD-10-CM | POA: Diagnosis not present

## 2016-01-05 DIAGNOSIS — E1142 Type 2 diabetes mellitus with diabetic polyneuropathy: Secondary | ICD-10-CM | POA: Diagnosis not present

## 2016-01-05 DIAGNOSIS — Z23 Encounter for immunization: Secondary | ICD-10-CM

## 2016-01-05 LAB — HEMOGLOBIN A1C: Hgb A1c MFr Bld: 5.9 % (ref 4.6–6.5)

## 2016-01-05 MED ORDER — LEVOTHYROXINE SODIUM 50 MCG PO TABS: 50.0000 ug | ORAL_TABLET | Freq: Every day | ORAL | 3 refills | Status: DC

## 2016-01-05 MED ORDER — GLIPIZIDE 5 MG PO TABS
5.0000 mg | ORAL_TABLET | Freq: Two times a day (BID) | ORAL | 0 refills | Status: DC
Start: 2016-01-05 — End: 2016-03-16

## 2016-01-05 MED ORDER — DONEPEZIL HCL 10 MG PO TABS: 10.0000 mg | ORAL_TABLET | Freq: Every day | ORAL | 3 refills | Status: DC

## 2016-01-05 NOTE — Progress Notes (Signed)
Pre visit review using our clinic review tool, if applicable. No additional management support is needed unless otherwise documented below in the visit note. 

## 2016-01-05 NOTE — Patient Instructions (Addendum)
We have sent in a new glipizide (for the sugars) which is a lower dose to help avoid low sugars. It is 5 mg and you take 1 pill twice a day with meals.   We have sent in aricept 10 mg to take daily for the memory.   We have put in the request for the memory testing so that we can see if there is a cause of the memory changes.

## 2016-01-05 NOTE — Progress Notes (Signed)
   Subjective:    Patient ID: Diana BoringDonna Valdes-Jeffers, female    DOB: 08/12/1955, 60 y.o.   MRN: [id number]  HPI The patient is a 60 YO female coming in for follow up of her sugars. She is having some more low sugars. We had talked last time about decreasing her glipizide last time and she did not want to change doses. She is now having several episodes per week with sugar <80 and she is constantly eating to keep her sugar levels up >100. Her previous provider had given her 20 mg BID of glipizide. She denies new numbness in her feet but has some stable neuropathy.  She also wants to talk about her memory problems. She is noticing a lot of short term memory problems lately and is concerned about her memory. She does have family history of people with alzheimer's dementia diagnosed in the 9350-60s. She is also hearing from her husband that she is asking the same questions over and over. Her previous provider put her on aricept 5 mg and she did not notice a different. Then they tried another medicine which caused severe balance problems so she had to stop. She believes that she mental health is doing well and stable without medication changes recently.   Review of Systems  Constitutional: Positive for fatigue. Negative for activity change, appetite change, fever and unexpected weight change.  Respiratory: Negative.   Cardiovascular: Negative.   Gastrointestinal: Negative.   Endocrine:       Low sugars, shaking  Musculoskeletal: Negative.   Skin: Negative.   Neurological: Negative for dizziness, facial asymmetry, weakness, light-headedness and headaches.       Memory change  Psychiatric/Behavioral: Positive for decreased concentration. Negative for behavioral problems, dysphoric mood, hallucinations, self-injury, sleep disturbance and suicidal ideas. The patient is not nervous/anxious and is not hyperactive.       Objective:   Physical Exam  Constitutional: She is oriented to person, place, and time.  She appears well-developed and well-nourished.  HENT:  Head: Normocephalic and atraumatic.  Eyes: EOM are normal.  Neck: Normal range of motion.  Cardiovascular: Normal rate and regular rhythm.   Pulmonary/Chest: Effort normal and breath sounds normal. No respiratory distress. She has no wheezes. She has no rales.  Abdominal: Soft. Bowel sounds are normal. She exhibits no distension. There is no tenderness. There is no rebound.  Musculoskeletal: She exhibits no edema.  Neurological: She is alert and oriented to person, place, and time. Coordination normal.  MMSE high, were unable to complete full testing.   Skin: Skin is warm and dry.  Psychiatric: She has a normal mood and affect.  Some slowing of the thoughts   Vitals:   01/05/16 0902  BP: 132/76  Pulse: 81  Resp: 12  Temp: 97.9 F (36.6 C)  TempSrc: Oral  SpO2: 99%  Weight: 163 lb 12.8 oz (74.3 kg)  Height: 5\' 5"  (1.651 m)      Assessment & Plan:

## 2016-01-07 DIAGNOSIS — F0390 Unspecified dementia without behavioral disturbance: Secondary | ICD-10-CM | POA: Insufficient documentation

## 2016-01-07 DIAGNOSIS — F0150 Vascular dementia without behavioral disturbance: Secondary | ICD-10-CM | POA: Insufficient documentation

## 2016-01-07 NOTE — Assessment & Plan Note (Addendum)
Checking labs and will need to review her records for prior imaging. It is unclear on exam if some of the medication changes are due to her mental health medications or actual early dementia since this runs in her family. Rx for aricept 10 mg daily. Referral placed for neuropsych testing to help elucidate cause. Some concern too that low sugars could be clouding her thoughts.

## 2016-01-07 NOTE — Assessment & Plan Note (Signed)
Checking HgA1c and decrease glipizide to 5 mg BID from 20 mg BID as this is an inappropriately high dosage especially since her last HgA1c was <7. She is overtreated. Can continue the actos although she may be able to stop this as well if HgA1c continues to be below goal. She is complicated by some neuropathy which is stable. Reminded about yearly eye exam.

## 2016-01-25 ENCOUNTER — Telehealth: Payer: Self-pay | Admitting: Internal Medicine

## 2016-01-25 NOTE — Telephone Encounter (Signed)
[facility] - Client TELEPHONE ADVICE RECORD [facility]  Patient Name: Diana Watson  DOB: 10/26/1955    Initial Comment Caller was given a new medication for thyroid. She has not taken it bc she is concerned with side effects- She wants to be sure it won't effect her sugar.    Nurse Assessment  Nurse: Dorthula RuePatten, RN, Enrique SackKendra Date/Time (Eastern Time): 01/25/2016 12:14:43 PM  Confirm and document reason for call. If symptomatic, describe symptoms. You must click the next button to save text entered. ---Caller states she was prescribed a thyroid medication. She has not taken it yet. Caller states it's Levothyroxine. She has some general concerns about the medication and wants to know why the doctor prescribed it. Told her to call the actual office to find out if they prescribe it for low thyroid, because that would be the reason. She is also worried about it affecting her blood sugar and does not want to take it. She is a diabetic. She is also concerned about all the side effects and really wants to discuss all this with doctor. Told her I would schedule her an appointment to see her doctor so they can discuss best plan of action for the medication and address all her concerns then. She agrees. Not having symptoms.  Has the patient traveled out of the country within the last 30 days? ---Not Applicable  Does the patient have any new or worsening symptoms? ---No  Please document clinical information provided and list any resource used. ---Scheduled for 01/27/16 with Dr. Okey Duprerawford at [phone]     Guidelines    Guideline Title Affirmed Question Affirmed Notes       Final Disposition User

## 2016-01-27 ENCOUNTER — Encounter: Payer: Self-pay | Admitting: Internal Medicine

## 2016-01-27 ENCOUNTER — Ambulatory Visit (INDEPENDENT_AMBULATORY_CARE_PROVIDER_SITE_OTHER): Payer: Commercial Managed Care - HMO | Admitting: Internal Medicine

## 2016-01-27 DIAGNOSIS — R946 Abnormal results of thyroid function studies: Secondary | ICD-10-CM

## 2016-01-27 DIAGNOSIS — R7989 Other specified abnormal findings of blood chemistry: Secondary | ICD-10-CM | POA: Insufficient documentation

## 2016-01-27 NOTE — Progress Notes (Signed)
   Subjective:    Patient ID: Diana Watson, female    DOB: 04/09/1955, 60 y.o.   MRN: [id number]  HPI The patient is a 60 YO female coming in for questions about her thyroid levels on labs. Her numbers were borderline low. She has been looking up the medicine we sent in for her to try and she is concerned about it. She is worried because it would be tough for her to take it without other food or drink for 30 minutes and she does take calcium as well. She is not having any energy problems or cold or heat intolerance. She denies muscle cramps. She does have mild constipation at times.   Review of Systems  Constitutional: Negative.   Respiratory: Negative.   Cardiovascular: Negative.   Gastrointestinal: Positive for constipation. Negative for abdominal distention, abdominal pain, diarrhea and nausea.  Endocrine: Negative.   Neurological:       Short term memory changes  Psychiatric/Behavioral: Negative.       Objective:   Physical Exam  Constitutional: She is oriented to person, place, and time. She appears well-developed and well-nourished.  HENT:  Head: Normocephalic and atraumatic.  Eyes: EOM are normal.  Neck: No JVD present. No thyromegaly present.  Cardiovascular: Normal rate and regular rhythm.   Pulmonary/Chest: Effort normal and breath sounds normal.  Abdominal: Soft.  Musculoskeletal: She exhibits no edema.  Lymphadenopathy:    She has no cervical adenopathy.  Neurological: She is alert and oriented to person, place, and time.  Skin: Skin is warm and dry.   Vitals:   01/27/16 0925  BP: 134/70  Pulse: 83  Resp: 18  Temp: 98.4 F (36.9 C)  TempSrc: Oral  SpO2: 98%  Weight: 159 lb (72.1 kg)  Height: 5\' 5"  (1.651 m)      Assessment & Plan:

## 2016-01-27 NOTE — Patient Instructions (Signed)
We will not have you take the thyroid medicine and recheck the levels next time.

## 2016-01-27 NOTE — Progress Notes (Signed)
Pre visit review using our clinic review tool, if applicable. No additional management support is needed unless otherwise documented below in the visit note. 

## 2016-01-27 NOTE — Assessment & Plan Note (Signed)
Given her memory concerns this was why we recommended trial of synthroid but she declines and we will instead opt for close monitoring of her levels and treat only if necessary in the future.

## 2016-02-29 DIAGNOSIS — F319 Bipolar disorder, unspecified: Secondary | ICD-10-CM | POA: Diagnosis not present

## 2016-03-16 ENCOUNTER — Other Ambulatory Visit: Payer: Self-pay | Admitting: Internal Medicine

## 2016-03-23 ENCOUNTER — Ambulatory Visit: Payer: Self-pay | Admitting: Neurology

## 2016-04-06 ENCOUNTER — Encounter: Payer: Self-pay | Admitting: Internal Medicine

## 2016-04-06 ENCOUNTER — Other Ambulatory Visit (INDEPENDENT_AMBULATORY_CARE_PROVIDER_SITE_OTHER): Payer: Medicare HMO

## 2016-04-06 ENCOUNTER — Ambulatory Visit (INDEPENDENT_AMBULATORY_CARE_PROVIDER_SITE_OTHER): Payer: Self-pay | Admitting: Internal Medicine

## 2016-04-06 ENCOUNTER — Other Ambulatory Visit: Payer: Medicare HMO

## 2016-04-06 VITALS — BP 112/60 | HR 76 | Temp 98.6°F | Resp 12 | Ht 65.0 in | Wt 166.2 lb

## 2016-04-06 DIAGNOSIS — R413 Other amnesia: Secondary | ICD-10-CM | POA: Diagnosis not present

## 2016-04-06 DIAGNOSIS — R7989 Other specified abnormal findings of blood chemistry: Secondary | ICD-10-CM

## 2016-04-06 DIAGNOSIS — R946 Abnormal results of thyroid function studies: Secondary | ICD-10-CM

## 2016-04-06 DIAGNOSIS — E119 Type 2 diabetes mellitus without complications: Secondary | ICD-10-CM

## 2016-04-06 DIAGNOSIS — Z1211 Encounter for screening for malignant neoplasm of colon: Secondary | ICD-10-CM | POA: Diagnosis not present

## 2016-04-06 LAB — COMPREHENSIVE METABOLIC PANEL
ALK PHOS: 43 U/L (ref 39–117)
ALT: 21 U/L (ref 0–35)
AST: 24 U/L (ref 0–37)
Albumin: 4.4 g/dL (ref 3.5–5.2)
BILIRUBIN TOTAL: 0.4 mg/dL (ref 0.2–1.2)
BUN: 14 mg/dL (ref 6–23)
CALCIUM: 9.4 mg/dL (ref 8.4–10.5)
CO2: 31 meq/L (ref 19–32)
CREATININE: 1.05 mg/dL (ref 0.40–1.20)
Chloride: 100 mEq/L (ref 96–112)
GFR: 69.02 mL/min (ref >60.00)
Glucose, Bld: 121 mg/dL — ABNORMAL HIGH (ref 70–99)
Potassium: 4.3 mEq/L (ref 3.5–5.1)
Sodium: 137 mEq/L (ref 135–145)
TOTAL PROTEIN: 7.6 g/dL (ref 6.0–8.3)

## 2016-04-06 LAB — TSH: TSH: 0.68 u[IU]/mL (ref 0.35–4.50)

## 2016-04-06 LAB — HEMOGLOBIN A1C: Hgb A1c MFr Bld: 6 % (ref 4.6–6.5)

## 2016-04-06 LAB — T4, FREE: Free T4: 0.56 ng/dL — ABNORMAL LOW (ref 0.60–1.60)

## 2016-04-06 NOTE — Assessment & Plan Note (Signed)
Has not done the neuropsych testing at this time. Tolerating aricept 10 mg daily and will continue. She has not noticed any benefit at this time and we talked about how this typically does not boost memory but helps to preserve memory. It is not clear to me if some of this is related to her mental health but I suspect that it is.

## 2016-04-06 NOTE — Assessment & Plan Note (Signed)
Has not changed to glipizide 5 mg BID and not having low sugars anymore. She is also taking acarbose and doing well. Checking HgA1c today and adjust as needed. Not complicated at this time.

## 2016-04-06 NOTE — Patient Instructions (Signed)
We are checking the labs today and will call you back with the results.   We will wait on the memory test and see what that shows.   You will get a call about the colonoscopy.

## 2016-04-06 NOTE — Assessment & Plan Note (Signed)
Will check TSH and free T4 today. She is now willing to try thyroid medication but is not able to take until the evening due to her medication regimen and habits.

## 2016-04-06 NOTE — Progress Notes (Signed)
   Subjective:    Patient ID: Diana Watson, female    DOB: 05/29/1955, 61 y.o.   MRN: [id number]  HPI The patient is a 61 YO female coming in for follow up of her diabetes (taking the lower dose of glipizide now and no low sugars, denies high sugars, no numbness or burning pains, taking her acarbose as well) and her thyroid (declined medication, needs frequent monitoring, now is willing to take the medicine but would only be able to take it in the evening if needed) and her memory (she has started aricept 10 mg daily, not noticed any difference in memory, still a lot of problems with short term memory and asking the same questions, cannot do math well anymore, no side effects, getting her memory testing done in several weeks, more depressed due to SAD although she has not talked to her psych about this).   Review of Systems  Constitutional: Negative for activity change, appetite change, chills, fatigue, fever and unexpected weight change.  HENT: Negative.   Eyes: Negative.   Respiratory: Negative.   Cardiovascular: Negative.   Gastrointestinal: Negative.   Musculoskeletal: Negative.   Skin: Negative.   Neurological: Negative for dizziness, weakness, light-headedness, numbness and headaches.       Memory problems  Psychiatric/Behavioral: Positive for decreased concentration and dysphoric mood. Negative for agitation, behavioral problems, self-injury, sleep disturbance and suicidal ideas. The patient is not nervous/anxious.       Objective:   Physical Exam  Constitutional: She appears well-developed and well-nourished.  HENT:  Head: Normocephalic and atraumatic.  Eyes: EOM are normal.  Neck: Normal range of motion.  Cardiovascular: Normal rate and regular rhythm.   Pulmonary/Chest: Effort normal and breath sounds normal. No respiratory distress. She has no wheezes. She has no rales.  Abdominal: Soft. She exhibits no distension. There is no tenderness. There is no rebound.    Musculoskeletal: She exhibits no edema.  Neurological: She is alert. Coordination normal.  Skin: Skin is warm and dry.  Psychiatric: She has a normal mood and affect.   Vitals:   04/06/16 0911  BP: 112/60  Pulse: 76  Resp: 12  Temp: 98.6 F (37 C)  TempSrc: Oral  SpO2: 98%  Weight: 166 lb 4 oz (75.4 kg)  Height: 5\' 5"  (1.651 m)      Assessment & Plan:

## 2016-04-06 NOTE — Progress Notes (Signed)
Pre visit review using our clinic review tool, if applicable. No additional management support is needed unless otherwise documented below in the visit note. 

## 2016-04-09 ENCOUNTER — Encounter: Payer: Self-pay | Admitting: Gastroenterology

## 2016-04-20 ENCOUNTER — Ambulatory Visit: Payer: Self-pay | Admitting: Neurology

## 2016-05-21 ENCOUNTER — Other Ambulatory Visit: Payer: Self-pay | Admitting: Internal Medicine

## 2016-05-22 DIAGNOSIS — F314 Bipolar disorder, current episode depressed, severe, without psychotic features: Secondary | ICD-10-CM | POA: Diagnosis not present

## 2016-05-23 ENCOUNTER — Ambulatory Visit (AMBULATORY_SURGERY_CENTER): Payer: Self-pay | Admitting: *Deleted

## 2016-05-23 ENCOUNTER — Encounter: Payer: Self-pay | Admitting: Gastroenterology

## 2016-05-23 VITALS — Ht 65.0 in | Wt 158.0 lb

## 2016-05-23 DIAGNOSIS — Z1211 Encounter for screening for malignant neoplasm of colon: Secondary | ICD-10-CM

## 2016-05-23 MED ORDER — NA SULFATE-K SULFATE-MG SULF 17.5-3.13-1.6 GM/177ML PO SOLN: ORAL | 0 refills | Status: DC

## 2016-05-23 NOTE — Progress Notes (Addendum)
Patient denies any allergies to eggs or soy. Patient denies any problems with anesthesia/sedation. Patient denies any oxygen use at home and does not take any diet/weight loss medications. EMMI education declined by patient. No use of computer.  2 day prep given Suprep/Miralax due to constipation.

## 2016-06-06 ENCOUNTER — Encounter: Payer: Self-pay | Admitting: Gastroenterology

## 2016-06-06 ENCOUNTER — Ambulatory Visit (AMBULATORY_SURGERY_CENTER): Payer: Medicare HMO | Admitting: Gastroenterology

## 2016-06-06 VITALS — BP 120/66 | HR 74 | Temp 98.4°F | Resp 14 | Ht 65.0 in | Wt 158.0 lb

## 2016-06-06 DIAGNOSIS — Z1211 Encounter for screening for malignant neoplasm of colon: Secondary | ICD-10-CM | POA: Diagnosis present

## 2016-06-06 DIAGNOSIS — E119 Type 2 diabetes mellitus without complications: Secondary | ICD-10-CM | POA: Diagnosis not present

## 2016-06-06 DIAGNOSIS — K5730 Diverticulosis of large intestine without perforation or abscess without bleeding: Secondary | ICD-10-CM | POA: Diagnosis not present

## 2016-06-06 DIAGNOSIS — Z1212 Encounter for screening for malignant neoplasm of rectum: Secondary | ICD-10-CM

## 2016-06-06 DIAGNOSIS — F329 Major depressive disorder, single episode, unspecified: Secondary | ICD-10-CM | POA: Diagnosis not present

## 2016-06-06 DIAGNOSIS — I10 Essential (primary) hypertension: Secondary | ICD-10-CM | POA: Diagnosis not present

## 2016-06-06 DIAGNOSIS — F319 Bipolar disorder, unspecified: Secondary | ICD-10-CM | POA: Diagnosis not present

## 2016-06-06 MED ORDER — SODIUM CHLORIDE 0.9 % IV SOLN: 500.0000 mL | INTRAVENOUS | Status: DC

## 2016-06-06 NOTE — Progress Notes (Signed)
Pt's states no medical or surgical changes since previsit or office visit. 

## 2016-06-06 NOTE — Patient Instructions (Signed)
YOU HAD AN ENDOSCOPIC PROCEDURE TODAY AT THE [facility]:   Refer to the procedure report that was given to you for any specific questions about what was found during the examination.  If the procedure report does not answer your questions, please call your gastroenterologist to clarify.  If you requested that your care partner not be given the details of your procedure findings, then the procedure report has been included in a sealed envelope for you to review at your convenience later.  YOU SHOULD EXPECT: Some feelings of bloating in the abdomen. Passage of more gas than usual.  Walking can help get rid of the air that was put into your GI tract during the procedure and reduce the bloating. If you had a lower endoscopy (such as a colonoscopy or flexible sigmoidoscopy) you may notice spotting of blood in your stool or on the toilet paper. If you underwent a bowel prep for your procedure, you may not have a normal bowel movement for a few days.  Please Note:  You might notice some irritation and congestion in your nose or some drainage.  This is from the oxygen used during your procedure.  There is no need for concern and it should clear up in a day or so.  SYMPTOMS TO REPORT IMMEDIATELY:   Following lower endoscopy (colonoscopy or flexible sigmoidoscopy):  Excessive amounts of blood in the stool  Significant tenderness or worsening of abdominal pains  Swelling of the abdomen that is new, acute  Fever of 100F or higher   For urgent or emergent issues, a gastroenterologist can be reached at any hour by calling [phone].   DIET:  We do recommend a small meal at first, but then you may proceed to your regular diet.  Drink plenty of fluids but you should avoid alcoholic beverages for 24 hours.  ACTIVITY:  You should plan to take it easy for the rest of today and you should NOT DRIVE or use heavy machinery until tomorrow (because of the sedation medicines used during the test).     FOLLOW UP: Our staff will call the number listed on your records the next business day following your procedure to check on you and address any questions or concerns that you may have regarding the information given to you following your procedure. If we do not reach you, we will leave a message.  However, if you are feeling well and you are not experiencing any problems, there is no need to return our call.  We will assume that you have returned to your regular daily activities without incident.  If any biopsies were taken you will be contacted by phone or by letter within the next 1-3 weeks.  Please call us at [phone] if you have not heard about the biopsies in 3 weeks.    SIGNATURES/CONFIDENTIALITY: You and/or your care partner have signed paperwork which will be entered into your electronic medical record.  These signatures attest to the fact that that the information above on your After Visit Summary has been reviewed and is understood.  Full responsibility of the confidentiality of this discharge information lies with you and/or your care-partner.  Diverticulosis, high fiber diet information given.  Next colonoscopy 10 years-2028

## 2016-06-06 NOTE — Op Note (Signed)
[facility] Patient Name: Diana Watson Procedure Date: 06/06/2016 7:46 AM MRN: [id number] Endoscopist: Rachael Fee , MD Age: 61 Referring MD:  Date of Birth: 07-May-1955 Gender: Female Account #: [id number] Procedure:                Colonoscopy Indications:              Screening for colorectal malignant neoplasm Medicines:                Monitored Anesthesia Care Procedure:                Pre-Anesthesia Assessment:                           - Prior to the procedure, a History and Physical                            was performed, and patient medications and                            allergies were reviewed. The patient's tolerance of                            previous anesthesia was also reviewed. The risks                            and benefits of the procedure and the sedation                            options and risks were discussed with the patient.                            All questions were answered, and informed consent                            was obtained. Prior Anticoagulants: The patient has                            taken no previous anticoagulant or antiplatelet                            agents. ASA Grade Assessment: II - A patient with                            mild systemic disease. After reviewing the risks                            and benefits, the patient was deemed in                            satisfactory condition to undergo the procedure.                           After obtaining informed consent, the colonoscope  was passed under direct vision. Throughout the                            procedure, the patient's blood pressure, pulse, and                            oxygen saturations were monitored continuously. The                            Colonoscope was introduced through the anus and                            advanced to the the cecum, identified by                            appendiceal orifice  and ileocecal valve. The                            colonoscopy was performed without difficulty. The                            patient tolerated the procedure well. The quality                            of the bowel preparation was excellent. The                            ileocecal valve, appendiceal orifice, and rectum                            were photographed. Scope In: 8:27:53 AM Scope Out: 8:40:16 AM Scope Withdrawal Time: 0 hours 6 minutes 54 seconds  Total Procedure Duration: 0 hours 12 minutes 23 seconds  Findings:                 Multiple small-mouthed diverticula were found in                            the entire colon.                           The exam was otherwise without abnormality on                            direct and retroflexion views. Complications:            No immediate complications. Estimated blood loss:                            None. Estimated Blood Loss:     Estimated blood loss: none. Impression:               - Diverticulosis in the entire examined colon.                           - The examination was otherwise normal on direct  and retroflexion views.                           - No polyps or cancers. Recommendation:           - Patient has a contact number available for                            emergencies. The signs and symptoms of potential                            delayed complications were discussed with the                            patient. Return to normal activities tomorrow.                            Written discharge instructions were provided to the                            patient.                           - Resume previous diet.                           - Continue present medications.                           - Repeat colonoscopy in 10 years for screening                            purposes. Rachael Feeaniel P Masyn Rostro, MD 06/06/2016 8:42:24 AM This report has been signed electronically.

## 2016-06-07 ENCOUNTER — Telehealth: Payer: Self-pay | Admitting: *Deleted

## 2016-06-07 NOTE — Telephone Encounter (Signed)
  Follow up Call-  Call back number 06/06/2016  Post procedure Call Back phone  # [phone]  Permission to leave phone message Yes  Some recent data might be hidden     Patient questions:  Do you have a fever, pain , or abdominal swelling? No. Pain Score  0 *  Have you tolerated food without any problems? Yes.    Have you been able to return to your normal activities? Yes.    Do you have any questions about your discharge instructions: Diet   No. Medications  No. Follow up visit  No.  Do you have questions or concerns about your Care? No.  Actions: * If pain score is 4 or above: No action needed, pain <4.

## 2016-06-07 NOTE — Telephone Encounter (Signed)
  Follow up Call-  Call back number 06/06/2016  Post procedure Call Back phone  # [phone]  Permission to leave phone message Yes  Some recent data might be hidden     Patient questions:  Do you have a fever, pain , or abdominal swelling? No. Pain Score  0 *  Have you tolerated food without any problems? Yes.    Have you been able to return to your normal activities? Yes.    Do you have any questions about your discharge instructions: Diet   No. Medications  No. Follow up visit  No.  Do you have questions or concerns about your Care? No.  Actions: * If pain score is 4 or above: No action needed, pain <4.  

## 2016-06-19 ENCOUNTER — Other Ambulatory Visit: Payer: Self-pay | Admitting: Internal Medicine

## 2016-06-19 DIAGNOSIS — Z1231 Encounter for screening mammogram for malignant neoplasm of breast: Secondary | ICD-10-CM

## 2016-07-05 ENCOUNTER — Ambulatory Visit: Payer: Self-pay | Admitting: Neurology

## 2016-07-10 ENCOUNTER — Ambulatory Visit (INDEPENDENT_AMBULATORY_CARE_PROVIDER_SITE_OTHER): Payer: Medicare HMO | Admitting: Internal Medicine

## 2016-07-10 ENCOUNTER — Encounter: Payer: Self-pay | Admitting: Internal Medicine

## 2016-07-10 ENCOUNTER — Other Ambulatory Visit (INDEPENDENT_AMBULATORY_CARE_PROVIDER_SITE_OTHER): Payer: Medicare HMO

## 2016-07-10 VITALS — BP 138/88 | HR 72 | Temp 98.3°F | Resp 12 | Ht 65.0 in | Wt 168.0 lb

## 2016-07-10 DIAGNOSIS — E119 Type 2 diabetes mellitus without complications: Secondary | ICD-10-CM

## 2016-07-10 DIAGNOSIS — R946 Abnormal results of thyroid function studies: Secondary | ICD-10-CM

## 2016-07-10 DIAGNOSIS — R7989 Other specified abnormal findings of blood chemistry: Secondary | ICD-10-CM

## 2016-07-10 LAB — T4, FREE: Free T4: 0.67 ng/dL (ref 0.60–1.60)

## 2016-07-10 LAB — HEMOGLOBIN A1C: HEMOGLOBIN A1C: 6.1 % (ref 4.6–6.5)

## 2016-07-10 LAB — TSH: TSH: 0.99 u[IU]/mL (ref 0.35–4.50)

## 2016-07-10 NOTE — Progress Notes (Signed)
Pre visit review using our clinic review tool, if applicable. No additional management support is needed unless otherwise documented below in the visit note. 

## 2016-07-10 NOTE — Progress Notes (Signed)
   Subjective:    Patient ID: Diana Watson, female    DOB: 1955-11-13, 61 y.o.   MRN: [id number]  HPI The patient is a 61 YO female coming in for follow up of her diabetes (doing acarbose and glipizide and denies low sugars, still working on diet, no new problems), and her thyroid (feeling well, no low energy or weight change). Denies new problems or changes. No side effects with medications. Mood is feeling better than last visit.   Review of Systems  Constitutional: Negative.   Respiratory: Negative.   Cardiovascular: Negative.   Gastrointestinal: Negative.   Skin: Negative.   Neurological: Negative.       Objective:   Physical Exam  Constitutional: She is oriented to person, place, and time. She appears well-developed and well-nourished.  HENT:  Head: Normocephalic and atraumatic.  Eyes: EOM are normal.  Neck: Normal range of motion.  Cardiovascular: Normal rate and regular rhythm.   Pulmonary/Chest: Effort normal. No respiratory distress. She has no wheezes. She has no rales.  Abdominal: Soft.  Neurological: She is alert and oriented to person, place, and time.  Skin: Skin is warm and dry.   Vitals:   07/10/16 0932  BP: 138/88  Pulse: 72  Resp: 12  Temp: 98.3 F (36.8 C)  TempSrc: Oral  SpO2: 100%  Weight: 168 lb (76.2 kg)  Height:  (1.651 m)      Assessment & Plan:

## 2016-07-10 NOTE — Assessment & Plan Note (Signed)
Checking TSH and free T4 for changes. Due to mental health medications puts her more at risk for this condition. Not on meds currently.

## 2016-07-10 NOTE — Assessment & Plan Note (Signed)
Taking acarbose and glipizide. Checking HgA1c and doing well overall. Denies new complications from prior.

## 2016-07-31 ENCOUNTER — Encounter: Payer: Self-pay | Admitting: Internal Medicine

## 2016-07-31 ENCOUNTER — Other Ambulatory Visit: Payer: Self-pay | Admitting: Internal Medicine

## 2016-07-31 DIAGNOSIS — H52203 Unspecified astigmatism, bilateral: Secondary | ICD-10-CM | POA: Diagnosis not present

## 2016-07-31 DIAGNOSIS — E119 Type 2 diabetes mellitus without complications: Secondary | ICD-10-CM | POA: Diagnosis not present

## 2016-07-31 DIAGNOSIS — H5203 Hypermetropia, bilateral: Secondary | ICD-10-CM | POA: Diagnosis not present

## 2016-07-31 DIAGNOSIS — H524 Presbyopia: Secondary | ICD-10-CM | POA: Diagnosis not present

## 2016-07-31 LAB — HM DIABETES EYE EXAM

## 2016-07-31 NOTE — Progress Notes (Unsigned)
Results entered and sent to scan  

## 2016-08-06 DIAGNOSIS — F314 Bipolar disorder, current episode depressed, severe, without psychotic features: Secondary | ICD-10-CM | POA: Diagnosis not present

## 2016-08-10 ENCOUNTER — Other Ambulatory Visit: Payer: Self-pay | Admitting: Internal Medicine

## 2016-08-21 ENCOUNTER — Ambulatory Visit
Admission: RE | Admit: 2016-08-21 | Discharge: 2016-08-21 | Disposition: A | Discharge status: Home / Self Care | Payer: Medicare HMO | Source: Ambulatory Visit | Attending: Internal Medicine | Admitting: Internal Medicine

## 2016-08-21 DIAGNOSIS — Z1231 Encounter for screening mammogram for malignant neoplasm of breast: Secondary | ICD-10-CM

## 2016-09-08 ENCOUNTER — Other Ambulatory Visit: Payer: Self-pay | Admitting: Internal Medicine

## 2016-09-11 ENCOUNTER — Other Ambulatory Visit: Payer: Self-pay | Admitting: Internal Medicine

## 2016-09-12 ENCOUNTER — Other Ambulatory Visit: Payer: Self-pay | Admitting: *Deleted

## 2016-09-12 MED ORDER — LISINOPRIL 30 MG PO TABS: 60.0000 mg | ORAL_TABLET | Freq: Every day | ORAL | 2 refills | Status: DC

## 2016-09-12 MED ORDER — GLIPIZIDE 5 MG PO TABS: ORAL_TABLET | ORAL | 2 refills | Status: DC

## 2016-09-12 MED ORDER — DONEPEZIL HCL 10 MG PO TABS: 10.0000 mg | ORAL_TABLET | Freq: Every day | ORAL | 2 refills | Status: DC

## 2016-09-12 NOTE — Telephone Encounter (Signed)
Pt left msg on triage requesting refills on the Lisinopril, Glipizide, and Aricept sent to [facility]. Verified pt chart she is up-to-date sent refill to [facility]...Raechel Chute/LMB

## 2016-09-27 ENCOUNTER — Ambulatory Visit (INDEPENDENT_AMBULATORY_CARE_PROVIDER_SITE_OTHER): Payer: Medicare HMO | Admitting: Neurology

## 2016-09-27 ENCOUNTER — Encounter: Payer: Self-pay | Admitting: Neurology

## 2016-09-27 VITALS — BP 128/80 | HR 76 | Ht 65.0 in | Wt 162.0 lb

## 2016-09-27 DIAGNOSIS — R413 Other amnesia: Secondary | ICD-10-CM | POA: Diagnosis not present

## 2016-09-27 NOTE — Progress Notes (Signed)
NEUROLOGY CONSULTATION NOTE  Diana Watson MRN: [id number] DOB: [birth year]  Referring provider: Dr. Hillard Danker Primary care provider: Dr. Hillard Danker  Reason for consult:  Memory loss  Dear Dr Okey Dupre:  Thank you for your kind referral of Diana Watson for consultation of the above symptoms. Although her history is well known to you, please allow me to reiterate it for the purpose of our medical record. Records and images were personally reviewed where available.  HISTORY OF PRESENT ILLNESS: This is a 61 year old right-handed woman with a history of hypertension, hyperlipidemia, diabetes, bipolar disorder, presenting for evaluation of worsening memory. She started noticing symptoms around 2 years ago and attributed it to being busy since they were mild. She noticed worsening and was started on Aricept which was not helping. She recalls trying Galantamine which caused trouble walking almost causing her to fall. This resolved after she stopped the medication. She lives with her husband and now writes down when he has a question because he would tell her that she repeats herself. She had stopped going to her exercise classes because after a few minutes she would not recall the steps. Her instructor also noticed it so she stopped because she felt like she was being singled out. She reports she used to know 20 scriptures by heart but now does not know how they go. She does not drive, but reports getting lost looking for where their car is parked. She leaves her pocketbook a lot when she pays or has forgotten groceries. She misplaces things frequently. She had been in the top 10% of a national major math competition but now has difficulties counting backwards or doing simple math. She would not remember what was said in a meeting. She stopped working at age 44 or 55 due to her bipolar disorder. She denies any missed bills but has to check behind herself because she finds  she would make mistakes. She reports depression is under control but she is "just unhappy with how things are going."   She has chronic constipation. She had some tremors that stopped with Cogentin. She denies any headaches, dizziness, diplopia, dysarthia/dysphagia, neck/back pain, focal numbness/tingling/weakness, bladder dysfunction, anosmia. No falls. No family history of dementia. No history of significant head injury or alcohol [place].   Laboratory Data: Lab Results  Component Value Date   TSH 0.99 07/10/2016   Lab Results  Component Value Date   VITAMINB12 886 09/20/2015      PAST MEDICAL HISTORY: Past Medical History:  Diagnosis Date  . Bipolar 1 disorder (HCC)   . Constipation   . Depression   . Diabetes mellitus   . Herniated disc   . Hyperlipidemia   . Hypertension   . Personality disorder     PAST SURGICAL HISTORY: Past Surgical History:  Procedure Laterality Date  . DILATION AND CURETTAGE OF UTERUS    . RIGHT OOPHORECTOMY     Also removed a tumor at that time.    MEDICATIONS: Current Outpatient Prescriptions on File Prior to Visit  Medication Sig Dispense Refill  . acarbose (PRECOSE) 25 MG tablet Take 1 tablet (25 mg total) by mouth 3 (three) times daily with meals. 270 tablet 1  . aspirin 81 MG tablet Take 81 mg by mouth daily.    . benztropine (COGENTIN) 0.5 MG tablet Take 0.5 mg by mouth daily.    Marland Kitchen buPROPion (WELLBUTRIN XL) 300 MG 24 hr tablet Take 300 mg by mouth daily.    . Calcium Citrate-Vitamin  D (CALCIUM CITRATE + PO) Take by mouth.    . DOCUSATE SODIUM PO Take by mouth.    . donepezil (ARICEPT) 10 MG tablet Take 1 tablet (10 mg total) by mouth at bedtime. 90 tablet 2  . glipiZIDE (GLUCOTROL) 5 MG tablet TAKE 1 TABLET TWICE DAILY BEFORE A MEAL 180 tablet 2  . glucose blood (ACCU-CHEK AVIVA) test strip 1 each by Other route 2 (two) times daily. Use to check blood sugars twice a day Dx E11.9 300 each 3  . lactulose (CHRONULAC) 10 GM/15ML solution Take  15 mLs (10 g total) by mouth daily as needed for mild constipation. 946 mL 11  . lamoTRIgine (LAMICTAL) 200 MG tablet Take 200 mg by mouth every morning. For mood control    . lisinopril (PRINIVIL,ZESTRIL) 30 MG tablet Take 2 tablets (60 mg total) by mouth daily. 180 tablet 2  . lubiprostone (AMITIZA) 8 MCG capsule Take 1 capsule (8 mcg total) by mouth 2 (two) times daily with a meal. 180 capsule 3  . Multiple Vitamin (MULTIVITAMIN WITH MINERALS) TABS Take 1 tablet by mouth every morning.     . naproxen (NAPROSYN) 500 MG tablet Take 500 mg by mouth 3 (three) times daily as needed. For pain    . rosuvastatin (CRESTOR) 5 MG tablet TAKE 1 TABLET (5 MG TOTAL) BY MOUTH DAILY. 90 tablet 3  . ziprasidone (GEODON) 80 MG capsule Take 160 mg by mouth at bedtime.      No current facility-administered medications on file prior to visit.     ALLERGIES: Allergies  Allergen Reactions  . Prednisone Anxiety  . Lactose Intolerance (Gi) Diarrhea    FAMILY HISTORY: Family History  Problem Relation Age of Onset  . Diabetes type II Mother   . Hypertension Mother   . OCD Mother   . Bipolar disorder Mother   . Diabetes type II Father   . Hypertension Father   . Bipolar disorder Cousin   . Colon cancer Neg Hx     SOCIAL HISTORY: Social History   Social History  . Marital status: Married    Spouse name: N/A  . Number of children: N/A  . Years of education: N/A   Occupational History  . Not on file.   Social History Main Topics  . Smoking status: Never Smoker  . Smokeless tobacco: Never Used  . Alcohol use No  . Drug use: No  . Sexual activity: Yes    Birth control/ protection: None   Other Topics Concern  . Not on file   Social History Narrative  . No narrative on file    REVIEW OF SYSTEMS: Constitutional: No fevers, chills, or sweats, no generalized fatigue, change in appetite Eyes: No visual changes, double vision, eye pain Ear, nose and throat: No hearing loss, ear pain, nasal  congestion, sore throat Cardiovascular: No chest pain, palpitations Respiratory:  No shortness of breath at rest or with exertion, wheezes GastrointestinaI: No nausea, vomiting, diarrhea, abdominal pain, fecal incontinence Genitourinary:  No dysuria, urinary retention or frequency Musculoskeletal:  No neck pain, +back pain Integumentary: No rash, pruritus, skin lesions Neurological: as above Psychiatric: No depression, insomnia, anxiety Endocrine: No palpitations, fatigue, diaphoresis, mood swings, change in appetite, change in weight, increased thirst Hematologic/Lymphatic:  No anemia, purpura, petechiae. Allergic/Immunologic: no itchy/runny eyes, nasal congestion, recent allergic reactions, rashes  PHYSICAL EXAM: Vitals:   09/27/16 1017  BP: 128/80  Pulse: 76   General: No acute distress Head:  Normocephalic/atraumatic Eyes: Fundoscopic exam shows  bilateral sharp discs, no vessel changes, exudates, or hemorrhages Neck: supple, no paraspinal tenderness, full range of motion Back: No paraspinal tenderness Heart: regular rate and rhythm Lungs: Clear to auscultation bilaterally. Vascular: No carotid bruits. Skin/Extremities: No rash, no edema Neurological Exam: Mental status: alert and oriented to person, place, and time, no dysarthria or aphasia, Fund of knowledge is appropriate.  Recent and remote memory are intact.  Attention and concentration are normal.    Able to name objects and repeat phrases.  Montreal Cognitive Assessment  09/27/2016  Visuospatial/ Executive (0/5) 4  Naming (0/3) 3  Attention: Read list of digits (0/2) 2  Attention: Read list of letters (0/1) 1  Attention: Serial 7 subtraction starting at 100 (0/3) 3  Language: Repeat phrase (0/2) 2  Language : Fluency (0/1) 1  Abstraction (0/2) 2  Delayed Recall (0/5) 5  Orientation (0/6) 6  Total 29   Cranial nerves: CN I: not tested CN II: pupils equal, round and reactive to light, visual fields intact, fundi  unremarkable. CN III, IV, VI:  full range of motion, no nystagmus, no ptosis CN V: facial sensation intact CN VII: upper and lower face symmetric CN VIII: hearing intact to finger rub CN IX, X: gag intact, uvula midline CN XI: sternocleidomastoid and trapezius muscles intact CN XII: tongue midline Bulk & Tone: normal, +cogwheeling bilaterally, no fasciculations. Motor: 5/5 throughout with no pronator drift. Sensation: intact to light touch, cold, pin, vibration and joint position sense.  No extinction to double simultaneous stimulation.  Romberg test negative Deep Tendon Reflexes: brisk +2 throughout, no ankle clonus, negative Hoffman sign Plantar responses: downgoing bilaterally Cerebellar: no incoordination on finger to nose, heel to shin. No dysdiadochokinesia Gait: narrow-based and steady, able to tandem walk adequately. Tremor: none  IMPRESSION: This is a 61 year old right-handed woman with vascular risk factors including hypertension, hyperlipidemia, diabetes, history of bipolar disorder, presenting for worsening memory. Her neurological exam is largely non-focal with note of brisk reflexes, MOCA score normal 29/30. We discussed different causes of memory loss. TSH and B12 normal. MRI brain without contrast will be ordered to assess for underlying structural abnormality and assess vascular load. We discussed how mood can also affect cognition. She will be scheduled for Neurocognitive testing with Dr. Alinda DoomsBailar to further evaluate cognitive complaints. We discussed the importance of control of vascular risk factors, physical exercise, and brain stimulation exercises for brain health. She will follow-up after the tests.   Thank you for allowing me to participate in the care of this patient. Please do not hesitate to call for any questions or concerns.   Patrcia DollyKaren Laquinn Shippy, M.D.  CC: Dr. Okey Duprerawford

## 2016-09-27 NOTE — Patient Instructions (Addendum)
1. Schedule MRI brain without contrast  We have sent a referral to [facility] for your [facility] and they will call you directly to schedule your appt. They are located at [address] If you need to contact them directly please call [phone].  2. Schedule Neurocognitive testing with Dr. Alinda DoomsBailar 3. Control of blood pressure, cholesterol, diabetes, as well as physical exercise and brain stimulation exercises are important for brain health 4. Follow-up after tests  You have been referred for a neurocognitive evaluation in our office.   The evaluation consists of three appointments.   1. The first appointment is about 45 minutes and is a clinical interview with the neuropsychologist (Dr. Elvis CoilMaryBeth Bailar). You can bring someone with you to this appointment, as it is helpful for Dr. Alinda DoomsBailar to hear from both you and another adult who knows you well.   2. The second appointment is 2-3 hours long and is with the psychometrician Wallace Keller(Dana Chamberlain). You will complete a variety of tasks- mostly question-and-answer, some paper-and-pencil, some on the computer. There is nothing you need to do to prepare for this appointment, but having a good night's sleep prior to the testing, and bringing eyeglasses and hearing aids (if you wear them), is advised.   3. The final appointment is a follow-up with Dr. Alinda DoomsBailar where she will go over the test results with you and provide recommendations. This appointment is about 30 minutes.  If you would like a family member to receive this information as well, please bring them to the appointment.   We have to reserve several hours of the neuropsychologist's time and the psychometrician's time for your appointment. As such, please note that there is a No-Show fee of $100. If you are unable to attend any of your appointments, please contact our office as soon as possible to reschedule.

## 2016-10-05 ENCOUNTER — Encounter: Payer: Self-pay | Admitting: Nurse Practitioner

## 2016-10-05 ENCOUNTER — Ambulatory Visit: Payer: Self-pay | Admitting: Internal Medicine

## 2016-10-05 ENCOUNTER — Ambulatory Visit (INDEPENDENT_AMBULATORY_CARE_PROVIDER_SITE_OTHER): Payer: Medicare HMO | Admitting: Nurse Practitioner

## 2016-10-05 VITALS — BP 122/80 | HR 72 | Temp 98.2°F | Ht 65.0 in | Wt 166.0 lb

## 2016-10-05 DIAGNOSIS — E119 Type 2 diabetes mellitus without complications: Secondary | ICD-10-CM | POA: Diagnosis not present

## 2016-10-05 NOTE — Progress Notes (Signed)
Subjective:  Patient ID: Diana Watson, female    DOB: 10-20-1955  Age: 61 y.o. MRN: [id number]  CC: Follow-up (follow up/A1c and Lipid lab?)   HPI  DM: Home glucose: AM 120s-130s, PM 100s. Denies any hypoglycemic episodes. Last Hgba1c 2months ago: 6.1 Wants hgb A1c repeated if it was time.  Outpatient Medications Prior to Visit  Medication Sig Dispense Refill  . acarbose (PRECOSE) 25 MG tablet Take 1 tablet (25 mg total) by mouth 3 (three) times daily with meals. 270 tablet 1  . aspirin 81 MG tablet Take 81 mg by mouth daily.    . benztropine (COGENTIN) 0.5 MG tablet Take 0.5 mg by mouth daily.    Marland Kitchen buPROPion (WELLBUTRIN XL) 300 MG 24 hr tablet Take 300 mg by mouth daily.    . Calcium Citrate-Vitamin D (CALCIUM CITRATE + PO) Take by mouth.    . DOCUSATE SODIUM PO Take by mouth.    . donepezil (ARICEPT) 10 MG tablet Take 1 tablet (10 mg total) by mouth at bedtime. 90 tablet 2  . glipiZIDE (GLUCOTROL) 5 MG tablet TAKE 1 TABLET TWICE DAILY BEFORE A MEAL 180 tablet 2  . glucose blood (ACCU-CHEK AVIVA) test strip 1 each by Other route 2 (two) times daily. Use to check blood sugars twice a day Dx E11.9 300 each 3  . lactulose (CHRONULAC) 10 GM/15ML solution Take 15 mLs (10 g total) by mouth daily as needed for mild constipation. 946 mL 11  . lamoTRIgine (LAMICTAL) 200 MG tablet Take 200 mg by mouth every morning. For mood control    . lisinopril (PRINIVIL,ZESTRIL) 30 MG tablet Take 2 tablets (60 mg total) by mouth daily. 180 tablet 2  . lubiprostone (AMITIZA) 8 MCG capsule Take 1 capsule (8 mcg total) by mouth 2 (two) times daily with a meal. 180 capsule 3  . Multiple Vitamin (MULTIVITAMIN WITH MINERALS) TABS Take 1 tablet by mouth every morning.     . naproxen (NAPROSYN) 500 MG tablet Take 500 mg by mouth 3 (three) times daily as needed. For pain    . rosuvastatin (CRESTOR) 5 MG tablet TAKE 1 TABLET (5 MG TOTAL) BY MOUTH DAILY. 90 tablet 3  . ziprasidone (GEODON) 80 MG capsule  Take 160 mg by mouth at bedtime.      No facility-administered medications prior to visit.     ROS See HPI  Objective:  BP 122/80   Pulse 72   Temp 98.2 F (36.8 C)   Ht 5\' 5"  (1.651 m)   Wt 166 lb (75.3 kg)   SpO2 99%   BMI 27.62 kg/m   BP Readings from Last 3 Encounters:  10/05/16 122/80  09/27/16 128/80  07/10/16 138/88    Wt Readings from Last 3 Encounters:  10/05/16 166 lb (75.3 kg)  09/27/16 162 lb (73.5 kg)  07/10/16 168 lb (76.2 kg)    Physical Exam  Constitutional: She is oriented to person, place, and time. No distress.  Neck: Normal range of motion. Neck supple.  Cardiovascular: Normal rate.   Pulmonary/Chest: Effort normal. No respiratory distress.  Musculoskeletal: She exhibits no edema.  Neurological: She is alert and oriented to person, place, and time.  Skin: Skin is warm and dry.  Psychiatric: She has a normal mood and affect. Her behavior is normal.  Vitals reviewed.   Lab Results  Component Value Date   WBC 5.4 09/20/2015   HGB 13.5 09/20/2015   HCT 40.3 09/20/2015   PLT 217.0 09/20/2015   GLUCOSE  121 (H) 04/06/2016   ALT 21 04/06/2016   AST 24 04/06/2016   NA 137 04/06/2016   K 4.3 04/06/2016   CL 100 04/06/2016   CREATININE 1.05 04/06/2016   BUN 14 04/06/2016   CO2 31 04/06/2016   TSH 0.99 07/10/2016   HGBA1C 6.1 07/10/2016   MICROALBUR 0.50 10/04/2009    Mm Digital Screening Bilateral  Result Date: 08/21/2016 CLINICAL DATA:  Screening. EXAM: DIGITAL SCREENING BILATERAL MAMMOGRAM WITH CAD COMPARISON:  Previous exam(s). ACR Breast Density Category b: There are scattered areas of fibroglandular density. FINDINGS: There are no findings suspicious for malignancy. Images were processed with CAD. IMPRESSION: No mammographic evidence of malignancy. A result letter of this screening mammogram will be mailed directly to the patient. RECOMMENDATION: Screening mammogram in one year. (Code:SM-B-01Y) BI-RADS CATEGORY  1: Negative.  Electronically Signed   By: Bary RichardStan  Maynard M.D.   On: 08/21/2016 16:28    Assessment & Plan:   Diana Watson was seen today for follow-up.  Diagnoses and all orders for this visit:  Type 2 diabetes mellitus without complication, without long-term current use of insulin (HCC)   I am having Diana Watson maintain her ziprasidone, multivitamin with minerals, lamoTRIgine, naproxen, buPROPion, Calcium Citrate-Vitamin D (CALCIUM CITRATE + PO), aspirin, DOCUSATE SODIUM PO, benztropine, lactulose, lubiprostone, acarbose, glucose blood, rosuvastatin, donepezil, glipiZIDE, lisinopril, and busPIRone.  Meds ordered this encounter  Medications  . busPIRone (BUSPAR) 5 MG tablet    Sig: Take 5 mg by mouth 2 (two) times daily.    Follow-up: Return if symptoms worsen or fail to improve.  Alysia Pennaharlotte Theron Cumbie, NP

## 2016-10-05 NOTE — Patient Instructions (Addendum)
Patient declined lipid panel testing today.  You can also monitor diabetes status by check glucose once or twice a day.  Hgb A1c is repeated every 3-316months.  contiue current medications as prescribed

## 2016-10-24 ENCOUNTER — Other Ambulatory Visit: Payer: Self-pay | Admitting: Internal Medicine

## 2016-11-05 DIAGNOSIS — F314 Bipolar disorder, current episode depressed, severe, without psychotic features: Secondary | ICD-10-CM | POA: Diagnosis not present

## 2016-11-13 ENCOUNTER — Encounter: Payer: Self-pay | Admitting: Psychology

## 2016-11-21 ENCOUNTER — Other Ambulatory Visit: Payer: Self-pay

## 2016-12-24 ENCOUNTER — Ambulatory Visit
Admission: RE | Admit: 2016-12-24 | Discharge: 2016-12-24 | Disposition: A | Discharge status: Home / Self Care | Payer: Medicare HMO | Source: Ambulatory Visit | Attending: Neurology | Admitting: Neurology

## 2016-12-24 DIAGNOSIS — R410 Disorientation, unspecified: Secondary | ICD-10-CM | POA: Diagnosis not present

## 2016-12-24 DIAGNOSIS — R413 Other amnesia: Secondary | ICD-10-CM

## 2016-12-27 ENCOUNTER — Telehealth: Payer: Self-pay

## 2016-12-27 NOTE — Telephone Encounter (Signed)
-----   Message from Van Clines, MD sent at 12/27/2016 11:37 AM EDT ----- Pls let her know the MRI brain did not show any evidence of tumor, stroke, or bleed. It showed age-related changes, and changes seen in patients with blood pressure, cholesterol, and diabetes issues. Proceed with Neurocognitive testing as planned. Thanks

## 2016-12-27 NOTE — Telephone Encounter (Signed)
LMOM relaying message below.  

## 2017-01-05 ENCOUNTER — Other Ambulatory Visit: Payer: Self-pay | Admitting: Internal Medicine

## 2017-01-10 ENCOUNTER — Encounter: Payer: Self-pay | Admitting: Psychology

## 2017-01-17 ENCOUNTER — Ambulatory Visit: Payer: Self-pay | Admitting: Neurology

## 2017-01-18 ENCOUNTER — Other Ambulatory Visit: Payer: Self-pay | Admitting: Internal Medicine

## 2017-01-20 NOTE — Progress Notes (Signed)
NEUROPSYCHOLOGICAL INTERVIEW (CPT: [id number])  Name: Diana Watson Date of Birth: [birth year] Date of Interview: 01/20/2017  Reason for Referral:  Diana Watson is a 61 y.o. right handed female who is referred for neuropsychological evaluation by Dr. Patrcia Dolly of [facility] due to concerns about memory loss. This patient is unaccompanied in the office for today's visit.  History of Presenting Problem:  Diana Watson was seen for initial neurologic consultation of memory loss by Dr. Karel Jarvis on 09/27/2016. MoCA was 29/30. She completed a brain MRI on 12/24/2016 which reportedly revealed no acute abnormalities but noted atrophy and white matter changes moderately advanced for age, possibly representing chronic microvascular ischemic disease given her vascular risk factors.  The patient has no family history of dementia. She has no personal history of head injury or significant alcohol/substance use. She does have a history of bipolar disorder (formally diagnosed at age 89 after years of manic episodes). She reported she had strong cognitive functioning when she was younger, performing in the top 10% of the PSAT. She reports onset of cognitive difficulties about two years ago with progressive worsening over time. She reports the cognitive difficulties are now interfering with quality of life.  She reports reduced reading comprehension, forgetfulness for information she used to know (e.g., Bible scriptures), repeating herself (her husband reportedly notices this), forgetfulness for things she is told, misplacing and losing important items, losing train of thought when speaking, trouble following through on multi step tasks, increased distractibility to external stimuli, processing information more slowly, word finding difficulty, spelling difficulty, getting lost in the parking lot when looking for her car, and reduced ability to recall driving directions.  She lives with her  husband in an apartment. She does not drive (she drove briefly in college but had difficulty with it so never pursued it after that). She manages her medications with a pillbox and denies any difficulty with this. She manages the finances/bills and has more of a tendency to make errors. She does not forget or miss appointments.   She reports chronically depressed mood. She very rarely experiences joy or positive emotion. She has not had any manic episodes for a long time but had these regularly in her 20s and 30s. She was hospitalized 3-4 times for bipolar disorder in the past (due to aggression, uncontrollable spending sprees, and medication noncompliance). She was experiencing suicidal ideation about two years ago and her husband had to hide her medications from her so she would not overdose. She denies any suicidal ideation recently. She denies any sleep difficulty. She takes medication for this. She denies any history of visual or auditory hallucinations. She sees a psychiatrist regularly. She had a counselor/therapist in the past but could not afford to keep going. She reports reduced interest in social interaction. She used to be a very social person. 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 She prefers to stay to herself more now.   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 Her mania actually served her well, she was able to stay up 3-4 days in a row and work/talk very quickly opening up accounts. She made a lot of money. She also worked for [employer] so that she could leave jobs when she "crashed" after a manic episode and then start a new one later. She worked many different  jobs but was unable to sustain long term employment with any one job. She has been on disability since age 61 or [age] for bipolar  disorder. Marital history: Married x20 years, no children Alcohol: None Tobacco: Never smoker   Medical History: Past Medical History:  Diagnosis Date  . Bipolar 1 disorder (HCC)   . Constipation   . Depression   . Diabetes mellitus   . Herniated disc   . Hyperlipidemia   . Hypertension   . Personality disorder      Current Medications:  Outpatient Encounter Prescriptions as of 01/21/2017  Medication Sig  . acarbose (PRECOSE) 25 MG tablet TAKE 1 TABLET THREE TIMES DAILY WITH MEALS  . ACCU-CHEK AVIVA PLUS test strip CHECK  BLOOD  SUGARS TWICE DAILY  . AMITIZA 8 MCG capsule TAKE 1 CAPSULE 2 TIMES DAILY WITH A MEAL  . aspirin 81 MG tablet Take 81 mg by mouth daily.  . benztropine (COGENTIN) 0.5 MG tablet Take 0.5 mg by mouth daily.  Marland Kitchen. buPROPion (WELLBUTRIN XL) 300 MG 24 hr tablet Take 300 mg by mouth daily.  . busPIRone (BUSPAR) 5 MG tablet Take 5 mg by mouth 2 (two) times daily.  . Calcium Citrate-Vitamin D (CALCIUM CITRATE + PO) Take by mouth.  . DOCUSATE SODIUM PO Take by mouth.  . donepezil (ARICEPT) 10 MG tablet Take 1 tablet (10 mg total) by mouth at bedtime.  Marland Kitchen. glipiZIDE (GLUCOTROL) 5 MG tablet TAKE 1 TABLET TWICE DAILY BEFORE A MEAL  . lactulose (CHRONULAC) 10 GM/15ML solution Take 15 mLs (10 g total) by mouth daily as needed for mild constipation.  Marland Kitchen. lamoTRIgine (LAMICTAL) 200 MG tablet Take 200 mg by mouth every morning. For mood control  . lisinopril (PRINIVIL,ZESTRIL) 30 MG tablet Take 2 tablets (60 mg total) by mouth daily.  . Multiple Vitamin (MULTIVITAMIN WITH MINERALS) TABS Take 1 tablet by mouth every morning.   . naproxen (NAPROSYN) 500 MG tablet Take 500 mg by mouth 3 (three) times daily as needed. For pain  . rosuvastatin (CRESTOR) 5 MG tablet TAKE 1 TABLET (5 MG TOTAL) BY MOUTH DAILY.  . ziprasidone (GEODON) 80 MG capsule Take 160 mg by mouth at bedtime.    No facility-administered encounter medications on file as of 01/21/2017.      Behavioral  Observations:   Appearance: Neatly and appropriately dressed and groomed Gait: Ambulated independently, no gross abnormalities observed Speech: Fluent; normal rate, rhythm and volume. After answering a question will frequently ask what the question was that prompted the answer she provided. Thought process: Distractible but not tangential; logical Affect: Full, anxious Interpersonal: Very pleasant, appropriate   TESTING: There is medical necessity to proceed with neuropsychological assessment as the results will be used to aid in differential diagnosis and clinical decision-making and to inform specific treatment recommendations. Per the patient and medical records reviewed, there has been a change in cognitive functioning and a reasonable suspicion of neurocognitive disorder (rule out early onset dementia due to history of bipolar disorder; rule out pseudodementia due to major depression).    PLAN: The patient will return for a full battery of neuropsychological testing with a psychometrician under my supervision. Education regarding testing procedures was provided. Subsequently, the patient will see this provider for a follow-up session at which time her test performances and my impressions and treatment recommendations will be reviewed in detail.  Full neuropsychological evaluation report to follow.

## 2017-01-21 ENCOUNTER — Ambulatory Visit (INDEPENDENT_AMBULATORY_CARE_PROVIDER_SITE_OTHER): Payer: Medicare HMO | Admitting: Psychology

## 2017-01-21 ENCOUNTER — Encounter: Payer: Self-pay | Admitting: Psychology

## 2017-01-21 DIAGNOSIS — R413 Other amnesia: Secondary | ICD-10-CM

## 2017-01-21 DIAGNOSIS — F319 Bipolar disorder, unspecified: Secondary | ICD-10-CM

## 2017-01-28 DIAGNOSIS — F314 Bipolar disorder, current episode depressed, severe, without psychotic features: Secondary | ICD-10-CM | POA: Diagnosis not present

## 2017-01-30 ENCOUNTER — Ambulatory Visit (INDEPENDENT_AMBULATORY_CARE_PROVIDER_SITE_OTHER): Payer: Medicare HMO | Admitting: Psychology

## 2017-01-30 DIAGNOSIS — R413 Other amnesia: Secondary | ICD-10-CM

## 2017-01-30 NOTE — Progress Notes (Signed)
   Neuropsychology Note  Diana BoringDonna Watson returned today for 2 hours of neuropsychological testing with technician, Wallace Kellerana Javionna Leder, BS, under the supervision of Dr. Elvis CoilMaryBeth Bailar. The patient did not appear overtly distressed by the testing session, per behavioral observation or via self-report to the technician. Rest breaks were offered. Diana BoringDonna Watson will return within 2 weeks for a feedback session with Dr. Alinda DoomsBailar at which time her test performances, clinical impressions and treatment recommendations will be reviewed in detail. The patient understands she can contact our office should she require our assistance before this time.  Full report to follow.

## 2017-01-31 NOTE — Progress Notes (Signed)
NEUROPSYCHOLOGICAL EVALUATION   Name:    Diana Watson  Date of Birth:   1955/09/11 Date of Interview:  01/21/2017 Date of Testing:  01/30/2017   Date of Feedback:  02/05/2017       Background Information:  Reason for Referral:  Wanza Szumski is a 61 y.o. right handed female referred by Dr. Patrcia Dolly to assess her current level of cognitive functioning and assist in differential diagnosis. The current evaluation consisted of a review of available medical records, an interview with the patient, and the completion of a neuropsychological testing battery. Informed consent was obtained.  History of Presenting Problem:  Mrs. Brockway was seen for initial neurologic consultation of memory loss by Dr. Karel Jarvis on 09/27/2016. MoCA was 29/30. She completed a brain MRI on 12/24/2016 which reportedly revealed no acute abnormalities but noted atrophy and white matter changes moderately advanced for age, possibly representing chronic microvascular ischemic disease given her vascular risk factors.  The patient has no family history of dementia. She has no personal history of head injury or significant alcohol/substance use. She does have a history of bipolar disorder (formally diagnosed at age 42 after years of manic episodes). She reported she had strong cognitive functioning when she was younger, performing in the top 10% of the PSAT. She reports onset of cognitive difficulties about two years ago with progressive worsening over time. She reports the cognitive difficulties are now interfering with quality of life.  She reports reduced reading comprehension, forgetfulness for information she used to know (e.g., Bible scriptures), repeating herself (her husband reportedly notices this), forgetfulness for things she is told, misplacing and losing important items, losing train of thought when speaking, trouble following through on multi step tasks, increased distractibility to external  stimuli, processing information more slowly, word finding difficulty, spelling difficulty, getting lost in the parking lot when looking for her car, and reduced ability to recall driving directions.  She lives with her husband in an apartment. She does not drive (she drove briefly in college but had difficulty with it so never pursued it after that). She manages her medications with a pillbox and denies any difficulty with this. She manages the finances/bills and has more of a tendency to make errors. She does not forget or miss appointments.   She reports chronically depressed mood. She very rarely experiences joy or positive emotion. She has not had any manic episodes for a long time but had these regularly in her 20s and 30s. She was hospitalized 3-4 times for bipolar disorder in the past (due to aggression, uncontrollable spending sprees, and medication noncompliance). She was experiencing suicidal ideation about two years ago and her husband had to hide her medications from her so she would not overdose. She denies any suicidal ideation recently. She denies any sleep difficulty. She takes medication for this. She denies any history of visual or auditory hallucinations. She sees a psychiatrist regularly. She had a counselor/therapist in the past but could not afford to keep going. She reports reduced interest in social interaction. She used to be a very social person. 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 She prefers to stay to herself more now.   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 Her mania actually served her well, she was able to stay up 3-4 days in a  row and work/talk very quickly opening up accounts. She made a lot of money. She also worked for [employer] so that she could leave jobs when she "crashed"  after a manic episode and then start a new one later. She worked many different jobs but was unable to sustain long term employment with any one job. She has been on disability since age 89 or 43 for bipolar disorder. Marital history: Married x20 years, no children Alcohol: None Tobacco: Never smoker   Medical History:  Past Medical History:  Diagnosis Date  . Bipolar 1 disorder (HCC)   . Constipation   . Depression   . Diabetes mellitus   . Herniated disc   . Hyperlipidemia   . Hypertension   . Personality disorder [facility])     Current medications:  Outpatient Encounter Prescriptions as of 02/05/2017  Medication Sig  . acarbose (PRECOSE) 25 MG tablet TAKE 1 TABLET THREE TIMES DAILY WITH MEALS  . ACCU-CHEK AVIVA PLUS test strip CHECK  BLOOD  SUGARS TWICE DAILY  . AMITIZA 8 MCG capsule TAKE 1 CAPSULE 2 TIMES DAILY WITH A MEAL  . aspirin 81 MG tablet Take 81 mg by mouth daily.  . benztropine (COGENTIN) 0.5 MG tablet Take 0.5 mg by mouth daily.  Marland Kitchen buPROPion (WELLBUTRIN XL) 300 MG 24 hr tablet Take 300 mg by mouth daily.  . busPIRone (BUSPAR) 5 MG tablet Take 5 mg by mouth 2 (two) times daily.  . Calcium Citrate-Vitamin D (CALCIUM CITRATE + PO) Take by mouth.  . DOCUSATE SODIUM PO Take by mouth.  . donepezil (ARICEPT) 10 MG tablet Take 1 tablet (10 mg total) by mouth at bedtime.  Marland Kitchen glipiZIDE (GLUCOTROL) 5 MG tablet TAKE 1 TABLET TWICE DAILY BEFORE A MEAL  . lactulose (CHRONULAC) 10 GM/15ML solution Take 15 mLs (10 g total) by mouth daily as needed for mild constipation.  Marland Kitchen lamoTRIgine (LAMICTAL) 200 MG tablet Take 200 mg by mouth every morning. For mood control  . lisinopril (PRINIVIL,ZESTRIL) 30 MG tablet Take 2 tablets (60 mg total) by mouth daily.  . Multiple Vitamin (MULTIVITAMIN WITH MINERALS) TABS Take 1 tablet by mouth every morning.   . naproxen (NAPROSYN) 500 MG tablet Take 500 mg by mouth 3 (three) times daily as needed. For pain  . rosuvastatin (CRESTOR) 5 MG tablet TAKE 1  TABLET (5 MG TOTAL) BY MOUTH DAILY.  . ziprasidone (GEODON) 80 MG capsule Take 160 mg by mouth at bedtime.    No facility-administered encounter medications on file as of 02/05/2017.      Current Examination:  Behavioral Observations:  Appearance: Neatly and appropriately dressed and groomed Gait: Ambulated independently, no gross abnormalities observed Speech: Fluent; normal rate, rhythm and volume. After answering a question will frequently ask what the question was that prompted the answer she provided. Thought process: Distractible but not tangential; logical Affect: Full, anxious Interpersonal: Very pleasant, appropriate Orientation: Oriented to all spheres. Accurately named the current President and his predecessor.   Tests Administered: . Test of Premorbid Functioning (TOPF) . Wechsler Adult Intelligence Scale-Fourth Edition (WAIS-IV): Similarities, Information, Block Design, Matrix Reasoning, Arithmetic, Symbol Search, Coding and Digit Span subtests . Wechsler Memory Scale-Fourth Edition (WMS-IV) Adult Version (ages 52-69): Logical Memory I, II and Recognition subtests  . [place] Verbal Learning Test - 2nd Edition (CVLT-2) Standard Form . [employer] (WCST) . Repeatable Battery for the Assessment of Neuropsychological Status (RBANS) Form A:  Figure Copy and Figure Recall . Controlled Oral Word Association Test (  COWAT) . Trail Making Test A and B . Clock Drawing Test . [place] Diagnostic Aphasia Examination (BDAE): Complex Ideational Material Subtest . Neuropsychological Assessment Battery (NAB) Language Module, Form 1:  Naming and Bill Payment subtests . Beck Depression Inventory - Second edition (BDI-II) . Generalized Anxiety Disorder - 7 item screener (GAD-7)  Test Results: Note: Standardized scores are presented only for use by appropriately trained professionals and to allow for any future test-retest comparison. These scores should not be interpreted  without consideration of all the information that is contained in the rest of the report. The most recent standardization samples from the test publisher or other sources were used whenever possible to derive standard scores; scores were corrected for age, gender, ethnicity and education when available.   Test Scores:  Test Name Raw Score Standardized Score Descriptor  TOPF 45/70 SS= 102 Average  WAIS-IV Subtests     Similarities 21/36 ss= 8 Average  Information 12/26 ss= 9 Average  Block Design 20/66 ss= 6 Low average  Matrix Reasoning 7/26 ss= 5 Borderline  Arithmetic 9/22 ss= 6 Low average  Symbol Search 19/60 ss= 6 Low average  Coding 33/135 ss= 5 Borderline  Digit Span 22/48 ss= 8 Average  WAIS-IV Index Scores     Verbal Comprehension  SS= 93 Average  Perceptual Reasoning  SS= 75 Borderline  Working Memory  SS= 83 Low average  Processing Speed  SS= 76 Borderline  Full Scale IQ (8 subtest)  SS= 77 Borderline  WMS-IV Subtests     LM I 10/50 ss= 3 Impaired  LM II 7/50 ss= 4 Impaired  LM II Recognition 21/30 Cum %: 10-16 Impaired  CVLT-II Scores     Trial 1 3/16 Z= -2 Impaired  Trial 5 9/16 Z= -1.5 Borderline  Trials 1-5 total 31/80 T= 30 Impaired  SD Free Recall 5/16 Z= -2 Impaired  SD Cued Recall 5/16 Z= -3 Severely impaired  LD Free Recall 7/16 Z= -1.5 Borderline  LD Cued Recall 8/16 Z= -1.5 Borderline  Recognition Discriminability 8/16 hits; 2 false positives Z= -2 Impaired  Forced Choice Recognition 16/16  WNL  WCST     Total Errors 29 T= 35 Borderline  Perseverative Responses 22 T= 35 Borderline  Perseverative Errors 18 T= 35 Borderline  Conceptual Level Responses 26 T= 33 Borderline  Categories Completed 2 11-16% Below expectation  Trials to Complete 1st Category 32 6-10% Below expectation  Failure to Maintain Set 0  WNL  RBANS Subtests     Figure Copy 18/20 Z= 0 Average  Figure Recall 0/20 Z= -4.1 Severely impaired  COWAT-FAS 54 T= 58 High average    COWAT-Animals 13 T= 34 Borderline  Trail Making Test A  53" 0 errors T= 33 Borderline  Trail Making Test B  243" 3 errors T= -19 Severely impaired  Clock Drawing Test   Impaired  BDAE Complex Ideational Material 10/12  Below expectation  NAB Language subtests     Naming 31/31 T= 55 Average  Bill Payment 14/19 T= 19 Severely impaired  BDI-II 23/63  Moderate  GAD-7 15/21  Severe     Description of Test Results:  For the most part, embedded performance validity indicators were within normal limits. She did demonstrate some variable attention on one embedded performance validity indicator, but overall the patient's current performance on neurocognitive testing is judged to be a relatively accurate representation of her current level of neurocognitive functioning.   Premorbid verbal intellectual abilities were estimated to have been within the  average range based on a test of word reading. Consistent with this, current verbal intellectual abilities on the WAIS-IV fell within the average range. However, her full scale IQ (comprised of four indices including verbal IQ, perceptual IQ, working memory and processing speed) fell within the borderline range.  Psychomotor processing speed was borderline impaired. Auditory attention and working memory were low average. Visual-spatial construction was low average to average. Language abilities were variable. Specifically, confrontation naming was average, and semantic verbal fluency was borderline. Auditory comprehension of complex ideational material was mildly impaired. On a simulated bill payment task requiring multiple aspects of expressive and receptive language, she performed in the severely impaired range. . With regard to verbal memory, encoding and acquisition of non-contextual information (i.e., word list) was impaired across five learning trials. After a brief interference task, free recall was impaired (5/16 items). She did not benefit from  semantic cueing to recall additional items (severely impaired). After a delay, free recall was better (7/16 items), falling within the borderline impaired range. Cued recall was borderline impaired as well (8/16 items). Performance on a yes/no recognition task was severely impaired due to very low recognition of target items. There was evidence of mild inattention on this task (e.g., did not endorse one of the target items despite freely recalling it on a trial immediately prior). Performance on a forced-choice recognition task was intact. On another verbal memory test, encoding and acquisition of contextual auditory information (i.e., short stories) was impaired. After a delay, free recall was impaired. Performance on a yes/no recognition task was impaired. With regard to non-verbal memory, delayed free recall of visual information was severely impaired (no features of the original figure were recalled). Executive functioning was variable. Mental flexibility and set-shifting were severely impaired on Trails B; she required significantly increased time to complete the task and committed three set loss errors. Verbal fluency with phonemic search restrictions was high average. Verbal abstract reasoning was average. Non-verbal abstract reasoning was borderline. Deductive reasoning was borderline/below expectation.   On a self-report measure of mood, the patient's responses were indicative of clinically significant depression at the present time. She denied suicidal ideation or intention. On a self-report measure of anxiety, the patient endorsed severe generalized anxiety at the present time.    Clinical Impressions: Major neurocognitive disorder, most likely due to longstanding bipolar disorder and/or cerebrovascular disease. Relative to estimated premorbid intellectual abilities in the average range, Ms. Valdes-Jeffers' current cognitive functioning demonstrates prominent declines in processing speed, memory  encoding, and executive functioning. She demonstrates relatively good recall of information that is successfully encoded, but the amount of information encoded is significantly reduced. The patient's cognitive profile is consistent with mild to moderate frontal-subcortical dysfunction, and is consistent with what is commonly seen and reported in the literature for individuals with bipolar disorder. However, frontal-subcortical dysfunction is also commonly seen in individuals with vascular risk factors and cerebrovascular disease, so this could also be a major contributing factor. She is also endorsing a high level of depression and anxiety at the present time, which can exacerbate cognitive dysfunction. Overall, the level of cognitive dysfunction demonstrated on testing is consistent with a diagnosis of major neurocognitive disorder, which is most likely due to neuropathological changes associated with bipolar disorder and/or cerebrovascular disease.   Recommendations/Plan: Based on the findings of the present evaluation, the following recommendations are offered:  1. Continue mental health treatment (medication compliance, regular psychiatry follow-up) for bipolar disorder, depressive episode, and anxiety. 2. Optimal control of vascular risk  factors, including regular cardiovascular exercise. 3. Regular schedule and routine will help maximize cognitive functioning. 4. Assistance with or at least monitoring of complex ADLs by her husband should be continued. 5. Consider neurocognitive re-evaluation in 1-2 years especially if she is reporting worsening cognitive function.   Feedback to Patient: Moise BoringDonna Valdes-Jeffers returned for a feedback appointment on 02/05/2017 to review the results of her neuropsychological evaluation with this provider. 20 minutes face-to-face time was spent reviewing her test results, my impressions and my recommendations as detailed above.    Total time spent on this patient's  case: 90791x1 unit for interview with psychologist; [phone] units of testing by psychometrician under psychologist's supervision; [phone] units for medical record review, scoring of neuropsychological tests, interpretation of test results, preparation of this report, and review of results to the patient by psychologist.      Thank you for your referral of Moise BoringDonna Valdes-Jeffers. Please feel free to contact me if you have any questions or concerns regarding this report.

## 2017-02-02 ENCOUNTER — Other Ambulatory Visit: Payer: Self-pay | Admitting: Internal Medicine

## 2017-02-05 ENCOUNTER — Ambulatory Visit: Payer: Self-pay | Admitting: Neurology

## 2017-02-05 ENCOUNTER — Ambulatory Visit: Payer: Medicare HMO | Admitting: Psychology

## 2017-02-05 ENCOUNTER — Encounter: Payer: Self-pay | Admitting: Psychology

## 2017-02-05 DIAGNOSIS — R413 Other amnesia: Secondary | ICD-10-CM | POA: Diagnosis not present

## 2017-02-14 ENCOUNTER — Telehealth: Payer: Self-pay | Admitting: Neurology

## 2017-02-14 NOTE — Telephone Encounter (Signed)
Patient left message on voicemail that wants to know if she really needs to come in on 02-25-17. She already knows the results of the test that [facility] did she states please call

## 2017-02-25 ENCOUNTER — Encounter: Payer: Self-pay | Admitting: Psychology

## 2017-02-25 ENCOUNTER — Ambulatory Visit: Payer: Self-pay | Admitting: Neurology

## 2017-03-11 ENCOUNTER — Other Ambulatory Visit: Payer: Self-pay | Admitting: Internal Medicine

## 2017-04-15 ENCOUNTER — Encounter: Payer: Self-pay | Admitting: Internal Medicine

## 2017-04-15 ENCOUNTER — Ambulatory Visit (INDEPENDENT_AMBULATORY_CARE_PROVIDER_SITE_OTHER): Payer: Medicare HMO | Admitting: Internal Medicine

## 2017-04-15 ENCOUNTER — Other Ambulatory Visit (INDEPENDENT_AMBULATORY_CARE_PROVIDER_SITE_OTHER): Payer: Medicare HMO

## 2017-04-15 VITALS — BP 140/80 | HR 47 | Temp 98.0°F | Ht 65.0 in | Wt 179.0 lb

## 2017-04-15 DIAGNOSIS — F0150 Vascular dementia without behavioral disturbance: Secondary | ICD-10-CM

## 2017-04-15 DIAGNOSIS — I10 Essential (primary) hypertension: Secondary | ICD-10-CM

## 2017-04-15 DIAGNOSIS — K5900 Constipation, unspecified: Secondary | ICD-10-CM | POA: Diagnosis not present

## 2017-04-15 DIAGNOSIS — F0390 Unspecified dementia without behavioral disturbance: Secondary | ICD-10-CM

## 2017-04-15 DIAGNOSIS — E1169 Type 2 diabetes mellitus with other specified complication: Secondary | ICD-10-CM | POA: Diagnosis not present

## 2017-04-15 DIAGNOSIS — E785 Hyperlipidemia, unspecified: Secondary | ICD-10-CM

## 2017-04-15 DIAGNOSIS — E119 Type 2 diabetes mellitus without complications: Secondary | ICD-10-CM | POA: Diagnosis not present

## 2017-04-15 DIAGNOSIS — Z0000 Encounter for general adult medical examination without abnormal findings: Secondary | ICD-10-CM

## 2017-04-15 LAB — COMPREHENSIVE METABOLIC PANEL
ALK PHOS: 75 U/L (ref 39–117)
ALT: 42 U/L — ABNORMAL HIGH (ref 0–35)
AST: 28 U/L (ref 0–37)
Albumin: 4.6 g/dL (ref 3.5–5.2)
BUN: 19 mg/dL (ref 6–23)
CO2: 31 meq/L (ref 19–32)
Calcium: 9.6 mg/dL (ref 8.4–10.5)
Chloride: 102 mEq/L (ref 96–112)
Creatinine, Ser: 1.01 mg/dL (ref 0.40–1.20)
GFR: 71.94 mL/min (ref >60.00)
GLUCOSE: 170 mg/dL — AB (ref 70–99)
POTASSIUM: 4.6 meq/L (ref 3.5–5.1)
SODIUM: 140 meq/L (ref 135–145)
TOTAL PROTEIN: 7.9 g/dL (ref 6.0–8.3)
Total Bilirubin: 0.2 mg/dL (ref 0.2–1.2)

## 2017-04-15 LAB — HEMOGLOBIN A1C: HEMOGLOBIN A1C: 6.6 % — AB (ref 4.6–6.5)

## 2017-04-15 LAB — LIPID PANEL
CHOL/HDL RATIO: 2
Cholesterol: 121 mg/dL (ref 0–200)
HDL: 55 mg/dL (ref >39.00)
LDL Cholesterol: 50 mg/dL (ref 0–99)
NONHDL: 66.31
Triglycerides: 81 mg/dL (ref 0.0–149.0)
VLDL: 16.2 mg/dL (ref 0.0–40.0)

## 2017-04-15 MED ORDER — ACARBOSE 25 MG PO TABS: ORAL_TABLET | ORAL | 3 refills | Status: DC

## 2017-04-15 MED ORDER — ROSUVASTATIN CALCIUM 5 MG PO TABS: 5.0000 mg | ORAL_TABLET | Freq: Every day | ORAL | 3 refills | Status: DC

## 2017-04-15 NOTE — Assessment & Plan Note (Signed)
Checking labs today, mammogram and pap smear up to date with gyn. Colonoscopy up to date. Flu and tetanus up to date and pneumonia. She decline hiv screening. Foot exam done. Counseled about sun safety and memory strategies to help. Given 10 year screening recommendations.

## 2017-04-15 NOTE — Progress Notes (Signed)
   Subjective:    Patient ID: Diana Watson, female    DOB: 11/27/1955, 62 y.o.   MRN: [id number]  HPI The patient is a 62 YO female coming in for physical.   [facility], social history reviewed and updated.   Review of Systems  Constitutional: Negative.   HENT: Negative.   Eyes: Negative.   Respiratory: Negative for cough, chest tightness and shortness of breath.   Cardiovascular: Negative for chest pain, palpitations and leg swelling.  Gastrointestinal: Positive for constipation. Negative for abdominal distention, abdominal pain, diarrhea, nausea and vomiting.  Musculoskeletal: Negative.   Skin: Negative.   Neurological: Negative.   Psychiatric/Behavioral: Negative.        Memory changes      Objective:   Physical Exam  Constitutional: She is oriented to person, place, and time. She appears well-developed and well-nourished.  HENT:  Head: Normocephalic and atraumatic.  Eyes: EOM are normal.  Neck: Normal range of motion.  Cardiovascular: Normal rate and regular rhythm.  Pulmonary/Chest: Effort normal and breath sounds normal. No respiratory distress. She has no wheezes. She has no rales.  Abdominal: Soft. Bowel sounds are normal. She exhibits no distension. There is no tenderness. There is no rebound.  Musculoskeletal: She exhibits no edema.  Neurological: She is alert and oriented to person, place, and time. Coordination normal.  Skin: Skin is warm and dry.  Foot exam done  Psychiatric: She has a normal mood and affect.   Vitals:   04/15/17 0910  BP: 140/80  Pulse: (!) 47  Temp: 98 F (36.7 C)  TempSrc: Oral  SpO2: 99%  Weight: 179 lb (81.2 kg)  Height: 5\' 5"  (1.651 m)      Assessment & Plan:

## 2017-04-15 NOTE — Assessment & Plan Note (Signed)
BP at goal on lisinopril. Checking CMP and adjust as needed.  

## 2017-04-15 NOTE — Assessment & Plan Note (Signed)
Neurocognitive testing did reveal changes from her mental health and medications. She is not willing to adjust medications now as she is stable. She does require assistance with IADLs at home and using reminders for most things.

## 2017-04-15 NOTE — Patient Instructions (Signed)
We will check the labs today.  Do not take glipizide more than twice per day even if you eat more. It is not safe and can cause low blood sugars.   Health Maintenance, Female Adopting a healthy lifestyle and getting preventive care can go a long way to promote health and wellness. Talk with your health care provider about what schedule of regular examinations is right for you. This is a good chance for you to check in with your provider about disease prevention and staying healthy. In between checkups, there are plenty of things you can do on your own. Experts have done a lot of research about which lifestyle changes and preventive measures are most likely to keep you healthy. Ask your health care provider for more information. Weight and diet Eat a healthy diet  Be sure to include plenty of vegetables, fruits, low-fat dairy products, and lean protein.  Do not eat a lot of foods high in solid fats, added sugars, or salt.  Get regular exercise. This is one of the most important things you can do for your health. ? Most adults should exercise for at least 150 minutes each week. The exercise should increase your heart rate and make you sweat (moderate-intensity exercise). ? Most adults should also do strengthening exercises at least twice a week. This is in addition to the moderate-intensity exercise.  Maintain a healthy weight  Body mass index (BMI) is a measurement that can be used to identify possible weight problems. It estimates body fat based on height and weight. Your health care provider can help determine your BMI and help you achieve or maintain a healthy weight.  For females 30 years of age and older: ? A BMI below 18.5 is considered underweight. ? A BMI of 18.5 to 24.9 is normal. ? A BMI of 25 to 29.9 is considered overweight. ? A BMI of 30 and above is considered obese.  Watch levels of cholesterol and blood lipids  You should start having your blood tested for lipids and  cholesterol at 60 years of age, then have this test every 5 years.  You may need to have your cholesterol levels checked more often if: ? Your lipid or cholesterol levels are high. ? You are older than 60 years of age. ? You are at high risk for heart disease.  Cancer screening Lung Cancer  Lung cancer screening is recommended for adults 72-44 years old who are at high risk for lung cancer because of a history of smoking.  A yearly low-dose CT scan of the lungs is recommended for people who: ? Currently smoke. ? Have quit within the past 15 years. ? Have at least a 30-pack-year history of smoking. A pack year is smoking an average of one pack of cigarettes a day for 1 year.  Yearly screening should continue until it has been 15 years since you quit.  Yearly screening should stop if you develop a health problem that would prevent you from having lung cancer treatment.  Breast Cancer  Practice breast self-awareness. This means understanding how your breasts normally appear and feel.  It also means doing regular breast self-exams. Let your health care provider know about any changes, no matter how small.  If you are in your 20s or 30s, you should have a clinical breast exam (CBE) by a health care provider every 1-3 years as part of a regular health exam.  If you are 29 or older, have a CBE every year. Also consider  having a breast X-ray (mammogram) every year.  If you have a family history of breast cancer, talk to your health care provider about genetic screening.  If you are at high risk for breast cancer, talk to your health care provider about having an MRI and a mammogram every year.  Breast cancer gene (BRCA) assessment is recommended for women who have family members with BRCA-related cancers. BRCA-related cancers include: ? Breast. ? Ovarian. ? Tubal. ? Peritoneal cancers.  Results of the assessment will determine the need for genetic counseling and BRCA1 and BRCA2  testing.  Cervical Cancer Your health care provider may recommend that you be screened regularly for cancer of the pelvic organs (ovaries, uterus, and vagina). This screening involves a pelvic examination, including checking for microscopic changes to the surface of your cervix (Pap test). You may be encouraged to have this screening done every 3 years, beginning at age 78.  For women ages 58-65, health care providers may recommend pelvic exams and Pap testing every 3 years, or they may recommend the Pap and pelvic exam, combined with testing for human papilloma virus (HPV), every 5 years. Some types of HPV increase your risk of cervical cancer. Testing for HPV may also be done on women of any age with unclear Pap test results.  Other health care providers may not recommend any screening for nonpregnant women who are considered low risk for pelvic cancer and who do not have symptoms. Ask your health care provider if a screening pelvic exam is right for you.  If you have had past treatment for cervical cancer or a condition that could lead to cancer, you need Pap tests and screening for cancer for at least 20 years after your treatment. If Pap tests have been discontinued, your risk factors (such as having a new sexual partner) need to be reassessed to determine if screening should resume. Some women have medical problems that increase the chance of getting cervical cancer. In these cases, your health care provider may recommend more frequent screening and Pap tests.  Colorectal Cancer  This type of cancer can be detected and often prevented.  Routine colorectal cancer screening usually begins at 60 years of age and continues through 60 years of age.  Your health care provider may recommend screening at an earlier age if you have risk factors for colon cancer.  Your health care provider may also recommend using home test kits to check for hidden blood in the stool.  A small camera at the end of a  tube can be used to examine your colon directly (sigmoidoscopy or colonoscopy). This is done to check for the earliest forms of colorectal cancer.  Routine screening usually begins at age 25.  Direct examination of the colon should be repeated every 5-10 years through 60 years of age. However, you may need to be screened more often if early forms of precancerous polyps or small growths are found.  Skin Cancer  Check your skin from head to toe regularly.  Tell your health care provider about any new moles or changes in moles, especially if there is a change in a mole's shape or color.  Also tell your health care provider if you have a mole that is larger than the size of a pencil eraser.  Always use sunscreen. Apply sunscreen liberally and repeatedly throughout the day.  Protect yourself by wearing long sleeves, pants, a wide-brimmed hat, and sunglasses whenever you are outside.  Heart disease, diabetes, and high blood pressure  High blood pressure causes heart disease and increases the risk of stroke. High blood pressure is more likely to develop in: ? People who have blood pressure in the high end of the normal range (130-139/85-89 mm Hg). ? People who are overweight or obese. ? People who are African American.  If you are 97-43 years of age, have your blood pressure checked every 3-5 years. If you are 34 years of age or older, have your blood pressure checked every year. You should have your blood pressure measured twice-once when you are at a hospital or clinic, and once when you are not at a hospital or clinic. Record the average of the two measurements. To check your blood pressure when you are not at a hospital or clinic, you can use: ? An automated blood pressure machine at a pharmacy. ? A home blood pressure monitor.  If you are between 39 years and 85 years old, ask your health care provider if you should take aspirin to prevent strokes.  Have regular diabetes screenings. This  involves taking a blood sample to check your fasting blood sugar level. ? If you are at a normal weight and have a low risk for diabetes, have this test once every three years after 60 years of age. ? If you are overweight and have a high risk for diabetes, consider being tested at a younger age or more often. Preventing infection Hepatitis B  If you have a higher risk for hepatitis B, you should be screened for this virus. You are considered at high risk for hepatitis B if: ? You were born in a country where hepatitis B is common. Ask your health care provider which countries are considered high risk. ? Your parents were born in a high-risk country, and you have not been immunized against hepatitis B (hepatitis B vaccine). ? You have HIV or AIDS. ? You use needles to inject street drugs. ? You live with someone who has hepatitis B. ? You have had sex with someone who has hepatitis B. ? You get hemodialysis treatment. ? You take certain medicines for conditions, including cancer, organ transplantation, and autoimmune conditions.  Hepatitis C  Blood testing is recommended for: ? Everyone born from [birth year] through 1963. ? Anyone with known risk factors for hepatitis C.  Sexually transmitted infections (STIs)  You should be screened for sexually transmitted infections (STIs) including gonorrhea and chlamydia if: ? You are sexually active and are younger than 60 years of age. ? You are older than 60 years of age and your health care provider tells you that you are at risk for this type of infection. ? Your sexual activity has changed since you were last screened and you are at an increased risk for chlamydia or gonorrhea. Ask your health care provider if you are at risk.  If you do not have HIV, but are at risk, it may be recommended that you take a prescription medicine daily to prevent HIV infection. This is called pre-exposure prophylaxis (PrEP). You are considered at risk if: ? You are  sexually active and do not regularly use condoms or know the HIV status of your partner(s). ? You take drugs by injection. ? You are sexually active with a partner who has HIV.  Talk with your health care provider about whether you are at high risk of being infected with HIV. If you choose to begin PrEP, you should first be tested for HIV. You should then be tested every 3 months  for as long as you are taking PrEP. Pregnancy  If you are premenopausal and you may become pregnant, ask your health care provider about preconception counseling.  If you may become pregnant, take 400 to 800 micrograms (mcg) of folic acid every day.  If you want to prevent pregnancy, talk to your health care provider about birth control (contraception). Osteoporosis and menopause  Osteoporosis is a disease in which the bones lose minerals and strength with aging. This can result in serious bone fractures. Your risk for osteoporosis can be identified using a bone density scan.  If you are 29 years of age or older, or if you are at risk for osteoporosis and fractures, ask your health care provider if you should be screened.  Ask your health care provider whether you should take a calcium or vitamin D supplement to lower your risk for osteoporosis.  Menopause may have certain physical symptoms and risks.  Hormone replacement therapy may reduce some of these symptoms and risks. Talk to your health care provider about whether hormone replacement therapy is right for you. Follow these instructions at home:  Schedule regular health, dental, and eye exams.  Stay current with your immunizations.  Do not use any tobacco products including cigarettes, chewing tobacco, or electronic cigarettes.  If you are pregnant, do not drink alcohol.  If you are breastfeeding, limit how much and how often you drink alcohol.  Limit alcohol intake to no more than 1 drink per day for nonpregnant women. One drink equals 12 ounces of  beer, 5 ounces of wine, or 1 ounces of hard liquor.  Do not use street drugs.  Do not share needles.  Ask your health care provider for help if you need support or information about quitting drugs.  Tell your health care provider if you often feel depressed.  Tell your health care provider if you have ever been abused or do not feel safe at home. This information is not intended to replace advice given to you by your health care provider. Make sure you discuss any questions you have with your health care provider. Document Released: 10/02/2010 Document Revised: 08/25/2015 Document Reviewed: 12/21/2014 Elsevier Interactive Patient Education  [employer].

## 2017-04-15 NOTE — Assessment & Plan Note (Signed)
Checking lipid panel today and adjust crestor 5 mg daily as needed.

## 2017-04-15 NOTE — Assessment & Plan Note (Signed)
Stable with lactulose several times per week.

## 2017-04-15 NOTE — Assessment & Plan Note (Signed)
Taking glipizide and acarbose and has been overtaking glipizide (TID instead of BID) and have asked to take only BID even with increased eating. Her last HgA1c 6.1. Checking HgA1c today and foot exam done. Eye exam up to date. Adjust as needed.

## 2017-04-18 DIAGNOSIS — F314 Bipolar disorder, current episode depressed, severe, without psychotic features: Secondary | ICD-10-CM | POA: Diagnosis not present

## 2017-05-14 ENCOUNTER — Other Ambulatory Visit: Payer: Self-pay | Admitting: Internal Medicine

## 2017-06-25 ENCOUNTER — Other Ambulatory Visit: Payer: Self-pay

## 2017-06-25 MED ORDER — ACARBOSE 25 MG PO TABS: ORAL_TABLET | ORAL | 3 refills | Status: DC

## 2017-06-25 MED ORDER — ROSUVASTATIN CALCIUM 5 MG PO TABS: 5.0000 mg | ORAL_TABLET | Freq: Every day | ORAL | 3 refills | Status: DC

## 2017-07-11 DIAGNOSIS — F314 Bipolar disorder, current episode depressed, severe, without psychotic features: Secondary | ICD-10-CM | POA: Diagnosis not present

## 2017-07-16 ENCOUNTER — Other Ambulatory Visit: Payer: Self-pay | Admitting: Internal Medicine

## 2017-07-16 DIAGNOSIS — Z139 Encounter for screening, unspecified: Secondary | ICD-10-CM

## 2017-07-24 ENCOUNTER — Ambulatory Visit (INDEPENDENT_AMBULATORY_CARE_PROVIDER_SITE_OTHER): Payer: Medicare HMO | Admitting: Internal Medicine

## 2017-07-24 ENCOUNTER — Encounter: Payer: Self-pay | Admitting: Internal Medicine

## 2017-07-24 ENCOUNTER — Other Ambulatory Visit (INDEPENDENT_AMBULATORY_CARE_PROVIDER_SITE_OTHER): Payer: Self-pay

## 2017-07-24 VITALS — BP 120/80 | HR 71 | Temp 97.8°F | Ht 65.0 in | Wt 168.1 lb

## 2017-07-24 DIAGNOSIS — K5900 Constipation, unspecified: Secondary | ICD-10-CM | POA: Diagnosis not present

## 2017-07-24 DIAGNOSIS — E119 Type 2 diabetes mellitus without complications: Secondary | ICD-10-CM

## 2017-07-24 LAB — HEMOGLOBIN A1C: HEMOGLOBIN A1C: 6.2 % (ref 4.6–6.5)

## 2017-07-24 NOTE — Progress Notes (Signed)
   Subjective:    Patient ID: Diana Watson, female    DOB: 01/05/1956, 62 y.o.   MRN: [id number]  HPI The patient is a 62 YO female coming in for follow up of her diabetes type 2. She is taking her glipizide the same. She is worried as her last HgA1c was mildly up from before and she likes it to be well controlled. Denies low sugars. Average is around 90-100 in the morning. She is exercising more now that the weather is better. Denies numbness or tingling in her feet or hands. Denies other changes in meds.   Review of Systems  Constitutional: Negative.   HENT: Negative.   Eyes: Negative.   Respiratory: Negative for cough, chest tightness and shortness of breath.   Cardiovascular: Negative for chest pain, palpitations and leg swelling.  Gastrointestinal: Negative for abdominal distention, abdominal pain, constipation, diarrhea, nausea and vomiting.  Musculoskeletal: Negative.   Skin: Negative.   Neurological: Negative.   Psychiatric/Behavioral: Negative.       Objective:   Physical Exam  Constitutional: She is oriented to person, place, and time. She appears well-developed and well-nourished.  HENT:  Head: Normocephalic and atraumatic.  Eyes: EOM are normal.  Neck: Normal range of motion.  Cardiovascular: Normal rate and regular rhythm.  Pulmonary/Chest: Effort normal and breath sounds normal. No respiratory distress. She has no wheezes. She has no rales.  Abdominal: Soft. Bowel sounds are normal. She exhibits no distension. There is no tenderness. There is no rebound.  Musculoskeletal: She exhibits no edema.  Neurological: She is alert and oriented to person, place, and time. Coordination normal.  Skin: Skin is warm and dry.   Vitals:   07/24/17 0926  BP: 120/80  Pulse: 71  Temp: 97.8 F (36.6 C)  TempSrc: Oral  SpO2: 98%  Weight: 168 lb 1.3 oz (76.2 kg)  Height: 5\' 5"  (1.651 m)      Assessment & Plan:

## 2017-07-24 NOTE — Patient Instructions (Addendum)
We have given you the samples of linzess to take 1 pill daily. If this works let us know and we will send in a prescription.   If you get diarrhea there is a lower dose we can try. If it does not work enough there is also a higher dose we can try.   We are checking the labs today and will call you back with the results.

## 2017-07-26 NOTE — Assessment & Plan Note (Signed)
Given samples of linzess and if she has success can prescribe to take long term.

## 2017-07-26 NOTE — Assessment & Plan Note (Signed)
Checking HgA1c, reminded her that her previous HgA1c of 6.6 is very good. She does like when her HgA1c is lower. Foot exam up to date and eye exam up to date. Adjust glipizide as needed.

## 2017-08-09 DIAGNOSIS — H2513 Age-related nuclear cataract, bilateral: Secondary | ICD-10-CM | POA: Diagnosis not present

## 2017-08-09 DIAGNOSIS — H5203 Hypermetropia, bilateral: Secondary | ICD-10-CM | POA: Diagnosis not present

## 2017-08-09 DIAGNOSIS — H524 Presbyopia: Secondary | ICD-10-CM | POA: Diagnosis not present

## 2017-08-09 DIAGNOSIS — Z7984 Long term (current) use of oral hypoglycemic drugs: Secondary | ICD-10-CM | POA: Diagnosis not present

## 2017-08-09 DIAGNOSIS — H52203 Unspecified astigmatism, bilateral: Secondary | ICD-10-CM | POA: Diagnosis not present

## 2017-08-09 DIAGNOSIS — E119 Type 2 diabetes mellitus without complications: Secondary | ICD-10-CM | POA: Diagnosis not present

## 2017-08-09 DIAGNOSIS — H25013 Cortical age-related cataract, bilateral: Secondary | ICD-10-CM | POA: Diagnosis not present

## 2017-08-09 LAB — HM DIABETES EYE EXAM

## 2017-08-14 ENCOUNTER — Encounter: Payer: Self-pay | Admitting: Internal Medicine

## 2017-08-14 ENCOUNTER — Telehealth: Payer: Self-pay | Admitting: Internal Medicine

## 2017-08-14 NOTE — Progress Notes (Signed)
Abstracted and sent to scan  

## 2017-08-14 NOTE — Telephone Encounter (Signed)
Copied from CRM [id number]. Topic: Quick Communication - See Telephone Encounter >> Aug 14, 2017 12:47 PM Lorrine Kin, NT wrote: CRM for notification. See Telephone encounter for: 08/14/17. Patient calling and states that she was given some samples of Linzess at her last visit with Dr Okey Dupre. States those pills are not working. Would like to get a prescription called in for Linzess to [facility] - [place], [place] - [address] Please advise. CB#: [phone]

## 2017-08-15 MED ORDER — LINACLOTIDE 290 MCG PO CAPS: 290.0000 ug | ORAL_CAPSULE | Freq: Every day | ORAL | 5 refills | Status: DC

## 2017-08-15 MED ORDER — LINACLOTIDE 290 MCG PO CAPS: 290.0000 ug | ORAL_CAPSULE | Freq: Every day | ORAL | 1 refills | Status: DC

## 2017-08-15 NOTE — Telephone Encounter (Signed)
Sent in to mail order and also by accident to local so advise patient to call them and ask not to fill.

## 2017-08-15 NOTE — Telephone Encounter (Signed)
LVM informing patient of MD response  

## 2017-08-23 ENCOUNTER — Ambulatory Visit (INDEPENDENT_AMBULATORY_CARE_PROVIDER_SITE_OTHER): Payer: Medicare HMO | Admitting: *Deleted

## 2017-08-23 VITALS — BP 120/72 | HR 70 | Resp 18 | Ht 65.0 in | Wt 159.0 lb

## 2017-08-23 DIAGNOSIS — Z0000 Encounter for general adult medical examination without abnormal findings: Secondary | ICD-10-CM | POA: Diagnosis not present

## 2017-08-23 NOTE — Progress Notes (Signed)
Subjective:   Diana Watson is a 62 y.o. female who presents for an Initial Medicare Annual Wellness Visit.  Review of Systems    No ROS.  Medicare Wellness Visit. Additional risk factors are reflected in the social history.   Cardiac Risk Factors include: advanced age (>76men, >92 women);diabetes mellitus;dyslipidemia Sleep patterns: feels rested on waking, gets up 1 times nightly to void and sleeps 9 hours nightly.    Home Safety/Smoke Alarms: Feels safe in home. Smoke alarms in place.  Living environment; residence and Firearm Safety: 1-story house/ trailer, no firearms. Lives with husband, no needs for DME, good support system Seat Belt Safety/Bike Helmet: Wears seat belt.     Objective:    Today's Vitals   08/23/17 1033  BP: 120/72  Pulse: 70  Resp: 18  SpO2: 100%  Weight: 159 lb (72.1 kg)  Height:  (1.651 m)   Body mass index is 26.46 kg/m.  Advanced Directives 08/23/2017 05/23/2016 06/09/2014 04/29/2011  Does Patient Have a Medical Advance Directive? No No No Patient does not have advance directive  Would patient like information on creating a medical advance directive? Yes (ED - Information included in AVS) - No - patient declined information -  Pre-existing out of facility DNR order (yellow form or pink MOST form) - - - No  Some encounter information is confidential and restricted. Go to Review Flowsheets activity to see all data.    Current Medications (verified) Outpatient Encounter Medications as of 08/23/2017  Medication Sig  . acarbose (PRECOSE) 25 MG tablet TAKE 1 TABLET THREE TIMES DAILY WITH MEALS  . ACCU-CHEK AVIVA PLUS test strip CHECK  BLOOD  SUGARS TWICE DAILY  . aspirin 81 MG tablet Take 81 mg by mouth daily.  . benztropine (COGENTIN) 0.5 MG tablet Take 0.5 mg by mouth daily.  Marland Kitchen buPROPion (WELLBUTRIN XL) 300 MG 24 hr tablet Take 300 mg by mouth daily.  . busPIRone (BUSPAR) 5 MG tablet Take 5 mg by mouth 2 (two) times daily.  . Calcium  Citrate-Vitamin D (CALCIUM CITRATE + PO) Take by mouth.  . DOCUSATE SODIUM PO Take by mouth.  Marland Kitchen glipiZIDE (GLUCOTROL) 5 MG tablet TAKE 1 TABLET TWICE DAILY BEFORE MEALS  . lactulose (CHRONULAC) 10 GM/15ML solution TAKE 15 ML DAILY AS NEEDED FOR MILD CONSTIPATION. (Patient taking differently: TAKE 15 ML DAILY AS NEEDED FOR MILD CONSTIPATION. taking 6TBS)  . lamoTRIgine (LAMICTAL) 200 MG tablet Take 200 mg by mouth every morning. For mood control  . linaclotide (LINZESS) 290 MCG CAPS capsule Take 1 capsule (290 mcg total) by mouth daily before breakfast.  . lisinopril (PRINIVIL,ZESTRIL) 30 MG tablet TAKE 2 TABLETS EVERY DAY  . Multiple Vitamin (MULTIVITAMIN WITH MINERALS) TABS Take 1 tablet by mouth every morning.   . naproxen (NAPROSYN) 500 MG tablet Take 500 mg by mouth 3 (three) times daily as needed. For pain  . rosuvastatin (CRESTOR) 5 MG tablet Take 1 tablet (5 mg total) by mouth daily.  . ziprasidone (GEODON) 80 MG capsule Take 160 mg by mouth at bedtime.    No facility-administered encounter medications on file as of 08/23/2017.     Allergies (verified) Prednisone and Lactose intolerance (gi)   History: Past Medical History:  Diagnosis Date  . Bipolar 1 disorder (HCC)   . Constipation   . Depression   . Diabetes mellitus   . Herniated disc   . Hyperlipidemia   . Hypertension   . Neurocognitive disorder   . Personality disorder (HCC)  Past Surgical History:  Procedure Laterality Date  . DILATION AND CURETTAGE OF UTERUS    . RIGHT OOPHORECTOMY     Also removed a tumor at that time.   Family History  Problem Relation Age of Onset  . Diabetes type II Mother   . Hypertension Mother   . OCD Mother   . Bipolar disorder Mother   . Diabetes type II Father   . Hypertension Father   . Bipolar disorder Cousin   . Colon cancer Neg Hx    Social History   Socioeconomic History  . Marital status: Married    Spouse name: Not on file  . Number of children: Not on file  .  Years of education: Not on file  . Highest education level: Not on file  Occupational History  . Not on file  Social Needs  . Financial resource strain: Not very hard  . Food insecurity:    Worry: Never true    Inability: Never true  . Transportation needs:    Medical: No    Non-medical: No  Tobacco Use  . Smoking status: Never Smoker  . Smokeless tobacco: Never Used  Substance and Sexual Activity  . Alcohol use: No    Alcohol/week: 0.0 oz    Types: 6 - 12 Standard drinks or equivalent per week  . Drug use: No  . Sexual activity: Yes    Birth control/protection: None  Lifestyle  . Physical activity:    Days per week: 4 days    Minutes per session: 60 min  . Stress: To some extent  Relationships  . Social connections:    Talks on phone: More than three times a week    Gets together: More than three times a week    Attends religious service: More than 4 times per year    Active member of club or organization: Yes    Attends meetings of clubs or organizations: More than 4 times per year    Relationship status: Married  Other Topics Concern  . Not on file  Social History Narrative  . Not on file    Tobacco Counseling Counseling given: Not Answered  Activities of Daily Living In your present state of health, do you have any difficulty performing the following activities: 08/23/2017  Hearing? N  Vision? N  Difficulty concentrating or making decisions? N  Walking or climbing stairs? N  Dressing or bathing? N  Doing errands, shopping? N  Preparing Food and eating ? N  Using the Toilet? N  In the past six months, have you accidently leaked urine? N  Do you have problems with loss of bowel control? N  Managing your Medications? N  Managing your Finances? N  Housekeeping or managing your Housekeeping? N  Some recent data might be hidden     Immunizations and Health Maintenance Immunization History  Administered Date(s) Administered  . Influenza-Unspecified  12/21/2015, 01/14/2017  . Pneumococcal Polysaccharide-23 01/05/2016  . Tdap 01/06/2012  . Zoster 10/12/2015   Health Maintenance Due  Topic Date Due  . HIV Screening  06/01/1972  . PAP SMEAR  04/16/2017    Patient Care Team: Myrlene Broker, MD as PCP - General (Internal Medicine) Readling, Curlene Labrum, MD as Consulting Physician (Psychiatry) Van Clines, MD as Consulting Physician (Neurology)  Indicate any recent Medical Services you may have received from other than [facility] providers in the past year (date may be approximate).     Assessment:   This is a routine  wellness examination for Diana Watson. Physical assessment deferred to PCP.   Hearing/Vision screen Hearing Screening Comments: Able to hear conversational tones w/o difficulty. No issues reported.  Passed whisper test  Vision Screening Comments: appointment yearly Dr. Nile Riggs  Dietary issues and exercise activities discussed: Current Exercise Habits: Structured exercise class, Type of exercise: stretching;strength training/weights;calisthenics  Diet (meal preparation, eat out, water intake, caffeinated beverages, dairy products, fruits and vegetables): in general, a "healthy" diet  , well balanced, eats a variety of fruits and vegetables daily, limits salt, fat/cholesterol, sugar,carbohydrates,caffeine, drinks 6-8 glasses of water daily.  Goals    . Patient Stated     I want to take 30 minutes per day to read something spiritual and mediate about what I read.      Depression Screen PHQ 2/9 Scores 08/23/2017  PHQ - 2 Score 2  PHQ- 9 Score 4    Fall Risk Fall Risk  09/27/2016  Falls in the past year? No   Cognitive Function:   Montreal Cognitive Assessment  09/27/2016  Visuospatial/ Executive (0/5) 4  Naming (0/3) 3  Attention: Read list of digits (0/2) 2  Attention: Read list of letters (0/1) 1  Attention: Serial 7 subtraction starting at 100 (0/3) 3  Language: Repeat phrase (0/2) 2  Language : Fluency  (0/1) 1  Abstraction (0/2) 2  Delayed Recall (0/5) 5  Orientation (0/6) 6  Total 29      Screening Tests Health Maintenance  Topic Date Due  . HIV Screening  06/01/1972  . PAP SMEAR  04/16/2017  . INFLUENZA VACCINE  10/31/2017  . HEMOGLOBIN A1C  01/23/2018  . FOOT EXAM  04/15/2018  . OPHTHALMOLOGY EXAM  08/10/2018  . MAMMOGRAM  08/22/2018  . PNEUMOCOCCAL POLYSACCHARIDE VACCINE (2) 01/04/2021  . TETANUS/TDAP  01/05/2022  . COLONOSCOPY  06/07/2026  . Hepatitis C Screening  Completed     Plan:    Patient states she will call and make a GYN appointment to schedule screening pap smear, resource provided.  Continue doing brain stimulating activities (puzzles, reading, adult coloring books, staying active) to keep memory sharp.   Continue to eat heart healthy diet (full of fruits, vegetables, whole grains, lean protein, water--limit salt, fat, and sugar intake) and increase physical activity as tolerated.  I have personally reviewed and noted the following in the patient's chart:   . Medical and social history . Use of alcohol, tobacco or illicit drugs  . Current medications and supplements . Functional ability and status . Nutritional status . Physical activity . Advanced directives . List of other physicians . Vitals . Screenings to include cognitive, depression, and falls . Referrals and appointments  In addition, I have reviewed and discussed with patient certain preventive protocols, quality metrics, and best practice recommendations. A written personalized care plan for preventive services as well as general preventive health recommendations were provided to patient.     Wanda Plump, RN   08/23/2017

## 2017-08-23 NOTE — Patient Instructions (Addendum)
[facility] in [place], [place]  Address: [address] [place], [place] [address] Hours:  Phone: [phone] Suggest an edit  Continue doing brain stimulating activities (puzzles, reading, adult coloring books, staying active) to keep memory sharp.   Continue to eat heart healthy diet (full of fruits, vegetables, whole grains, lean protein, water--limit salt, fat, and sugar intake) and increase physical activity as tolerated.   Diana Watson , Thank you for taking time to come for your Medicare Wellness Visit. I appreciate your ongoing commitment to your health goals. Please review the following plan we discussed and let me know if I can assist you in the future.   These are the goals we discussed: Goals    . Patient Stated     I want to 30 minutes per day to read something spiritual and mediate about what I read.       This is a list of the screening recommended for you and due dates:  Health Maintenance  Topic Date Due  . HIV Screening  06/01/1972  . Pap Smear  04/16/2017  . Flu Shot  10/31/2017  . Hemoglobin A1C  01/23/2018  . Complete foot exam   04/15/2018  . Eye exam for diabetics  08/10/2018  . Mammogram  08/22/2018  . Pneumococcal vaccine (2) 01/04/2021  . Tetanus Vaccine  01/05/2022  . Colon Cancer Screening  06/07/2026  .  Hepatitis C: One time screening is recommended by Center for Disease Control  (CDC) for  adults born from [birth year] through 1963.   Completed   Health Maintenance, Female Adopting a healthy lifestyle and getting preventive care can go a long way to promote health and wellness. Talk with your health care provider about what schedule of regular examinations is right for you. This is a good chance for you to check in with your provider about disease prevention and staying healthy. In between checkups, there are plenty of things you can do on your own. Experts have done a lot of research about  which lifestyle changes and preventive measures are most likely to keep you healthy. Ask your health care provider for more information. Weight and diet Eat a healthy diet  Be sure to include plenty of vegetables, fruits, low-fat dairy products, and lean protein.  Do not eat a lot of foods high in solid fats, added sugars, or salt.  Get regular exercise. This is one of the most important things you can do for your health. ? Most adults should exercise for at least 150 minutes each week. The exercise should increase your heart rate and make you sweat (moderate-intensity exercise). ? Most adults should also do strengthening exercises at least twice a week. This is in addition to the moderate-intensity exercise.  Maintain a healthy weight  Body mass index (BMI) is a measurement that can be used to identify possible weight problems. It estimates body fat based on height and weight. Your health care provider can help determine your BMI and help you achieve or maintain a healthy weight.  For females 9 years of age and older: ? A BMI below 18.5 is considered underweight. ? A BMI of 18.5 to 24.9 is normal. ? A BMI of 25 to 29.9 is considered overweight. ? A BMI of 30 and above is considered obese.  Watch levels of cholesterol and blood lipids  You should start having your blood tested for lipids and cholesterol at 60 years of age, then have this test  every 5 years.  You may need to have your cholesterol levels checked more often if: ? Your lipid or cholesterol levels are high. ? You are older than 60 years of age. ? You are at high risk for heart disease.  Cancer screening Lung Cancer  Lung cancer screening is recommended for adults 71-8 years old who are at high risk for lung cancer because of a history of smoking.  A yearly low-dose CT scan of the lungs is recommended for people who: ? Currently smoke. ? Have quit within the past 15 years. ? Have at least a 30-pack-year history of  smoking. A pack year is smoking an average of one pack of cigarettes a day for 1 year.  Yearly screening should continue until it has been 15 years since you quit.  Yearly screening should stop if you develop a health problem that would prevent you from having lung cancer treatment.  Breast Cancer  Practice breast self-awareness. This means understanding how your breasts normally appear and feel.  It also means doing regular breast self-exams. Let your health care provider know about any changes, no matter how small.  If you are in your 20s or 30s, you should have a clinical breast exam (CBE) by a health care provider every 1-3 years as part of a regular health exam.  If you are 31 or older, have a CBE every year. Also consider having a breast X-ray (mammogram) every year.  If you have a family history of breast cancer, talk to your health care provider about genetic screening.  If you are at high risk for breast cancer, talk to your health care provider about having an MRI and a mammogram every year.  Breast cancer gene (BRCA) assessment is recommended for women who have family members with BRCA-related cancers. BRCA-related cancers include: ? Breast. ? Ovarian. ? Tubal. ? Peritoneal cancers.  Results of the assessment will determine the need for genetic counseling and BRCA1 and BRCA2 testing.  Cervical Cancer Your health care provider may recommend that you be screened regularly for cancer of the pelvic organs (ovaries, uterus, and vagina). This screening involves a pelvic examination, including checking for microscopic changes to the surface of your cervix (Pap test). You may be encouraged to have this screening done every 3 years, beginning at age 12.  For women ages 84-65, health care providers may recommend pelvic exams and Pap testing every 3 years, or they may recommend the Pap and pelvic exam, combined with testing for human papilloma virus (HPV), every 5 years. Some types of  HPV increase your risk of cervical cancer. Testing for HPV may also be done on women of any age with unclear Pap test results.  Other health care providers may not recommend any screening for nonpregnant women who are considered low risk for pelvic cancer and who do not have symptoms. Ask your health care provider if a screening pelvic exam is right for you.  If you have had past treatment for cervical cancer or a condition that could lead to cancer, you need Pap tests and screening for cancer for at least 20 years after your treatment. If Pap tests have been discontinued, your risk factors (such as having a new sexual partner) need to be reassessed to determine if screening should resume. Some women have medical problems that increase the chance of getting cervical cancer. In these cases, your health care provider may recommend more frequent screening and Pap tests.  Colorectal Cancer  This type of  cancer can be detected and often prevented.  Routine colorectal cancer screening usually begins at 60 years of age and continues through 60 years of age.  Your health care provider may recommend screening at an earlier age if you have risk factors for colon cancer.  Your health care provider may also recommend using home test kits to check for hidden blood in the stool.  A small camera at the end of a tube can be used to examine your colon directly (sigmoidoscopy or colonoscopy). This is done to check for the earliest forms of colorectal cancer.  Routine screening usually begins at age 17.  Direct examination of the colon should be repeated every 5-10 years through 60 years of age. However, you may need to be screened more often if early forms of precancerous polyps or small growths are found.  Skin Cancer  Check your skin from head to toe regularly.  Tell your health care provider about any new moles or changes in moles, especially if there is a change in a mole's shape or color.  Also tell  your health care provider if you have a mole that is larger than the size of a pencil eraser.  Always use sunscreen. Apply sunscreen liberally and repeatedly throughout the day.  Protect yourself by wearing long sleeves, pants, a wide-brimmed hat, and sunglasses whenever you are outside.  Heart disease, diabetes, and high blood pressure  High blood pressure causes heart disease and increases the risk of stroke. High blood pressure is more likely to develop in: ? People who have blood pressure in the high end of the normal range (130-139/85-89 mm Hg). ? People who are overweight or obese. ? People who are African American.  If you are 69-55 years of age, have your blood pressure checked every 3-5 years. If you are 4 years of age or older, have your blood pressure checked every year. You should have your blood pressure measured twice-once when you are at a hospital or clinic, and once when you are not at a hospital or clinic. Record the average of the two measurements. To check your blood pressure when you are not at a hospital or clinic, you can use: ? An automated blood pressure machine at a pharmacy. ? A home blood pressure monitor.  If you are between 33 years and 20 years old, ask your health care provider if you should take aspirin to prevent strokes.  Have regular diabetes screenings. This involves taking a blood sample to check your fasting blood sugar level. ? If you are at a normal weight and have a low risk for diabetes, have this test once every three years after 60 years of age. ? If you are overweight and have a high risk for diabetes, consider being tested at a younger age or more often. Preventing infection Hepatitis B  If you have a higher risk for hepatitis B, you should be screened for this virus. You are considered at high risk for hepatitis B if: ? You were born in a country where hepatitis B is common. Ask your health care provider which countries are considered high  risk. ? Your parents were born in a high-risk country, and you have not been immunized against hepatitis B (hepatitis B vaccine). ? You have HIV or AIDS. ? You use needles to inject street drugs. ? You live with someone who has hepatitis B. ? You have had sex with someone who has hepatitis B. ? You get hemodialysis treatment. ? You  take certain medicines for conditions, including cancer, organ transplantation, and autoimmune conditions.  Hepatitis C  Blood testing is recommended for: ? Everyone born from [birth year] through 1963. ? Anyone with known risk factors for hepatitis C.  Sexually transmitted infections (STIs)  You should be screened for sexually transmitted infections (STIs) including gonorrhea and chlamydia if: ? You are sexually active and are younger than 60 years of age. ? You are older than 60 years of age and your health care provider tells you that you are at risk for this type of infection. ? Your sexual activity has changed since you were last screened and you are at an increased risk for chlamydia or gonorrhea. Ask your health care provider if you are at risk.  If you do not have HIV, but are at risk, it may be recommended that you take a prescription medicine daily to prevent HIV infection. This is called pre-exposure prophylaxis (PrEP). You are considered at risk if: ? You are sexually active and do not regularly use condoms or know the HIV status of your partner(s). ? You take drugs by injection. ? You are sexually active with a partner who has HIV.  Talk with your health care provider about whether you are at high risk of being infected with HIV. If you choose to begin PrEP, you should first be tested for HIV. You should then be tested every 3 months for as long as you are taking PrEP. Pregnancy  If you are premenopausal and you may become pregnant, ask your health care provider about preconception counseling.  If you may become pregnant, take 400 to 800 micrograms  (mcg) of folic acid every day.  If you want to prevent pregnancy, talk to your health care provider about birth control (contraception). Osteoporosis and menopause  Osteoporosis is a disease in which the bones lose minerals and strength with aging. This can result in serious bone fractures. Your risk for osteoporosis can be identified using a bone density scan.  If you are 32 years of age or older, or if you are at risk for osteoporosis and fractures, ask your health care provider if you should be screened.  Ask your health care provider whether you should take a calcium or vitamin D supplement to lower your risk for osteoporosis.  Menopause may have certain physical symptoms and risks.  Hormone replacement therapy may reduce some of these symptoms and risks. Talk to your health care provider about whether hormone replacement therapy is right for you. Follow these instructions at home:  Schedule regular health, dental, and eye exams.  Stay current with your immunizations.  Do not use any tobacco products including cigarettes, chewing tobacco, or electronic cigarettes.  If you are pregnant, do not drink alcohol.  If you are breastfeeding, limit how much and how often you drink alcohol.  Limit alcohol intake to no more than 1 drink per day for nonpregnant women. One drink equals 12 ounces of beer, 5 ounces of wine, or 1 ounces of hard liquor.  Do not use street drugs.  Do not share needles.  Ask your health care provider for help if you need support or information about quitting drugs.  Tell your health care provider if you often feel depressed.  Tell your health care provider if you have ever been abused or do not feel safe at home. This information is not intended to replace advice given to you by your health care provider. Make sure you discuss any questions you have with  your health care provider. Document Released: 10/02/2010 Document Revised: 08/25/2015 Document Reviewed:  12/21/2014 Elsevier Interactive Patient Education  [employer].

## 2017-08-23 NOTE — Progress Notes (Signed)
Medical treatment/procedure(s) were performed by non-physician practitioner and as supervising physician I was immediately available for consultation/collaboration. I agree with above. Terron Merfeld A Tephanie Escorcia, MD  

## 2017-09-16 ENCOUNTER — Ambulatory Visit: Payer: Self-pay

## 2017-10-09 DIAGNOSIS — F314 Bipolar disorder, current episode depressed, severe, without psychotic features: Secondary | ICD-10-CM | POA: Diagnosis not present

## 2017-10-18 ENCOUNTER — Other Ambulatory Visit: Payer: Self-pay | Admitting: Internal Medicine

## 2017-11-06 ENCOUNTER — Ambulatory Visit
Admission: RE | Admit: 2017-11-06 | Discharge: 2017-11-06 | Disposition: A | Discharge status: Home / Self Care | Payer: Medicare HMO | Source: Ambulatory Visit | Attending: Internal Medicine | Admitting: Internal Medicine

## 2017-11-06 DIAGNOSIS — Z1231 Encounter for screening mammogram for malignant neoplasm of breast: Secondary | ICD-10-CM | POA: Diagnosis not present

## 2017-11-06 DIAGNOSIS — Z139 Encounter for screening, unspecified: Secondary | ICD-10-CM

## 2017-11-11 ENCOUNTER — Telehealth: Payer: Self-pay | Admitting: Internal Medicine

## 2017-11-11 NOTE — Telephone Encounter (Signed)
Copied from CRM [id number]. Topic: General - Other >> Nov 11, 2017  1:21 PM Percival SpanishKennedy, Cheryl W wrote:  Pt is asking if  Dr Okey Duprerawford will except her husband as a new pt since she is already her pt   [phone]

## 2017-11-11 NOTE — Telephone Encounter (Signed)
This is duplicative and should not have been opened.

## 2017-11-14 ENCOUNTER — Encounter: Payer: Self-pay | Admitting: Internal Medicine

## 2017-11-14 ENCOUNTER — Ambulatory Visit (INDEPENDENT_AMBULATORY_CARE_PROVIDER_SITE_OTHER): Payer: Medicare HMO | Admitting: Internal Medicine

## 2017-11-14 VITALS — BP 130/80 | HR 77 | Temp 98.2°F | Ht 65.0 in | Wt 148.0 lb

## 2017-11-14 DIAGNOSIS — K5900 Constipation, unspecified: Secondary | ICD-10-CM | POA: Diagnosis not present

## 2017-11-14 DIAGNOSIS — E119 Type 2 diabetes mellitus without complications: Secondary | ICD-10-CM

## 2017-11-14 LAB — POCT GLYCOSYLATED HEMOGLOBIN (HGB A1C): HEMOGLOBIN A1C: 5.4 % (ref 4.0–5.6)

## 2017-11-14 MED ORDER — PLECANATIDE 3 MG PO TABS: 3.0000 mg | ORAL_TABLET | Freq: Every day | ORAL | 1 refills | Status: DC

## 2017-11-14 NOTE — Progress Notes (Signed)
   Subjective:    Patient ID: Diana BoringDonna Valdes-Jeffers, female    DOB: 03/25/1956, 62 y.o.   MRN: [id number]  HPI The patient is a 62 YO female coming in for follow up of her diabetes (well controlled on her acarbose, glipizide, on ACE-I and statin, denies changes in diet, exercise stable, complicated by hyperlipidemia). She has lost about 20 pounds in the last 4 months by eliminating sugars from diet and walking 2 miles per day. Denies low sugars. She is still having problems with constipation. She did not do well with linzess. Using lactulose and this causes pain, bloating but she is able to have BM.   Review of Systems  Constitutional: Negative.   HENT: Negative.   Eyes: Negative.   Respiratory: Negative for cough, chest tightness and shortness of breath.   Cardiovascular: Negative for chest pain, palpitations and leg swelling.  Gastrointestinal: Negative for abdominal distention, abdominal pain, constipation, diarrhea, nausea and vomiting.  Musculoskeletal: Negative.   Skin: Negative.   Neurological: Negative.   Psychiatric/Behavioral: Negative.       Objective:   Physical Exam  Constitutional: She is oriented to person, place, and time. She appears well-developed and well-nourished.  HENT:  Head: Normocephalic and atraumatic.  Eyes: EOM are normal.  Neck: Normal range of motion.  Cardiovascular: Normal rate and regular rhythm.  Pulmonary/Chest: Effort normal and breath sounds normal. No respiratory distress. She has no wheezes. She has no rales.  Abdominal: Soft. Bowel sounds are normal. She exhibits no distension. There is no tenderness. There is no rebound.  Musculoskeletal: She exhibits no edema.  Neurological: She is alert and oriented to person, place, and time. Coordination normal.  Skin: Skin is warm and dry.   Vitals:   11/14/17 0900  BP: 130/80  Pulse: 77  Temp: 98.2 F (36.8 C)  TempSrc: Oral  SpO2: 99%  Weight: 148 lb (67.1 kg)  Height: 5\' 5"  (1.651 m)        Assessment & Plan:

## 2017-11-14 NOTE — Assessment & Plan Note (Signed)
Rx for trulance to see if this helps.

## 2017-11-14 NOTE — Assessment & Plan Note (Signed)
HgA1c 5.4 in the office today. Will stop glipizide to encourage weight loss and avoid any low sugars.

## 2017-11-14 NOTE — Patient Instructions (Addendum)
We have sent in the trulance to take 1 pill daily to see if this can help better with constipation.  The HgA1c was 5.4 which means we can stop the glipizide.  This medicine can cause weight gain which means you may lose weight better without it.

## 2017-11-16 DIAGNOSIS — F319 Bipolar disorder, unspecified: Secondary | ICD-10-CM | POA: Diagnosis not present

## 2017-11-16 DIAGNOSIS — Z6823 Body mass index (BMI) 23.0-23.9, adult: Secondary | ICD-10-CM | POA: Diagnosis not present

## 2017-11-16 DIAGNOSIS — G250 Essential tremor: Secondary | ICD-10-CM | POA: Diagnosis not present

## 2017-11-16 DIAGNOSIS — E119 Type 2 diabetes mellitus without complications: Secondary | ICD-10-CM | POA: Diagnosis not present

## 2017-11-16 DIAGNOSIS — K5900 Constipation, unspecified: Secondary | ICD-10-CM | POA: Diagnosis not present

## 2017-11-16 DIAGNOSIS — Z7984 Long term (current) use of oral hypoglycemic drugs: Secondary | ICD-10-CM | POA: Diagnosis not present

## 2017-11-16 DIAGNOSIS — I10 Essential (primary) hypertension: Secondary | ICD-10-CM | POA: Diagnosis not present

## 2017-11-16 DIAGNOSIS — E785 Hyperlipidemia, unspecified: Secondary | ICD-10-CM | POA: Diagnosis not present

## 2018-01-14 DIAGNOSIS — F314 Bipolar disorder, current episode depressed, severe, without psychotic features: Secondary | ICD-10-CM | POA: Diagnosis not present

## 2018-03-07 ENCOUNTER — Ambulatory Visit (INDEPENDENT_AMBULATORY_CARE_PROVIDER_SITE_OTHER): Payer: Medicare HMO | Admitting: Internal Medicine

## 2018-03-07 ENCOUNTER — Encounter: Payer: Self-pay | Admitting: Internal Medicine

## 2018-03-07 ENCOUNTER — Other Ambulatory Visit (INDEPENDENT_AMBULATORY_CARE_PROVIDER_SITE_OTHER): Payer: Medicare HMO

## 2018-03-07 VITALS — BP 136/80 | HR 72 | Temp 98.2°F | Ht 65.0 in | Wt 138.0 lb

## 2018-03-07 DIAGNOSIS — E1169 Type 2 diabetes mellitus with other specified complication: Secondary | ICD-10-CM | POA: Diagnosis not present

## 2018-03-07 DIAGNOSIS — E785 Hyperlipidemia, unspecified: Secondary | ICD-10-CM

## 2018-03-07 DIAGNOSIS — E119 Type 2 diabetes mellitus without complications: Secondary | ICD-10-CM

## 2018-03-07 DIAGNOSIS — I10 Essential (primary) hypertension: Secondary | ICD-10-CM | POA: Diagnosis not present

## 2018-03-07 LAB — CBC
HCT: 39.5 % (ref 36.0–46.0)
HEMOGLOBIN: 13.2 g/dL (ref 12.0–15.0)
MCHC: 33.5 g/dL (ref 30.0–36.0)
MCV: 97.2 fl (ref 78.0–100.0)
PLATELETS: 184 10*3/uL (ref 150.0–400.0)
RBC: 4.06 Mil/uL (ref 3.87–5.11)
RDW: 12.7 % (ref 11.5–15.5)
WBC: 5.3 10*3/uL (ref 4.0–10.5)

## 2018-03-07 LAB — COMPREHENSIVE METABOLIC PANEL
ALK PHOS: 70 U/L (ref 39–117)
ALT: 26 U/L (ref 0–35)
AST: 20 U/L (ref 0–37)
Albumin: 4.7 g/dL (ref 3.5–5.2)
BILIRUBIN TOTAL: 0.2 mg/dL (ref 0.2–1.2)
BUN: 24 mg/dL — ABNORMAL HIGH (ref 6–23)
CALCIUM: 10 mg/dL (ref 8.4–10.5)
CO2: 32 mEq/L (ref 19–32)
CREATININE: 1.05 mg/dL (ref 0.40–1.20)
Chloride: 101 mEq/L (ref 96–112)
GFR: 68.58 mL/min (ref >60.00)
Glucose, Bld: 97 mg/dL (ref 70–99)
Potassium: 4.5 mEq/L (ref 3.5–5.1)
Sodium: 140 mEq/L (ref 135–145)
TOTAL PROTEIN: 8 g/dL (ref 6.0–8.3)

## 2018-03-07 LAB — HEMOGLOBIN A1C: Hgb A1c MFr Bld: 5.9 % (ref 4.6–6.5)

## 2018-03-07 LAB — LIPID PANEL
CHOL/HDL RATIO: 2
CHOLESTEROL: 140 mg/dL (ref 0–200)
HDL: 63.8 mg/dL (ref >39.00)
LDL CALC: 43 mg/dL (ref 0–99)
NonHDL: 76.64
TRIGLYCERIDES: 166 mg/dL — AB (ref 0.0–149.0)
VLDL: 33.2 mg/dL (ref 0.0–40.0)

## 2018-03-07 NOTE — Assessment & Plan Note (Signed)
Checking HgA1c and adjust acarbose if needed. On ACE-I and statin. Complicated by hyperlipidemia.

## 2018-03-07 NOTE — Progress Notes (Signed)
   Subjective:    Patient ID: Diana Watson, female    DOB: 01/31/1956, 62 y.o.   MRN: [id number]  HPI The patient is a 62 YO female coming in for follow up of her diabetes (controlled with acarbose, likely caused in part by her psych medications, denies new numbness or tingling, denies thirst or urinary frequency, on ACE-I and statin) and cholesterol (taking crestor 5 mg daily, denies muscle aches, denies chest pains or stroke symptoms) and blood pressure (taking lisinopril and BP at goal, denies side effects, denies chest pains or headaches).   Review of Systems  Constitutional: Negative.   HENT: Negative.   Eyes: Negative.   Respiratory: Negative for cough, chest tightness and shortness of breath.   Cardiovascular: Negative for chest pain, palpitations and leg swelling.  Gastrointestinal: Negative for abdominal distention, abdominal pain, constipation, diarrhea, nausea and vomiting.  Musculoskeletal: Negative.   Skin: Negative.   Neurological: Negative.   Psychiatric/Behavioral: Negative.       Objective:   Physical Exam  Constitutional: She is oriented to person, place, and time. She appears well-developed and well-nourished.  HENT:  Head: Normocephalic and atraumatic.  Eyes: EOM are normal.  Neck: Normal range of motion.  Cardiovascular: Normal rate and regular rhythm.  Pulmonary/Chest: Effort normal and breath sounds normal. No respiratory distress. She has no wheezes. She has no rales.  Abdominal: Soft. Bowel sounds are normal. She exhibits no distension. There is no tenderness. There is no rebound.  Musculoskeletal: She exhibits no edema.  Neurological: She is alert and oriented to person, place, and time. Coordination normal.  Skin: Skin is warm and dry.  Psychiatric: She has a normal mood and affect.   Vitals:   03/07/18 0839  BP: 136/80  Pulse: 72  Temp: 98.2 F (36.8 C)  TempSrc: Oral  SpO2: 98%  Weight: 138 lb (62.6 kg)  Height: 5\' 5"  (1.651 m)        Assessment & Plan:

## 2018-03-07 NOTE — Assessment & Plan Note (Signed)
Taking lisinopril 30 mg daily, checking CMP and adjust as needed.

## 2018-03-07 NOTE — Patient Instructions (Signed)
We will check the labs today. 

## 2018-03-07 NOTE — Assessment & Plan Note (Signed)
Checking lipid panel and adjust as needed. Taking crestor 5 mg daily.

## 2018-03-25 ENCOUNTER — Other Ambulatory Visit: Payer: Self-pay | Admitting: Internal Medicine

## 2018-04-10 DIAGNOSIS — F314 Bipolar disorder, current episode depressed, severe, without psychotic features: Secondary | ICD-10-CM | POA: Diagnosis not present

## 2018-04-15 ENCOUNTER — Other Ambulatory Visit: Payer: Self-pay | Admitting: Internal Medicine

## 2018-05-06 ENCOUNTER — Other Ambulatory Visit: Payer: Self-pay | Admitting: Internal Medicine

## 2018-06-10 ENCOUNTER — Other Ambulatory Visit: Payer: Self-pay | Admitting: Internal Medicine

## 2018-06-11 DIAGNOSIS — F314 Bipolar disorder, current episode depressed, severe, without psychotic features: Secondary | ICD-10-CM | POA: Diagnosis not present

## 2018-06-24 ENCOUNTER — Other Ambulatory Visit: Payer: Self-pay | Admitting: Internal Medicine

## 2018-06-24 MED ORDER — ALCOHOL SWABS PADS: MEDICATED_PAD | 3 refills | Status: DC

## 2018-07-14 DIAGNOSIS — F314 Bipolar disorder, current episode depressed, severe, without psychotic features: Secondary | ICD-10-CM | POA: Diagnosis not present

## 2018-07-24 ENCOUNTER — Ambulatory Visit: Payer: Self-pay | Admitting: *Deleted

## 2018-07-24 NOTE — Telephone Encounter (Signed)
Pt left message on COVID voicemail 06/23/2018 at 0945 requesting masks for herself and her husband; the pt says that she is high risk because she has diabetes; she says that her husband has diabetes and COPD; spoke with Sam at [facility] and she states that the office is not giving scarfs out to patients who are not coming in for a visit; pt notified; the patient says that she is not exhibiting symptoms; also informed pt that she can purchase masks from stores, or go to [email or web address] for instructions on how to make a mask; she verbalized understanding; the pt normally sees Dr Hillard Danker, [facility]; will route to office for notification.  Reason for Disposition . [1] Caller requesting NON-URGENT health information AND [2] PCP's office is the best resource  Answer Assessment - Initial Assessment Questions 1. REASON FOR CALL or QUESTION: "What is your reason for calling today?" or "How can I best help you?" or "What question do you have that I can help answer?"     Are masks available for patients?  Protocols used: INFORMATION ONLY CALL-A-AH

## 2018-09-01 ENCOUNTER — Ambulatory Visit: Payer: Self-pay

## 2018-09-01 NOTE — Telephone Encounter (Signed)
   Reason for Disposition . [1] Last tetanus shot > 5 years ago AND [2] DIRTY cut or scrape  Answer Assessment - Initial Assessment Questions 1. APPEARANCE of INJURY: "What does the injury look like?"      Bleeding alot 2. SIZE: "How large is the cut?"      *No Answer*inch 3. BLEEDING: "Is it bleeding now?" If so, ask: "Is it difficult to stop?"      yes 4. LOCATION: "Where is the injury located?"    Right pointing finger in the middle 5. ONSET: "How long ago did the injury occur?"      About hour 6. MECHANISM: "Tell me how it happened."      Cut fing on food processor 7. TETANUS: "When was the last tetanus booster?"     Not sure  8. PREGNANCY: "Is there any chance you are pregnant?" "When was your last menstrual period?"     no  Protocols used: FINGER INJURY-A-AH, CUTS AND LACERATIONS-A-AH

## 2018-09-01 NOTE — Telephone Encounter (Signed)
Incoming call from Patient whom states that she cut her finger cleaning the food processor bleeding wont stop .  Asked if Patient is applying pressure and holding finger upright. After about 2 min.  Patient states that has stopped bleeding.

## 2018-09-04 DIAGNOSIS — F314 Bipolar disorder, current episode depressed, severe, without psychotic features: Secondary | ICD-10-CM | POA: Diagnosis not present

## 2018-10-09 ENCOUNTER — Telehealth: Payer: Self-pay | Admitting: *Deleted

## 2018-10-09 NOTE — Telephone Encounter (Signed)
Called patient to schedule AWV, patient scheduled on 10/13/18.

## 2018-10-10 ENCOUNTER — Ambulatory Visit (INDEPENDENT_AMBULATORY_CARE_PROVIDER_SITE_OTHER): Payer: Medicare HMO | Admitting: Internal Medicine

## 2018-10-10 ENCOUNTER — Other Ambulatory Visit (INDEPENDENT_AMBULATORY_CARE_PROVIDER_SITE_OTHER): Payer: Medicare HMO

## 2018-10-10 ENCOUNTER — Encounter: Payer: Self-pay | Admitting: Internal Medicine

## 2018-10-10 ENCOUNTER — Other Ambulatory Visit: Payer: Self-pay

## 2018-10-10 VITALS — BP 130/82 | HR 82 | Temp 98.8°F | Ht 65.0 in | Wt 142.0 lb

## 2018-10-10 DIAGNOSIS — R7989 Other specified abnormal findings of blood chemistry: Secondary | ICD-10-CM

## 2018-10-10 DIAGNOSIS — Z0000 Encounter for general adult medical examination without abnormal findings: Secondary | ICD-10-CM | POA: Diagnosis not present

## 2018-10-10 DIAGNOSIS — E1169 Type 2 diabetes mellitus with other specified complication: Secondary | ICD-10-CM

## 2018-10-10 DIAGNOSIS — I10 Essential (primary) hypertension: Secondary | ICD-10-CM

## 2018-10-10 DIAGNOSIS — E785 Hyperlipidemia, unspecified: Secondary | ICD-10-CM | POA: Diagnosis not present

## 2018-10-10 LAB — CBC
HCT: 39.6 % (ref 36.0–46.0)
Hemoglobin: 13 g/dL (ref 12.0–15.0)
MCHC: 32.9 g/dL (ref 30.0–36.0)
MCV: 99 fl (ref 78.0–100.0)
Platelets: 176 10*3/uL (ref 150.0–400.0)
RBC: 4 Mil/uL (ref 3.87–5.11)
RDW: 13.2 % (ref 11.5–15.5)
WBC: 5.5 10*3/uL (ref 4.0–10.5)

## 2018-10-10 LAB — LIPID PANEL
Cholesterol: 161 mg/dL (ref 0–200)
HDL: 80.2 mg/dL (ref >39.00)
LDL Cholesterol: 64 mg/dL (ref 0–99)
NonHDL: 80.31
Total CHOL/HDL Ratio: 2
Triglycerides: 80 mg/dL (ref 0.0–149.0)
VLDL: 16 mg/dL (ref 0.0–40.0)

## 2018-10-10 LAB — COMPREHENSIVE METABOLIC PANEL
ALT: 29 U/L (ref 0–35)
AST: 24 U/L (ref 0–37)
Albumin: 4.9 g/dL (ref 3.5–5.2)
Alkaline Phosphatase: 69 U/L (ref 39–117)
BUN: 29 mg/dL — ABNORMAL HIGH (ref 6–23)
CO2: 30 mEq/L (ref 19–32)
Calcium: 9.8 mg/dL (ref 8.4–10.5)
Chloride: 100 mEq/L (ref 96–112)
Creatinine, Ser: 0.96 mg/dL (ref 0.40–1.20)
GFR: 71.41 mL/min (ref >60.00)
Glucose, Bld: 96 mg/dL (ref 70–99)
Potassium: 4.9 mEq/L (ref 3.5–5.1)
Sodium: 138 mEq/L (ref 135–145)
Total Bilirubin: 0.3 mg/dL (ref 0.2–1.2)
Total Protein: 8.4 g/dL — ABNORMAL HIGH (ref 6.0–8.3)

## 2018-10-10 LAB — T4, FREE: Free T4: 0.56 ng/dL — ABNORMAL LOW (ref 0.60–1.60)

## 2018-10-10 LAB — TSH: TSH: 1.48 u[IU]/mL (ref 0.35–4.50)

## 2018-10-10 LAB — HEMOGLOBIN A1C: Hgb A1c MFr Bld: 5.9 % (ref 4.6–6.5)

## 2018-10-10 NOTE — Patient Instructions (Signed)
Health Maintenance, Female Adopting a healthy lifestyle and getting preventive care are important in promoting health and wellness. Ask your health care provider about:  The right schedule for you to have regular tests and exams.  Things you can do on your own to prevent diseases and keep yourself healthy. What should I know about diet, weight, and exercise? Eat a healthy diet   Eat a diet that includes plenty of vegetables, fruits, low-fat dairy products, and lean protein.  Do not eat a lot of foods that are high in solid fats, added sugars, or sodium. Maintain a healthy weight Body mass index (BMI) is used to identify weight problems. It estimates body fat based on height and weight. Your health care provider can help determine your BMI and help you achieve or maintain a healthy weight. Get regular exercise Get regular exercise. This is one of the most important things you can do for your health. Most adults should:  Exercise for at least 150 minutes each week. The exercise should increase your heart rate and make you sweat (moderate-intensity exercise).  Do strengthening exercises at least twice a week. This is in addition to the moderate-intensity exercise.  Spend less time sitting. Even light physical activity can be beneficial. Watch cholesterol and blood lipids Have your blood tested for lipids and cholesterol at 61 years of age, then have this test every 5 years. Have your cholesterol levels checked more often if:  Your lipid or cholesterol levels are high.  You are older than 61 years of age.  You are at high risk for heart disease. What should I know about cancer screening? Depending on your health history and family history, you may need to have cancer screening at various ages. This may include screening for:  Breast cancer.  Cervical cancer.  Colorectal cancer.  Skin cancer.  Lung cancer. What should I know about heart disease, diabetes, and high blood  pressure? Blood pressure and heart disease  High blood pressure causes heart disease and increases the risk of stroke. This is more likely to develop in people who have high blood pressure readings, are of African descent, or are overweight.  Have your blood pressure checked: ? Every 3-5 years if you are 18-39 years of age. ? Every year if you are 40 years old or older. Diabetes Have regular diabetes screenings. This checks your fasting blood sugar level. Have the screening done:  Once every three years after age 40 if you are at a normal weight and have a low risk for diabetes.  More often and at a younger age if you are overweight or have a high risk for diabetes. What should I know about preventing infection? Hepatitis B If you have a higher risk for hepatitis B, you should be screened for this virus. Talk with your health care provider to find out if you are at risk for hepatitis B infection. Hepatitis C Testing is recommended for:  Everyone born from 1945 through 1965.  Anyone with known risk factors for hepatitis C. Sexually transmitted infections (STIs)  Get screened for STIs, including gonorrhea and chlamydia, if: ? You are sexually active and are younger than 61 years of age. ? You are older than 61 years of age and your health care provider tells you that you are at risk for this type of infection. ? Your sexual activity has changed since you were last screened, and you are at increased risk for chlamydia or gonorrhea. Ask your health care provider if   you are at risk.  Ask your health care provider about whether you are at high risk for HIV. Your health care provider may recommend a prescription medicine to help prevent HIV infection. If you choose to take medicine to prevent HIV, you should first get tested for HIV. You should then be tested every 3 months for as long as you are taking the medicine. Pregnancy  If you are about to stop having your period (premenopausal) and  you may become pregnant, seek counseling before you get pregnant.  Take 400 to 800 micrograms (mcg) of folic acid every day if you become pregnant.  Ask for birth control (contraception) if you want to prevent pregnancy. Osteoporosis and menopause Osteoporosis is a disease in which the bones lose minerals and strength with aging. This can result in bone fractures. If you are 65 years old or older, or if you are at risk for osteoporosis and fractures, ask your health care provider if you should:  Be screened for bone loss.  Take a calcium or vitamin D supplement to lower your risk of fractures.  Be given hormone replacement therapy (HRT) to treat symptoms of menopause. Follow these instructions at home: Lifestyle  Do not use any products that contain nicotine or tobacco, such as cigarettes, e-cigarettes, and chewing tobacco. If you need help quitting, ask your health care provider.  Do not use street drugs.  Do not share needles.  Ask your health care provider for help if you need support or information about quitting drugs. Alcohol use  Do not drink alcohol if: ? Your health care provider tells you not to drink. ? You are pregnant, may be pregnant, or are planning to become pregnant.  If you drink alcohol: ? Limit how much you use to 0-1 drink a day. ? Limit intake if you are breastfeeding.  Be aware of how much alcohol is in your drink. In the [place] one drink equals one 12 oz bottle of beer (355 mL), one 5 oz glass of wine (148 mL), or one 1 oz glass of hard liquor (44 mL). General instructions  Schedule regular health, dental, and eye exams.  Stay current with your vaccines.  Tell your health care provider if: ? You often feel depressed. ? You have ever been abused or do not feel safe at home. Summary  Adopting a healthy lifestyle and getting preventive care are important in promoting health and wellness.  Follow your health care provider's instructions about healthy  diet, exercising, and getting tested or screened for diseases.  Follow your health care provider's instructions on monitoring your cholesterol and blood pressure. This information is not intended to replace advice given to you by your health care provider. Make sure you discuss any questions you have with your health care provider. Document Released: 10/02/2010 Document Revised: 03/12/2018 Document Reviewed: 03/12/2018 Elsevier Patient Education  2020 Elsevier Inc.  

## 2018-10-10 NOTE — Assessment & Plan Note (Signed)
Checking HgA1c, foot exam done. Checking lipid panel. On ACE-I and statin. Adjust as needed. Complicated by hyperlipidemia.

## 2018-10-10 NOTE — Progress Notes (Signed)
Subjective:   Diana Watson is a 63 y.o. female who presents for Medicare Annual (Subsequent) preventive examination. I connected with patient by a telephone and verified that I am speaking with the correct person using two identifiers. Patient stated full name and DOB. Patient gave permission to continue with telephonic visit. Patient's location was at home and Nurse's location was at [place] office.   Review of Systems:   Cardiac Risk Factors include: advanced age (>18men, >1 women);diabetes mellitus;dyslipidemia;hypertension Sleep patterns: feels rested on waking, gets up 1 times nightly to void and sleeps 7-8 hours nightly.    Home Safety/Smoke Alarms: Feels safe in home. Smoke alarms in place.  Living environment; residence and Firearm Safety: apartment. Lives with husband, no needs for DME, good support system Seat Belt Safety/Bike Helmet: Wears seat belt.     Objective:     Vitals: There were no vitals taken for this visit.  There is no height or weight on file to calculate BMI.  Advanced Directives 10/13/2018 08/23/2017 05/23/2016 06/09/2014 04/29/2011  Does Patient Have a Medical Advance Directive? No No No No Patient does not have advance directive  Would patient like information on creating a medical advance directive? Yes (ED - Information included in AVS) Yes (ED - Information included in AVS) - No - patient declined information -  Pre-existing out of facility DNR order (yellow form or pink MOST form) - - - - No  Some encounter information is confidential and restricted. Go to Review Flowsheets activity to see all data.    Tobacco Social History   Tobacco Use  Smoking Status Never Smoker  Smokeless Tobacco Never Used     Counseling given: Not Answered  Past Medical History:  Diagnosis Date  . Bipolar 1 disorder ([place])   . Constipation   . Depression   . Diabetes mellitus   . Herniated disc   . Hyperlipidemia   . Hypertension   . Neurocognitive disorder     major neurocognitive disorder  . Personality disorder [facility])    Past Surgical History:  Procedure Laterality Date  . DILATION AND CURETTAGE OF UTERUS    . RIGHT OOPHORECTOMY     Also removed a tumor at that time.   Family History  Problem Relation Age of Onset  . Diabetes type II Mother   . Hypertension Mother   . OCD Mother   . Bipolar disorder Mother   . Diabetes type II Father   . Hypertension Father   . Bipolar disorder Cousin   . Colon cancer Neg Hx    Social History   Socioeconomic History  . Marital status: Married    Spouse name: Not on file  . Number of children: Not on file  . Years of education: Not on file  . Highest education level: Not on file  Occupational History  . Occupation: disability  Social Needs  . Financial resource strain: Not very hard  . Food insecurity    Worry: Never true    Inability: Never true  . Transportation needs    Medical: No    Non-medical: No  Tobacco Use  . Smoking status: Never Smoker  . Smokeless tobacco: Never Used  Substance and Sexual Activity  . Alcohol use: No    Alcohol/week: 6.0 - 12.0 standard drinks    Types: 6 - 12 Standard drinks or equivalent per week  . Drug use: No  . Sexual activity: Yes    Birth control/protection: None  Lifestyle  . Physical activity  Days per week: 2 days    Minutes per session: 30 min  . Stress: To some extent  Relationships  . Social connections    Talks on phone: More than three times a week    Gets together: More than three times a week    Attends religious service: More than 4 times per year    Active member of club or organization: Yes    Attends meetings of clubs or organizations: More than 4 times per year    Relationship status: Married  Other Topics Concern  . Not on file  Social History Narrative  . Not on file    Outpatient Encounter Medications as of 10/13/2018  Medication Sig  . ACCU-CHEK AVIVA PLUS test strip CHECK  BLOOD  SUGARS TWICE DAILY  . Alcohol  Swabs PADS Use daily  . aspirin 81 MG tablet Take 81 mg by mouth daily.  Marland Kitchen. buPROPion (WELLBUTRIN XL) 300 MG 24 hr tablet Take 300 mg by mouth daily.  . busPIRone (BUSPAR) 5 MG tablet Take 5 mg by mouth 2 (two) times daily.  . Calcium Citrate-Vitamin D (CALCIUM CITRATE + PO) Take by mouth.  . DOCUSATE SODIUM PO Take 400 mg by mouth.   . DOXEPIN HCL PO Take 30 mg by mouth.   . lactulose (CHRONULAC) 10 GM/15ML solution TAKE 15 ML DAILY AS NEEDED FOR MILD CONSTIPATION.  Marland Kitchen. lamoTRIgine (LAMICTAL) 200 MG tablet Take 200 mg by mouth every morning. For mood control  . lisinopril (PRINIVIL,ZESTRIL) 30 MG tablet TAKE 2 TABLETS EVERY DAY  . loperamide (IMODIUM) 2 MG capsule Take 6 mg by mouth as needed for diarrhea or loose stools.  . Multiple Vitamin (MULTIVITAMIN WITH MINERALS) TABS Take 1 tablet by mouth every morning.   . naproxen (NAPROSYN) 500 MG tablet Take 500 mg by mouth 3 (three) times daily as needed. For pain  . rosuvastatin (CRESTOR) 5 MG tablet TAKE 1 TABLET (5 MG TOTAL) BY MOUTH DAILY.  . ziprasidone (GEODON) 80 MG capsule Take 160 mg by mouth at bedtime.   . [DISCONTINUED] acarbose (PRECOSE) 25 MG tablet TAKE 1 TABLET THREE TIMES DAILY WITH MEALS (Patient not taking: Reported on 10/10/2018)  . [DISCONTINUED] benztropine (COGENTIN) 0.5 MG tablet Take 0.5 mg by mouth daily.  . [DISCONTINUED] Plecanatide (TRULANCE) 3 MG TABS Take 3 mg by mouth daily. (Patient not taking: Reported on 10/13/2018)   No facility-administered encounter medications on file as of 10/13/2018.     Activities of Daily Living In your present state of health, do you have any difficulty performing the following activities: 10/13/2018  Hearing? N  Vision? N  Difficulty concentrating or making decisions? N  Walking or climbing stairs? N  Dressing or bathing? N  Doing errands, shopping? N  Preparing Food and eating ? N  Using the Toilet? N  In the past six months, have you accidently leaked urine? N  Do you have  problems with loss of bowel control? N  Managing your Medications? N  Managing your Finances? N  Housekeeping or managing your Housekeeping? N  Some recent data might be hidden    Patient Care Team: Myrlene Brokerrawford, Elizabeth A, MD as PCP - General (Internal Medicine) Readling, Curlene Labrumandy D, MD as Consulting Physician (Psychiatry) Van ClinesAquino, Karen M, MD as Consulting Physician (Neurology)    Assessment:   This is a routine wellness examination for Lupita LeashDonna. Physical assessment deferred to PCP.   Exercise Activities and Dietary recommendations Current Exercise Habits: Home exercise routine, Type of exercise: walking;calisthenics,  Time (Minutes): 30, Frequency (Times/Week): 3, Weekly Exercise (Minutes/Week): 90, Intensity: Mild, Exercise limited by: None identified  Diet (meal preparation, eat out, water intake, caffeinated beverages, dairy products, fruits and vegetables): in general, a "healthy" diet  , well balanced   Reviewed heart healthy and diabetic diet. Encouraged patient to maintain daily water and healthy fluid intake.  Goals    . Patient Stated     I want to lose weight by increasing my physical activity and monitoring my diet more closely.       Fall Risk Fall Risk  10/13/2018 09/27/2016  Falls in the past year? 0 No  Number falls in past yr: 0 -  Risk for fall due to : History of fall(s) -    Depression Screen PHQ 2/9 Scores 10/13/2018 08/23/2017  PHQ - 2 Score 1 2  PHQ- 9 Score 1 4     Cognitive Function   Montreal Cognitive Assessment  09/27/2016  Visuospatial/ Executive (0/5) 4  Naming (0/3) 3  Attention: Read list of digits (0/2) 2  Attention: Read list of letters (0/1) 1  Attention: Serial 7 subtraction starting at 100 (0/3) 3  Language: Repeat phrase (0/2) 2  Language : Fluency (0/1) 1  Abstraction (0/2) 2  Delayed Recall (0/5) 5  Orientation (0/6) 6  Total 29      Immunization History  Administered Date(s) Administered  . Influenza-Unspecified 12/21/2015,  01/14/2017, 01/14/2018  . Pneumococcal Polysaccharide-23 01/05/2016  . Tdap 01/06/2012  . Zoster 10/12/2015   Screening Tests Health Maintenance  Topic Date Due  . HIV Screening  06/01/1972  . PAP SMEAR-Modifier  04/16/2017  . OPHTHALMOLOGY EXAM  08/10/2018  . INFLUENZA VACCINE  11/01/2018  . HEMOGLOBIN A1C  04/12/2019  . FOOT EXAM  10/10/2019  . MAMMOGRAM  11/07/2019  . TETANUS/TDAP  01/05/2022  . COLONOSCOPY  06/07/2026  . PNEUMOCOCCAL POLYSACCHARIDE VACCINE AGE 27-64 HIGH RISK  Completed  . Hepatitis C Screening  Completed      Plan:    Reviewed health maintenance screenings with patient today and relevant education, vaccines, and/or referrals were provided.   Continue doing brain stimulating activities (puzzles, reading, adult coloring books, staying active) to keep memory sharp.   Continue to eat heart healthy diet (full of fruits, vegetables, whole grains, lean protein, water--limit salt, fat, and sugar intake) and increase physical activity as tolerated.  I have personally reviewed and noted the following in the patient's chart:   . Medical and social history . Use of alcohol, tobacco or illicit drugs  . Current medications and supplements . Functional ability and status . Nutritional status . Physical activity . Advanced directives . List of other physicians . Screenings to include cognitive, depression, and falls . Referrals and appointments  In addition, I have reviewed and discussed with patient certain preventive protocols, quality metrics, and best practice recommendations. A written personalized care plan for preventive services as well as general preventive health recommendations were provided to patient.     Wanda PlumpJill A Anecia Nusbaum, RN  10/13/2018

## 2018-10-10 NOTE — Assessment & Plan Note (Signed)
Checking TSH and free T4. Not on meds.

## 2018-10-10 NOTE — Assessment & Plan Note (Signed)
Checking lipid panel and adjust crestor 5 mg daily as needed for LDL <100.

## 2018-10-10 NOTE — Assessment & Plan Note (Signed)
BP at goal, adjust lisinopril as needed. Checking CMP.

## 2018-10-10 NOTE — Progress Notes (Signed)
   Subjective:   Patient ID: Diana Watson, female    DOB: 1955-06-22, 63 y.o.   MRN: [id number]  HPI The patient is a 63 YO female coming in for physical.   PMH, [place], social history reviewed and updated  Review of Systems  Constitutional: Negative.   HENT: Negative.   Eyes: Negative.   Respiratory: Negative for cough, chest tightness and shortness of breath.   Cardiovascular: Negative for chest pain, palpitations and leg swelling.  Gastrointestinal: Negative for abdominal distention, abdominal pain, constipation, diarrhea, nausea and vomiting.  Musculoskeletal: Negative.   Skin: Negative.   Neurological: Negative.   Psychiatric/Behavioral: Negative.     Objective:  Physical Exam Constitutional:      Appearance: She is well-developed.  HENT:     Head: Normocephalic and atraumatic.  Neck:     Musculoskeletal: Normal range of motion.  Cardiovascular:     Rate and Rhythm: Normal rate and regular rhythm.  Pulmonary:     Effort: Pulmonary effort is normal. No respiratory distress.     Breath sounds: Normal breath sounds. No wheezing or rales.  Abdominal:     General: Bowel sounds are normal. There is no distension.     Palpations: Abdomen is soft.     Tenderness: There is no abdominal tenderness. There is no rebound.  Skin:    General: Skin is warm and dry.     Comments: Foot exam done  Neurological:     Mental Status: She is alert and oriented to person, place, and time.     Coordination: Coordination normal.     Vitals:   10/10/18 0822  BP: 130/82  Pulse: 82  Temp: 98.8 F (37.1 C)  TempSrc: Oral  SpO2: 98%  Weight: 142 lb (64.4 kg)  Height: 5\' 5"  (1.651 m)    Assessment & Plan:

## 2018-10-10 NOTE — Assessment & Plan Note (Signed)
Flu shot yearly. Pneumonia completed until 52. Shingrix counseled. Tetanus due 2023. Colonoscopy due 2028. Mammogram due 2021, pap smear due she states with gyn and dexa not indicated until 67. Counseled about sun safety and mole surveillance. Counseled about the dangers of distracted driving. Given 10 year screening recommendations.

## 2018-10-13 ENCOUNTER — Ambulatory Visit (INDEPENDENT_AMBULATORY_CARE_PROVIDER_SITE_OTHER): Payer: Medicare HMO | Admitting: *Deleted

## 2018-10-13 DIAGNOSIS — Z0000 Encounter for general adult medical examination without abnormal findings: Secondary | ICD-10-CM | POA: Diagnosis not present

## 2018-10-13 NOTE — Patient Instructions (Addendum)
If you cannot attend class in person, you can still exercise at home. Video taped versions of [employer] classes are shown on [employer] ([employer]) at 8 am and 1 pm Mondays through Fridays. You can also purchase a copy of the [employer] DVD by calling [phone] [employer] ([employer]) [employer] 24/7. GTN is available on [employer] channel 13 with a digital cable box and on [employer] channel 31. GTN is also available on [employer], channel 99. To view GTN, go to channel 99, press OK, select [place], then select GTN to start the channel.  Continue doing brain stimulating activities (puzzles, reading, adult coloring books, staying active) to keep memory sharp.   Continue to eat heart healthy diet (full of fruits, vegetables, whole grains, lean protein, water--limit salt, fat, and sugar intake) and increase physical activity as tolerated.   [name] , Thank you for taking time to come for your Medicare Wellness Visit. I appreciate your ongoing commitment to your health goals. Please review the following plan we discussed and let me know if I can assist you in the future.   These are the goals we discussed: Goals    . Patient Stated     I want to lose weight by increasing my physical activity and monitoring my diet more closely.       This is a list of the screening recommended for you and due dates:  Health Maintenance  Topic Date Due  . HIV Screening  06/01/1972  . Pap Smear  04/16/2017  . Eye exam for diabetics  08/10/2018  . Flu Shot  11/01/2018  . Hemoglobin A1C  04/12/2019  . Complete foot exam   10/10/2019  . Mammogram  11/07/2019  . Tetanus Vaccine  01/05/2022  . Colon Cancer Screening  06/07/2026  . Pneumococcal vaccine  Completed  .  Hepatitis C: One time screening is recommended by Center for Disease Control  (CDC) for  adults born from [birth year] through 1965.   Completed    Health Maintenance for Postmenopausal Women Menopause is a normal process in  which your ability to get pregnant comes to an end. This process happens slowly over many months or years, usually between the ages of 3148 and 7055. Menopause is complete when you have missed your menstrual periods for 12 months. It is important to talk with your health care provider about some of the most common conditions that affect women after menopause (postmenopausal women). These include heart disease, cancer, and bone loss (osteoporosis). Adopting a healthy lifestyle and getting preventive care can help to promote your health and wellness. The actions you take can also lower your chances of developing some of these common conditions. What should I know about menopause? During menopause, you may get a number of symptoms, such as:  Hot flashes. These can be moderate or severe.  Night sweats.  Decrease in sex drive.  Mood swings.  Headaches.  Tiredness.  Irritability.  Memory problems.  Insomnia. Choosing to treat or not to treat these symptoms is a decision that you make with your health care provider. Do I need hormone replacement therapy?  Hormone replacement therapy is effective in treating symptoms that are caused by menopause, such as hot flashes and night sweats.  Hormone replacement carries certain risks, especially as you become older. If you are thinking about using estrogen or estrogen with progestin, discuss the benefits and risks with your health care provider. What is my risk for heart disease and stroke? The risk of  heart disease, heart attack, and stroke increases as you age. One of the causes may be a change in the body's hormones during menopause. This can affect how your body uses dietary fats, triglycerides, and cholesterol. Heart attack and stroke are medical emergencies. There are many things that you can do to help prevent heart disease and stroke. Watch your blood pressure  High blood pressure causes heart disease and increases the risk of stroke. This is  more likely to develop in people who have high blood pressure readings, are of African descent, or are overweight.  Have your blood pressure checked: ? Every 3-5 years if you are 2418-61 years of age. ? Every year if you are 585 years old or older. Eat a healthy diet   Eat a diet that includes plenty of vegetables, fruits, low-fat dairy products, and lean protein.  Do not eat a lot of foods that are high in solid fats, added sugars, or sodium. Get regular exercise Get regular exercise. This is one of the most important things you can do for your health. Most adults should:  Try to exercise for at least 150 minutes each week. The exercise should increase your heart rate and make you sweat (moderate-intensity exercise).  Try to do strengthening exercises at least twice each week. Do these in addition to the moderate-intensity exercise.  Spend less time sitting. Even light physical activity can be beneficial. Other tips  Work with your health care provider to achieve or maintain a healthy weight.  Do not use any products that contain nicotine or tobacco, such as cigarettes, e-cigarettes, and chewing tobacco. If you need help quitting, ask your health care provider.  Know your numbers. Ask your health care provider to check your cholesterol and your blood sugar (glucose). Continue to have your blood tested as directed by your health care provider. Do I need screening for cancer? Depending on your health history and family history, you may need to have cancer screening at different stages of your life. This may include screening for:  Breast cancer.  Cervical cancer.  Lung cancer.  Colorectal cancer. What is my risk for osteoporosis? After menopause, you may be at increased risk for osteoporosis. Osteoporosis is a condition in which bone destruction happens more quickly than new bone creation. To help prevent osteoporosis or the bone fractures that can happen because of osteoporosis, you  may take the following actions:  If you are 2119-61 years old, get at least 1,000 mg of calcium and at least 600 mg of vitamin D per day.  If you are older than age 61 but younger than age 61, get at least 1,200 mg of calcium and at least 600 mg of vitamin D per day.  If you are older than age 61, get at least 1,200 mg of calcium and at least 800 mg of vitamin D per day. Smoking and drinking excessive alcohol increase the risk of osteoporosis. Eat foods that are rich in calcium and vitamin D, and do weight-bearing exercises several times each week as directed by your health care provider. How does menopause affect my mental health? Depression may occur at any age, but it is more common as you become older. Common symptoms of depression include:  Low or sad mood.  Changes in sleep patterns.  Changes in appetite or eating patterns.  Feeling an overall lack of motivation or enjoyment of activities that you previously enjoyed.  Frequent crying spells. Talk with your health care provider if you think that  you are experiencing depression. General instructions See your health care provider for regular wellness exams and vaccines. This may include:  Scheduling regular health, dental, and eye exams.  Getting and maintaining your vaccines. These include: ? Influenza vaccine. Get this vaccine each year before the flu season begins. ? Pneumonia vaccine. ? Shingles vaccine. ? Tetanus, diphtheria, and pertussis (Tdap) booster vaccine. Your health care provider may also recommend other immunizations. Tell your health care provider if you have ever been abused or do not feel safe at home. Summary  Menopause is a normal process in which your ability to get pregnant comes to an end.  This condition causes hot flashes, night sweats, decreased interest in sex, mood swings, headaches, or lack of sleep.  Treatment for this condition may include hormone replacement therapy.  Take actions to keep  yourself healthy, including exercising regularly, eating a healthy diet, watching your weight, and checking your blood pressure and blood sugar levels.  Get screened for cancer and depression. Make sure that you are up to date with all your vaccines. This information is not intended to replace advice given to you by your health care provider. Make sure you discuss any questions you have with your health care provider. Document Released: 05/11/2005 Document Revised: 03/12/2018 Document Reviewed: 03/12/2018 Elsevier Patient Education  2020 [employer].

## 2018-10-13 NOTE — Progress Notes (Signed)
Medical screening examination/treatment/procedure(s) were performed by non-physician practitioner and as supervising physician I was immediately available for consultation/collaboration. I agree with above. Albana Saperstein A Shevy Yaney, MD 

## 2018-11-06 DIAGNOSIS — F314 Bipolar disorder, current episode depressed, severe, without psychotic features: Secondary | ICD-10-CM | POA: Diagnosis not present

## 2018-11-28 DIAGNOSIS — F314 Bipolar disorder, current episode depressed, severe, without psychotic features: Secondary | ICD-10-CM | POA: Diagnosis not present

## 2018-12-03 ENCOUNTER — Other Ambulatory Visit: Payer: Self-pay | Admitting: Internal Medicine

## 2018-12-03 DIAGNOSIS — Z1231 Encounter for screening mammogram for malignant neoplasm of breast: Secondary | ICD-10-CM

## 2019-01-12 ENCOUNTER — Encounter: Payer: Self-pay | Admitting: Internal Medicine

## 2019-01-12 DIAGNOSIS — H52203 Unspecified astigmatism, bilateral: Secondary | ICD-10-CM | POA: Diagnosis not present

## 2019-01-12 DIAGNOSIS — H524 Presbyopia: Secondary | ICD-10-CM | POA: Diagnosis not present

## 2019-01-12 DIAGNOSIS — H5203 Hypermetropia, bilateral: Secondary | ICD-10-CM | POA: Diagnosis not present

## 2019-01-12 DIAGNOSIS — H25813 Combined forms of age-related cataract, bilateral: Secondary | ICD-10-CM | POA: Diagnosis not present

## 2019-01-12 DIAGNOSIS — E119 Type 2 diabetes mellitus without complications: Secondary | ICD-10-CM | POA: Diagnosis not present

## 2019-01-12 LAB — HM DIABETES EYE EXAM

## 2019-01-12 NOTE — Progress Notes (Signed)
Abstracted and sent to scan  

## 2019-01-17 ENCOUNTER — Other Ambulatory Visit: Payer: Self-pay | Admitting: Internal Medicine

## 2019-01-21 ENCOUNTER — Ambulatory Visit
Admission: RE | Admit: 2019-01-21 | Discharge: 2019-01-21 | Disposition: A | Discharge status: Home / Self Care | Payer: Medicare HMO | Source: Ambulatory Visit | Attending: Internal Medicine | Admitting: Internal Medicine

## 2019-01-21 ENCOUNTER — Other Ambulatory Visit: Payer: Self-pay

## 2019-01-21 DIAGNOSIS — Z1231 Encounter for screening mammogram for malignant neoplasm of breast: Secondary | ICD-10-CM | POA: Diagnosis not present

## 2019-01-27 ENCOUNTER — Other Ambulatory Visit: Payer: Self-pay | Admitting: Internal Medicine

## 2019-01-28 DIAGNOSIS — F1020 Alcohol dependence, uncomplicated: Secondary | ICD-10-CM | POA: Diagnosis not present

## 2019-01-28 DIAGNOSIS — F1420 Cocaine dependence, uncomplicated: Secondary | ICD-10-CM | POA: Diagnosis not present

## 2019-01-28 DIAGNOSIS — F314 Bipolar disorder, current episode depressed, severe, without psychotic features: Secondary | ICD-10-CM | POA: Diagnosis not present

## 2019-02-11 ENCOUNTER — Other Ambulatory Visit (INDEPENDENT_AMBULATORY_CARE_PROVIDER_SITE_OTHER): Payer: Medicare HMO

## 2019-02-11 ENCOUNTER — Encounter: Payer: Self-pay | Admitting: Internal Medicine

## 2019-02-11 ENCOUNTER — Other Ambulatory Visit: Payer: Self-pay

## 2019-02-11 ENCOUNTER — Ambulatory Visit (INDEPENDENT_AMBULATORY_CARE_PROVIDER_SITE_OTHER): Payer: Medicare HMO | Admitting: Internal Medicine

## 2019-02-11 VITALS — BP 120/84 | HR 65 | Temp 98.3°F | Ht 65.0 in | Wt 136.0 lb

## 2019-02-11 DIAGNOSIS — E1169 Type 2 diabetes mellitus with other specified complication: Secondary | ICD-10-CM

## 2019-02-11 LAB — COMPREHENSIVE METABOLIC PANEL
ALT: 23 U/L (ref 0–35)
AST: 24 U/L (ref 0–37)
Albumin: 4.5 g/dL (ref 3.5–5.2)
Alkaline Phosphatase: 66 U/L (ref 39–117)
BUN: 16 mg/dL (ref 6–23)
CO2: 32 mEq/L (ref 19–32)
Calcium: 9.5 mg/dL (ref 8.4–10.5)
Chloride: 101 mEq/L (ref 96–112)
Creatinine, Ser: 1.02 mg/dL (ref 0.40–1.20)
GFR: 66.51 mL/min (ref >60.00)
Glucose, Bld: 96 mg/dL (ref 70–99)
Potassium: 4.2 mEq/L (ref 3.5–5.1)
Sodium: 139 mEq/L (ref 135–145)
Total Bilirubin: 0.2 mg/dL (ref 0.2–1.2)
Total Protein: 7.9 g/dL (ref 6.0–8.3)

## 2019-02-11 LAB — POCT GLYCOSYLATED HEMOGLOBIN (HGB A1C): Hemoglobin A1C: 5.4 % (ref 4.0–5.6)

## 2019-02-11 NOTE — Patient Instructions (Signed)
Diabetes Mellitus and Standards of Medical Care Managing diabetes (diabetes mellitus) can be complicated. Your diabetes treatment may be managed by a team of health care providers, including:  A physician who specializes in diabetes (endocrinologist).  A nurse practitioner or physician assistant.  Nurses.  A diet and nutrition specialist (registered dietitian).  A certified diabetes educator (CDE).  An exercise specialist.  A pharmacist.  An eye doctor.  A foot specialist (podiatrist).  A dentist.  A primary care provider.  A mental health provider. Your health care providers follow guidelines to help you get the best quality of care. The following schedule is a general guideline for your diabetes management plan. Your health care providers may give you more specific instructions. Physical exams Upon being diagnosed with diabetes mellitus, and each year after that, your health care provider will ask about your medical and family history. He or she will also do a physical exam. Your exam may include:  Measuring your height, weight, and body mass index (BMI).  Checking your blood pressure. This will be done at every routine medical visit. Your target blood pressure may vary depending on your medical conditions, your age, and other factors.  Thyroid gland exam.  Skin exam.  Screening for damage to your nerves (peripheral neuropathy). This may include checking the pulse in your legs and feet and checking the level of sensation in your hands and feet.  A complete foot exam to inspect the structure and skin of your feet, including checking for cuts, bruises, redness, blisters, sores, or other problems.  Screening for blood vessel (vascular) problems, which may include checking the pulse in your legs and feet and checking your temperature. Blood tests Depending on your treatment plan and your personal needs, you may have the following tests done:  HbA1c (hemoglobin A1c). This  test provides information about blood sugar (glucose) control over the previous 2-3 months. It is used to adjust your treatment plan, if needed. This test will be done: ? At least 2 times a year, if you are meeting your treatment goals. ? 4 times a year, if you are not meeting your treatment goals or if treatment goals have changed.  Lipid testing, including total, LDL, and HDL cholesterol and triglyceride levels. ? The goal for LDL is less than 100 mg/dL (5.5 mmol/L). If you are at high risk for complications, the goal is less than 70 mg/dL (3.9 mmol/L). ? The goal for HDL is 40 mg/dL (2.2 mmol/L) or higher for men and 50 mg/dL (2.8 mmol/L) or higher for women. An HDL cholesterol of 60 mg/dL (3.3 mmol/L) or higher gives some protection against heart disease. ? The goal for triglycerides is less than 150 mg/dL (8.3 mmol/L).  Liver function tests.  Kidney function tests.  Thyroid function tests. Dental and eye exams  Visit your dentist two times a year.  If you have type 1 diabetes, your health care provider may recommend an eye exam 3-5 years after you are diagnosed, and then once a year after your first exam. ? For children with type 1 diabetes, a health care provider may recommend an eye exam when your child is age 10 or older and has had diabetes for 3-5 years. After the first exam, your child should get an eye exam once a year.  If you have type 2 diabetes, your health care provider may recommend an eye exam as soon as you are diagnosed, and then once a year after your first exam. Immunizations   The   yearly flu (influenza) vaccine is recommended for everyone 6 months or older who has diabetes.  The pneumonia (pneumococcal) vaccine is recommended for everyone 2 years or older who has diabetes. If you are 65 or older, you may get the pneumonia vaccine as a series of two separate shots.  The hepatitis B vaccine is recommended for adults shortly after being diagnosed with diabetes.   Adults and children with diabetes should receive all other vaccines according to age-specific recommendations from the Centers for Disease Control and Prevention (CDC). Mental and emotional health Screening for symptoms of eating disorders, anxiety, and depression is recommended at the time of diagnosis and afterward as needed. If your screening shows that you have symptoms (positive screening result), you may need more evaluation and you may work with a mental health care provider. Treatment plan Your treatment plan will be reviewed at every medical visit. You and your health care provider will discuss:  How you are taking your medicines, including insulin.  Any side effects you are experiencing.  Your blood glucose target goals.  The frequency of your blood glucose monitoring.  Lifestyle habits, such as activity level as well as tobacco, alcohol, and substance use. Diabetes self-management education Your health care provider will assess how well you are monitoring your blood glucose levels and whether you are taking your insulin correctly. He or she may refer you to:  A certified diabetes educator to manage your diabetes throughout your life, starting at diagnosis.  A registered dietitian who can create or review your personal nutrition plan.  An exercise specialist who can discuss your activity level and exercise plan. Summary  Managing diabetes (diabetes mellitus) can be complicated. Your diabetes treatment may be managed by a team of health care providers.  Your health care providers follow guidelines in order to help you get the best quality of care.  Standards of care including having regular physical exams, blood tests, blood pressure monitoring, immunizations, screening tests, and education about how to manage your diabetes.  Your health care providers may also give you more specific instructions based on your individual health. This information is not intended to replace  advice given to you by your health care provider. Make sure you discuss any questions you have with your health care provider. Document Released: 01/14/2009 Document Revised: 12/06/2017 Document Reviewed: 12/16/2015 Elsevier Patient Education  2020 Elsevier Inc.  

## 2019-02-11 NOTE — Progress Notes (Signed)
   Subjective:   Patient ID: Diana Watson, female    DOB: [birth year], 63 y.o.   MRN: [id number]  HPI The patient is a 63 YO female coming in for follow up of diabetes (is currently on diet, on ACE-I and statin, denies new numbness or tingling, denies low or high sugars and only checks if she feels bad). Her mental health is fairly stable and she is doing well with the pandemic overall. Denies new chest pains or SOB or cough or abdominal pain or diarrhea. Stable chronic constipation and is using brussel sprouts to help well lately. New cataracts in the eyes per eye doctor but he did not say if they needed to be removed. She is not having vision changes or blurring.   Review of Systems  Constitutional: Negative.   HENT: Negative.   Eyes: Negative.   Respiratory: Negative for cough, chest tightness and shortness of breath.   Cardiovascular: Negative for chest pain, palpitations and leg swelling.  Gastrointestinal: Negative for abdominal distention, abdominal pain, constipation, diarrhea, nausea and vomiting.  Musculoskeletal: Negative.   Skin: Negative.   Neurological: Negative.   Psychiatric/Behavioral: Negative.     Objective:  Physical Exam Constitutional:      Appearance: She is well-developed.  HENT:     Head: Normocephalic and atraumatic.  Neck:     Musculoskeletal: Normal range of motion.  Cardiovascular:     Rate and Rhythm: Normal rate and regular rhythm.  Pulmonary:     Effort: Pulmonary effort is normal. No respiratory distress.     Breath sounds: Normal breath sounds. No wheezing or rales.  Abdominal:     General: Bowel sounds are normal. There is no distension.     Palpations: Abdomen is soft.     Tenderness: There is no abdominal tenderness. There is no rebound.  Skin:    General: Skin is warm and dry.  Neurological:     Mental Status: She is alert and oriented to person, place, and time.     Coordination: Coordination normal.     Vitals:   02/11/19 0806   BP: 120/84  Pulse: 65  Temp: 98.3 F (36.8 C)  TempSrc: Oral  SpO2: 99%  Weight: 136 lb (61.7 kg)  Height: 5\' 5"  (1.651 m)    Assessment & Plan:

## 2019-02-11 NOTE — Assessment & Plan Note (Signed)
POC HgA1c today is 5.4 which is at goal off meds. She is not doing as well with diet recently given pandemic and wishes to continue with every 3 month follow up. She is on ACE-I and statin to help prevent complications.

## 2019-03-23 ENCOUNTER — Other Ambulatory Visit: Payer: Self-pay | Admitting: Internal Medicine

## 2019-04-15 DIAGNOSIS — Z0100 Encounter for examination of eyes and vision without abnormal findings: Secondary | ICD-10-CM | POA: Diagnosis not present

## 2019-04-17 DIAGNOSIS — F1020 Alcohol dependence, uncomplicated: Secondary | ICD-10-CM | POA: Diagnosis not present

## 2019-04-17 DIAGNOSIS — E119 Type 2 diabetes mellitus without complications: Secondary | ICD-10-CM | POA: Diagnosis not present

## 2019-04-17 DIAGNOSIS — F314 Bipolar disorder, current episode depressed, severe, without psychotic features: Secondary | ICD-10-CM | POA: Diagnosis not present

## 2019-04-17 DIAGNOSIS — F1420 Cocaine dependence, uncomplicated: Secondary | ICD-10-CM | POA: Diagnosis not present

## 2019-05-14 ENCOUNTER — Encounter: Payer: Self-pay | Admitting: Internal Medicine

## 2019-05-14 ENCOUNTER — Other Ambulatory Visit: Payer: Self-pay

## 2019-05-14 ENCOUNTER — Ambulatory Visit (INDEPENDENT_AMBULATORY_CARE_PROVIDER_SITE_OTHER): Payer: Medicare HMO | Admitting: Internal Medicine

## 2019-05-14 VITALS — BP 136/84 | HR 93 | Temp 98.1°F | Ht 65.0 in | Wt 144.0 lb

## 2019-05-14 DIAGNOSIS — I10 Essential (primary) hypertension: Secondary | ICD-10-CM

## 2019-05-14 DIAGNOSIS — Z7189 Other specified counseling: Secondary | ICD-10-CM | POA: Diagnosis not present

## 2019-05-14 DIAGNOSIS — E1169 Type 2 diabetes mellitus with other specified complication: Secondary | ICD-10-CM

## 2019-05-14 LAB — POCT GLYCOSYLATED HEMOGLOBIN (HGB A1C): Hemoglobin A1C: 5.5 % (ref 4.0–5.6)

## 2019-05-14 NOTE — Assessment & Plan Note (Signed)
POC HgA1c done in office 5.5. She is currently diet controlled. On ACE-I and statin. Reminded about eye exam. Foot exam up to date. Complicated by hyperlipidemia.

## 2019-05-14 NOTE — Progress Notes (Signed)
   Subjective:   Patient ID: Diana Watson, female    DOB: Sep 12, 1955, 64 y.o.   MRN: [id number]  HPI The patient is a 64 YO female coming in for follow up diabetes (currently diet controlled, on ACE-I and statin, denies new numbness in feet/hands, denies excessive thirst or urination) and blood pressure (taking lisinopril, BP at goal, denies headaches or chest pains, denies side effects). Denies new concerns. Has questions about covid-19 (unsure about vaccine, is [facility] witness and they are not decided about if covid-19 vaccine if acceptable, has questions about the vaccine)  Review of Systems  Constitutional: Negative.   HENT: Negative.   Eyes: Negative.   Respiratory: Negative for cough, chest tightness and shortness of breath.   Cardiovascular: Negative for chest pain, palpitations and leg swelling.  Gastrointestinal: Negative for abdominal distention, abdominal pain, constipation, diarrhea, nausea and vomiting.  Musculoskeletal: Negative.   Skin: Negative.   Neurological: Negative.   Psychiatric/Behavioral: Negative.     Objective:  Physical Exam Constitutional:      Appearance: She is well-developed.  HENT:     Head: Normocephalic and atraumatic.  Cardiovascular:     Rate and Rhythm: Normal rate and regular rhythm.  Pulmonary:     Effort: Pulmonary effort is normal. No respiratory distress.     Breath sounds: Normal breath sounds. No wheezing or rales.  Abdominal:     General: Bowel sounds are normal. There is no distension.     Palpations: Abdomen is soft.     Tenderness: There is no abdominal tenderness. There is no rebound.  Musculoskeletal:     Cervical back: Normal range of motion.  Skin:    General: Skin is warm and dry.  Neurological:     Mental Status: She is alert and oriented to person, place, and time.     Coordination: Coordination normal.     Vitals:   05/14/19 0746  BP: 136/84  Pulse: 93  Temp: 98.1 F (36.7 C)  TempSrc: Oral  SpO2: 96%    Weight: 144 lb (65.3 kg)  Height: 5\' 5"  (1.651 m)    This visit occurred during the SARS-CoV-2 public health emergency.  Safety protocols were in place, including screening questions prior to the visit, additional usage of staff PPE, and extensive cleaning of exam room while observing appropriate contact time as indicated for disinfecting solutions.   Assessment & Plan:  Visit time 20 minutes in face to face communication with patient and coordination of care, additional 10 minutes spent in record review, coordination or care, ordering tests, communicating/referring to other healthcare professionals, documenting in medical records all on the same day of the visit for total time 30 minutes spent on the visit.

## 2019-05-14 NOTE — Assessment & Plan Note (Signed)
Counseled about covid-19 vaccine and its lack of blood products. Advised that she is more than welcome to make sure it is accepted by her religion as acceptable prior to deciding to get or not. She is informed that she will likely be eligible in the next phase of vaccination.

## 2019-05-14 NOTE — Assessment & Plan Note (Signed)
BP at goal on amlodipine. Recent BMP at goal without indication for change.

## 2019-05-14 NOTE — Patient Instructions (Signed)
Diabetes Mellitus and Standards of Medical Care Managing diabetes (diabetes mellitus) can be complicated. Your diabetes treatment may be managed by a team of health care providers, including:  A physician who specializes in diabetes (endocrinologist).  A nurse practitioner or physician assistant.  Nurses.  A diet and nutrition specialist (registered dietitian).  A certified diabetes educator (CDE).  An exercise specialist.  A pharmacist.  An eye doctor.  A foot specialist (podiatrist).  A dentist.  A primary care provider.  A mental health provider. Your health care providers follow guidelines to help you get the best quality of care. The following schedule is a general guideline for your diabetes management plan. Your health care providers may give you more specific instructions. Physical exams Upon being diagnosed with diabetes mellitus, and each year after that, your health care provider will ask about your medical and family history. He or she will also do a physical exam. Your exam may include:  Measuring your height, weight, and body mass index (BMI).  Checking your blood pressure. This will be done at every routine medical visit. Your target blood pressure may vary depending on your medical conditions, your age, and other factors.  Thyroid gland exam.  Skin exam.  Screening for damage to your nerves (peripheral neuropathy). This may include checking the pulse in your legs and feet and checking the level of sensation in your hands and feet.  A complete foot exam to inspect the structure and skin of your feet, including checking for cuts, bruises, redness, blisters, sores, or other problems.  Screening for blood vessel (vascular) problems, which may include checking the pulse in your legs and feet and checking your temperature. Blood tests Depending on your treatment plan and your personal needs, you may have the following tests done:  HbA1c (hemoglobin A1c). This  test provides information about blood sugar (glucose) control over the previous 2-3 months. It is used to adjust your treatment plan, if needed. This test will be done: ? At least 2 times a year, if you are meeting your treatment goals. ? 4 times a year, if you are not meeting your treatment goals or if treatment goals have changed.  Lipid testing, including total, LDL, and HDL cholesterol and triglyceride levels. ? The goal for LDL is less than 100 mg/dL (5.5 mmol/L). If you are at high risk for complications, the goal is less than 70 mg/dL (3.9 mmol/L). ? The goal for HDL is 40 mg/dL (2.2 mmol/L) or higher for men and 50 mg/dL (2.8 mmol/L) or higher for women. An HDL cholesterol of 60 mg/dL (3.3 mmol/L) or higher gives some protection against heart disease. ? The goal for triglycerides is less than 150 mg/dL (8.3 mmol/L).  Liver function tests.  Kidney function tests.  Thyroid function tests. Dental and eye exams  Visit your dentist two times a year.  If you have type 1 diabetes, your health care provider may recommend an eye exam 3-5 years after you are diagnosed, and then once a year after your first exam. ? For children with type 1 diabetes, a health care provider may recommend an eye exam when your child is age 10 or older and has had diabetes for 3-5 years. After the first exam, your child should get an eye exam once a year.  If you have type 2 diabetes, your health care provider may recommend an eye exam as soon as you are diagnosed, and then once a year after your first exam. Immunizations   The   yearly flu (influenza) vaccine is recommended for everyone 6 months or older who has diabetes.  The pneumonia (pneumococcal) vaccine is recommended for everyone 2 years or older who has diabetes. If you are 65 or older, you may get the pneumonia vaccine as a series of two separate shots.  The hepatitis B vaccine is recommended for adults shortly after being diagnosed with  diabetes.  Adults and children with diabetes should receive all other vaccines according to age-specific recommendations from the Centers for Disease Control and Prevention (CDC). Mental and emotional health Screening for symptoms of eating disorders, anxiety, and depression is recommended at the time of diagnosis and afterward as needed. If your screening shows that you have symptoms (positive screening result), you may need more evaluation and you may work with a mental health care provider. Treatment plan Your treatment plan will be reviewed at every medical visit. You and your health care provider will discuss:  How you are taking your medicines, including insulin.  Any side effects you are experiencing.  Your blood glucose target goals.  The frequency of your blood glucose monitoring.  Lifestyle habits, such as activity level as well as tobacco, alcohol, and substance use. Diabetes self-management education Your health care provider will assess how well you are monitoring your blood glucose levels and whether you are taking your insulin correctly. He or she may refer you to:  A certified diabetes educator to manage your diabetes throughout your life, starting at diagnosis.  A registered dietitian who can create or review your personal nutrition plan.  An exercise specialist who can discuss your activity level and exercise plan. Summary  Managing diabetes (diabetes mellitus) can be complicated. Your diabetes treatment may be managed by a team of health care providers.  Your health care providers follow guidelines in order to help you get the best quality of care.  Standards of care including having regular physical exams, blood tests, blood pressure monitoring, immunizations, screening tests, and education about how to manage your diabetes.  Your health care providers may also give you more specific instructions based on your individual health. This information is not intended  to replace advice given to you by your health care provider. Make sure you discuss any questions you have with your health care provider. Document Revised: 12/06/2017 Document Reviewed: 12/16/2015 Elsevier Patient Education  2020 [employer] Inc[employer]  

## 2019-07-10 DIAGNOSIS — F314 Bipolar disorder, current episode depressed, severe, without psychotic features: Secondary | ICD-10-CM | POA: Diagnosis not present

## 2019-07-14 ENCOUNTER — Encounter: Payer: Self-pay | Admitting: Internal Medicine

## 2019-07-14 ENCOUNTER — Ambulatory Visit (INDEPENDENT_AMBULATORY_CARE_PROVIDER_SITE_OTHER): Payer: Medicare HMO | Admitting: Internal Medicine

## 2019-07-14 ENCOUNTER — Other Ambulatory Visit: Payer: Self-pay

## 2019-07-14 VITALS — BP 134/86 | HR 82 | Temp 98.3°F | Ht 65.0 in | Wt 148.6 lb

## 2019-07-14 DIAGNOSIS — M791 Myalgia, unspecified site: Secondary | ICD-10-CM

## 2019-07-14 LAB — COMPREHENSIVE METABOLIC PANEL
ALT: 28 U/L (ref 0–35)
AST: 28 U/L (ref 0–37)
Albumin: 4.7 g/dL (ref 3.5–5.2)
Alkaline Phosphatase: 73 U/L (ref 39–117)
BUN: 20 mg/dL (ref 6–23)
CO2: 31 mEq/L (ref 19–32)
Calcium: 9.7 mg/dL (ref 8.4–10.5)
Chloride: 99 mEq/L (ref 96–112)
Creatinine, Ser: 0.95 mg/dL (ref 0.40–1.20)
GFR: 72.1 mL/min (ref >60.00)
Glucose, Bld: 102 mg/dL — ABNORMAL HIGH (ref 70–99)
Potassium: 4.4 mEq/L (ref 3.5–5.1)
Sodium: 136 mEq/L (ref 135–145)
Total Bilirubin: 0.2 mg/dL (ref 0.2–1.2)
Total Protein: 7.9 g/dL (ref 6.0–8.3)

## 2019-07-14 LAB — CK: Total CK: 323 U/L — ABNORMAL HIGH (ref 7–177)

## 2019-07-14 LAB — CBC
HCT: 38.3 % (ref 36.0–46.0)
Hemoglobin: 12.8 g/dL (ref 12.0–15.0)
MCHC: 33.4 g/dL (ref 30.0–36.0)
MCV: 97.8 fl (ref 78.0–100.0)
Platelets: 199 10*3/uL (ref 150.0–400.0)
RBC: 3.91 Mil/uL (ref 3.87–5.11)
RDW: 12.4 % (ref 11.5–15.5)
WBC: 4.8 10*3/uL (ref 4.0–10.5)

## 2019-07-14 NOTE — Patient Instructions (Signed)
We will check the labs today and call you back about the results.   Take the ibuprofen 3 times a day OR the naproxen 2 times a day for 1 week to see if the pain goes away.

## 2019-07-14 NOTE — Assessment & Plan Note (Signed)
Seems to be muscular, checking CK and CBC and CMP to rule out statin as cause. No injury to provoke. Not consistent with claudication or blood clot on history/exam. Try nsaids consistently for 1 week to see if this helps.

## 2019-07-14 NOTE — Progress Notes (Signed)
   Subjective:   Patient ID: Diana Watson, female    DOB: [birth year], 64 y.o.   MRN: [id number]  HPI The patient is a 64 YO female coming in for concerns about bilateral calf pain. Started about 1 week ago. Has been exercising going up and down stairs but this has been going on for 6 months. Denies injury or overuse or falls recently. Denies changes in medications. Pain is stable whether she is sitting or walking or exercising. Does get better with rest at night time after a couple of hours and wakes without pain. Within 2 hours her calves start hurting. Has tried tylenol, ibuprofen and naproxen which all work temporarily. Overall pain is not improving.   Review of Systems  Constitutional: Negative.   HENT: Negative.   Eyes: Negative.   Respiratory: Negative for cough, chest tightness and shortness of breath.   Cardiovascular: Negative for chest pain, palpitations and leg swelling.  Gastrointestinal: Negative for abdominal distention, abdominal pain, constipation, diarrhea, nausea and vomiting.  Musculoskeletal: Positive for myalgias.  Skin: Negative.   Neurological: Negative.   Psychiatric/Behavioral: Negative.     Objective:  Physical Exam Constitutional:      Appearance: She is well-developed.  HENT:     Head: Normocephalic and atraumatic.  Cardiovascular:     Rate and Rhythm: Normal rate and regular rhythm.  Pulmonary:     Effort: Pulmonary effort is normal. No respiratory distress.     Breath sounds: Normal breath sounds. No wheezing or rales.  Abdominal:     General: Bowel sounds are normal. There is no distension.     Palpations: Abdomen is soft.     Tenderness: There is no abdominal tenderness. There is no rebound.  Musculoskeletal:     Cervical back: Normal range of motion.     Comments: No swelling in the legs or calves, no increase in pain to direct palpation  Skin:    General: Skin is warm and dry.  Neurological:     Mental Status: She is alert and oriented  to person, place, and time.     Coordination: Coordination normal.     Vitals:   07/14/19 1056  BP: 134/86  Pulse: 82  Temp: 98.3 F (36.8 C)  TempSrc: Oral  SpO2: 99%  Weight: 148 lb 9.6 oz (67.4 kg)  Height: 5\' 5"  (1.651 m)    This visit occurred during the SARS-CoV-2 public health emergency.  Safety protocols were in place, including screening questions prior to the visit, additional usage of staff PPE, and extensive cleaning of exam room while observing appropriate contact time as indicated for disinfecting solutions.   Assessment & Plan:

## 2019-07-20 ENCOUNTER — Encounter: Payer: Self-pay | Admitting: Family Medicine

## 2019-07-20 ENCOUNTER — Other Ambulatory Visit: Payer: Self-pay

## 2019-07-20 ENCOUNTER — Ambulatory Visit (INDEPENDENT_AMBULATORY_CARE_PROVIDER_SITE_OTHER): Payer: Medicare HMO | Admitting: Family Medicine

## 2019-07-20 VITALS — BP 130/80 | HR 84 | Ht 65.0 in | Wt 145.0 lb

## 2019-07-20 DIAGNOSIS — M79661 Pain in right lower leg: Secondary | ICD-10-CM | POA: Diagnosis not present

## 2019-07-20 DIAGNOSIS — R748 Abnormal levels of other serum enzymes: Secondary | ICD-10-CM

## 2019-07-20 DIAGNOSIS — M79662 Pain in left lower leg: Secondary | ICD-10-CM

## 2019-07-20 DIAGNOSIS — M791 Myalgia, unspecified site: Secondary | ICD-10-CM

## 2019-07-20 NOTE — Patient Instructions (Addendum)
Thank you for coming in today. I like your exercise.  Try doing the stretches. Go slow and go from up to down in about 3-4 seconds.  10-30 1-2x daily typically after exercise.  We can follow up that mild elevated CK level if we need to.

## 2019-07-20 NOTE — Progress Notes (Signed)
Subjective:   I, Debbe Odea, am serving as a scribe for Dr. Clementeen Graham.  I'm seeing this patient as a consultation for:  Hillard Danker, MD. Note will be routed back to referring provider/PCP.  CC: Bilateral calf pain   HPI: Patient is a 64 year old female who presents today with bilateral calf pain X1 week denies any even that has caused pain. Patient rates pain 0/10 today but it was 8/10 at its worse and describes pain as a strained feeling .Patient states that she used to do leg reaches for stretching and once she stopped that the pain went away. Patient's CK labs were up just a little.  She never had any pain with exercise.  Radiates: no  Tried: tylenol, ibuprofen, and naproxen Tried: stopped the leg stretches and has continued exercises up and down steps    Past medical history, Surgical history, Family history, Social history, Allergies, and medications have been entered into the medical record, reviewed.   Review of Systems: No new headache, visual changes, nausea, vomiting, diarrhea, constipation, dizziness, abdominal pain, skin rash, fevers, chills, night sweats, weight loss, swollen lymph nodes, body aches, joint swelling, muscle aches, chest pain, shortness of breath, mood changes, visual or auditory hallucinations.   Objective:    Vitals:   07/20/19 0922  BP: 130/80  Pulse: 84  SpO2: 99%   General: Well Developed, well nourished, and in no acute distress.  Neuro/Psych: Alert and oriented x3, extra-ocular muscles intact, able to move all 4 extremities, sensation grossly intact. Skin: Warm and dry, no rashes noted.  Respiratory: Not using accessory muscles, speaking in full sentences, trachea midline.  Cardiovascular: Pulses palpable, no extremity edema. Abdomen: Does not appear distended. MSK: Calves bilaterally normal-appearing nontender normal motion.  Lab and Radiology Results Recent Results (from the past 2160 hour(s))  POCT glycosylated hemoglobin (Hb  A1C)     Status: Normal   Collection Time: 05/14/19  8:02 AM  Result Value Ref Range   Hemoglobin A1C 5.5 4.0 - 5.6 %   HbA1c POC (<> result, manual entry)     HbA1c, POC (prediabetic range)     HbA1c, POC (controlled diabetic range)    CBC     Status: None   Collection Time: 07/14/19 11:22 AM  Result Value Ref Range   WBC 4.8 4.0 - 10.5 K/uL   RBC 3.91 3.87 - 5.11 Mil/uL   Platelets 199.0 150.0 - 400.0 K/uL   Hemoglobin 12.8 12.0 - 15.0 g/dL   HCT 40.9 81.1 - 91.4 %   MCV 97.8 78.0 - 100.0 fl   MCHC 33.4 30.0 - 36.0 g/dL   RDW 78.2 95.6 - 21.3 %  Comprehensive metabolic panel     Status: Abnormal   Collection Time: 07/14/19 11:22 AM  Result Value Ref Range   Sodium 136 135 - 145 mEq/L   Potassium 4.4 3.5 - 5.1 mEq/L   Chloride 99 96 - 112 mEq/L   CO2 31 19 - 32 mEq/L   Glucose, Bld 102 (H) 70 - 99 mg/dL   BUN 20 6 - 23 mg/dL   Creatinine, Ser 0.86 0.40 - 1.20 mg/dL   Total Bilirubin 0.2 0.2 - 1.2 mg/dL   Alkaline Phosphatase 73 39 - 117 U/L   AST 28 0 - 37 U/L   ALT 28 0 - 35 U/L   Total Protein 7.9 6.0 - 8.3 g/dL   Albumin 4.7 3.5 - 5.2 g/dL   GFR 57.84 >69.62 mL/min   Calcium  9.7 8.4 - 10.5 mg/dL  CK (Creatine Kinase)     Status: Abnormal   Collection Time: 07/14/19 11:22 AM  Result Value Ref Range   Total CK 323 (H) 7 - 177 U/L     Impression and Recommendations:    Assessment and Plan: 63 y.o. female with bilateral calf pain.  Pain spontaneously improved when she stopped doing the stretches that she was doing after exercise.  I think she was over stretching the muscles which caused the pain.  The only abnormal lab finding of any significance obtained by PCP is mildly elevated CK at 323.  As she does not have any pain now I do not see the utility of further work-up.  Plan to show her some modification of stretching activities that she can do that are probably going to result in less pain.  Recommend continue exercise as well she tolerates it.  Recheck with me as  needed.  Precautions reviewed..    Discussed warning signs or symptoms. Please see discharge instructions. Patient expresses understanding.   The above documentation has been reviewed and is accurate and complete Lynne Leader

## 2019-08-27 ENCOUNTER — Other Ambulatory Visit: Payer: Self-pay | Admitting: Internal Medicine

## 2019-09-08 ENCOUNTER — Telehealth: Payer: Self-pay | Admitting: Internal Medicine

## 2019-09-08 ENCOUNTER — Encounter: Payer: Self-pay | Admitting: Internal Medicine

## 2019-09-08 ENCOUNTER — Other Ambulatory Visit: Payer: Self-pay

## 2019-09-08 ENCOUNTER — Ambulatory Visit (INDEPENDENT_AMBULATORY_CARE_PROVIDER_SITE_OTHER): Payer: Medicare HMO | Admitting: Internal Medicine

## 2019-09-08 VITALS — BP 126/78 | HR 79 | Temp 98.6°F | Ht 65.0 in | Wt 144.0 lb

## 2019-09-08 DIAGNOSIS — E1169 Type 2 diabetes mellitus with other specified complication: Secondary | ICD-10-CM

## 2019-09-08 DIAGNOSIS — E785 Hyperlipidemia, unspecified: Secondary | ICD-10-CM | POA: Diagnosis not present

## 2019-09-08 DIAGNOSIS — I10 Essential (primary) hypertension: Secondary | ICD-10-CM | POA: Diagnosis not present

## 2019-09-08 LAB — COMPREHENSIVE METABOLIC PANEL
ALT: 29 U/L (ref 0–35)
AST: 32 U/L (ref 0–37)
Albumin: 4.8 g/dL (ref 3.5–5.2)
Alkaline Phosphatase: 62 U/L (ref 39–117)
BUN: 19 mg/dL (ref 6–23)
CO2: 31 mEq/L (ref 19–32)
Calcium: 9.9 mg/dL (ref 8.4–10.5)
Chloride: 99 mEq/L (ref 96–112)
Creatinine, Ser: 1.05 mg/dL (ref 0.40–1.20)
GFR: 64.2 mL/min (ref >60.00)
Glucose, Bld: 92 mg/dL (ref 70–99)
Potassium: 4.5 mEq/L (ref 3.5–5.1)
Sodium: 135 mEq/L (ref 135–145)
Total Bilirubin: 0.3 mg/dL (ref 0.2–1.2)
Total Protein: 8 g/dL (ref 6.0–8.3)

## 2019-09-08 LAB — CBC
HCT: 36.1 % (ref 36.0–46.0)
Hemoglobin: 12.2 g/dL (ref 12.0–15.0)
MCHC: 33.9 g/dL (ref 30.0–36.0)
MCV: 97.8 fl (ref 78.0–100.0)
Platelets: 186 10*3/uL (ref 150.0–400.0)
RBC: 3.69 Mil/uL — ABNORMAL LOW (ref 3.87–5.11)
RDW: 12.6 % (ref 11.5–15.5)
WBC: 5.2 10*3/uL (ref 4.0–10.5)

## 2019-09-08 LAB — LIPID PANEL
Cholesterol: 141 mg/dL (ref 0–200)
HDL: 68.6 mg/dL (ref >39.00)
LDL Cholesterol: 62 mg/dL (ref 0–99)
NonHDL: 71.92
Total CHOL/HDL Ratio: 2
Triglycerides: 52 mg/dL (ref 0.0–149.0)
VLDL: 10.4 mg/dL (ref 0.0–40.0)

## 2019-09-08 LAB — HEMOGLOBIN A1C: Hgb A1c MFr Bld: 6 % (ref 4.6–6.5)

## 2019-09-08 NOTE — Telephone Encounter (Signed)
New message:   Pt is calling to give [place] her vaccination information. She states she had her first dosage on 09/01/19 @ [facility] and it was the [place]. She states she goes for her second dosage on 09/29/19 and will go back to [facility].

## 2019-09-08 NOTE — Assessment & Plan Note (Signed)
BP at goal on lisinopril 30 mg daily and checking CMP today and adjust as needed.

## 2019-09-08 NOTE — Assessment & Plan Note (Signed)
Checking HgA1c and adjust as needed. Diet controlled currently. On ACE-I and statin.

## 2019-09-08 NOTE — Progress Notes (Signed)
   Subjective:   Patient ID: Diana Watson, female    DOB: December 04, 1955, 64 y.o.   MRN: [id number]  HPI The patient is a 64 YO female coming in for follow up diabetes (controlled with diet currently, likely related to her mental health medications originally, admits to some dietary changes in May and is worried that numbers are up, denies numbness or tingling in feet/hands, denies increase in thirst or urination) and cholesterol (taking crestor 5 mg daily, denies side effects, denies chest pains or stroke symptoms), and blood pressure (taking lisinopril daily, denies side effects, denies headaches or chest pains, exercising most days).   Review of Systems  Constitutional: Negative.   HENT: Negative.   Eyes: Negative.   Respiratory: Negative for cough, chest tightness and shortness of breath.   Cardiovascular: Negative for chest pain, palpitations and leg swelling.  Gastrointestinal: Negative for abdominal distention, abdominal pain, constipation, diarrhea, nausea and vomiting.  Musculoskeletal: Negative.   Skin: Negative.   Neurological: Negative.   Psychiatric/Behavioral: Negative.     Objective:  Physical Exam Constitutional:      Appearance: She is well-developed.  HENT:     Head: Normocephalic and atraumatic.  Cardiovascular:     Rate and Rhythm: Normal rate and regular rhythm.  Pulmonary:     Effort: Pulmonary effort is normal. No respiratory distress.     Breath sounds: Normal breath sounds. No wheezing or rales.  Abdominal:     General: Bowel sounds are normal. There is no distension.     Palpations: Abdomen is soft.     Tenderness: There is no abdominal tenderness. There is no rebound.  Musculoskeletal:     Cervical back: Normal range of motion.  Skin:    General: Skin is warm and dry.  Neurological:     Mental Status: She is alert and oriented to person, place, and time.     Coordination: Coordination normal.     Vitals:   09/08/19 0800  BP: 126/78  Pulse:  79  Temp: 98.6 F (37 C)  TempSrc: Oral  SpO2: 99%  Weight: 144 lb (65.3 kg)  Height: 5\' 5"  (1.651 m)    This visit occurred during the SARS-CoV-2 public health emergency.  Safety protocols were in place, including screening questions prior to the visit, additional usage of staff PPE, and extensive cleaning of exam room while observing appropriate contact time as indicated for disinfecting solutions.   Assessment & Plan:

## 2019-09-08 NOTE — Telephone Encounter (Signed)
First shot has been added to vaccination history. Second shot will be recorded once she has received it.

## 2019-09-08 NOTE — Assessment & Plan Note (Signed)
Checking lipid panel and adjust crestor 5 mg daily as needed for goal LDL <70.

## 2019-09-10 ENCOUNTER — Telehealth: Payer: Self-pay | Admitting: Internal Medicine

## 2019-09-10 NOTE — Telephone Encounter (Signed)
Called pt, LVM.   

## 2019-09-10 NOTE — Telephone Encounter (Signed)
Please return  call to discuss lab results 

## 2019-09-11 ENCOUNTER — Telehealth: Payer: Self-pay | Admitting: Internal Medicine

## 2019-09-11 NOTE — Telephone Encounter (Signed)
New message: ° ° ° °Pt is returning a call for lab results. Please advise. °

## 2019-09-11 NOTE — Telephone Encounter (Signed)
Pt informed of lab results. See labs. 

## 2019-09-16 ENCOUNTER — Telehealth: Payer: Self-pay | Admitting: Internal Medicine

## 2019-09-16 DIAGNOSIS — F0390 Unspecified dementia without behavioral disturbance: Secondary | ICD-10-CM

## 2019-09-16 NOTE — Telephone Encounter (Signed)
New Message:   Pt is calling and states that she is needing a referral to go back to see a Neurologist Dr. Patrcia Dolly. She states she was told before if her condition ever gets worse to come back. Please advise.

## 2019-09-17 NOTE — Telephone Encounter (Signed)
Referral placed.

## 2019-09-17 NOTE — Telephone Encounter (Signed)
We do need to know what condition she is requesting to be referred for.

## 2019-09-17 NOTE — Telephone Encounter (Signed)
Called pt, LVM. Needing clarification

## 2019-09-17 NOTE — Addendum Note (Signed)
Addended by: Hillard Danker A on: 09/17/2019 01:59 PM   Modules accepted: Orders

## 2019-09-17 NOTE — Telephone Encounter (Signed)
New Message:   Pt is returning call and states she is needing a referral for major neuro cognitive disorder. She states she was diagnosed back in 2018. She states the condition has gotten worse. Please advise.

## 2019-09-28 DIAGNOSIS — F314 Bipolar disorder, current episode depressed, severe, without psychotic features: Secondary | ICD-10-CM | POA: Diagnosis not present

## 2019-10-06 ENCOUNTER — Ambulatory Visit (INDEPENDENT_AMBULATORY_CARE_PROVIDER_SITE_OTHER): Payer: Medicare HMO | Admitting: Internal Medicine

## 2019-10-06 ENCOUNTER — Encounter: Payer: Self-pay | Admitting: Internal Medicine

## 2019-10-06 ENCOUNTER — Other Ambulatory Visit: Payer: Self-pay

## 2019-10-06 ENCOUNTER — Ambulatory Visit: Payer: Medicare HMO | Admitting: Internal Medicine

## 2019-10-06 VITALS — BP 126/82 | HR 78 | Temp 98.0°F | Ht 65.0 in | Wt 137.0 lb

## 2019-10-06 DIAGNOSIS — E1169 Type 2 diabetes mellitus with other specified complication: Secondary | ICD-10-CM

## 2019-10-06 NOTE — Progress Notes (Signed)
   Subjective:   Patient ID: Diana Watson, female    DOB: [birth year], 64 y.o.   MRN: [id number]  HPI The patient is a 64 YO female coming in for concerns about her blood sugar. She had a fasting reading at home around 108 a couple times. Then she had a post-meal around 75 but then exercised and this increased to 104 which concerned her. She was also disappointed with her most recent HgA1c 6.0. She did have dietary lapses in May to explain part of this. Denies increase in thirst or urination. No readings over 120.    Review of Systems  Constitutional: Negative.   HENT: Negative.   Eyes: Negative.   Respiratory: Negative for cough, chest tightness and shortness of breath.   Cardiovascular: Negative for chest pain, palpitations and leg swelling.  Gastrointestinal: Negative for abdominal distention, abdominal pain, constipation, diarrhea, nausea and vomiting.  Musculoskeletal: Negative.   Skin: Negative.   Neurological: Negative.   Psychiatric/Behavioral: Negative.     Objective:  Physical Exam Constitutional:      Appearance: She is well-developed.  HENT:     Head: Normocephalic and atraumatic.  Cardiovascular:     Rate and Rhythm: Normal rate and regular rhythm.  Pulmonary:     Effort: Pulmonary effort is normal. No respiratory distress.     Breath sounds: Normal breath sounds. No wheezing or rales.  Abdominal:     General: Bowel sounds are normal. There is no distension.     Palpations: Abdomen is soft.     Tenderness: There is no abdominal tenderness. There is no rebound.  Musculoskeletal:     Cervical back: Normal range of motion.  Skin:    General: Skin is warm and dry.  Neurological:     Mental Status: She is alert and oriented to person, place, and time.     Coordination: Coordination normal.     Vitals:   10/06/19 0945  BP: 126/82  Pulse: 78  Temp: 98 F (36.7 C)  TempSrc: Oral  SpO2: 95%  Weight: 137 lb (62.1 kg)  Height: 5\' 5"  (1.651 m)    This  visit occurred during the SARS-CoV-2 public health emergency.  Safety protocols were in place, including screening questions prior to the visit, additional usage of staff PPE, and extensive cleaning of exam room while observing appropriate contact time as indicated for disinfecting solutions.   Assessment & Plan:

## 2019-10-06 NOTE — Patient Instructions (Signed)
We will set you up to come in for labs in 2-3 weeks to check the fructosamine level which checks the sugars.

## 2019-10-06 NOTE — Assessment & Plan Note (Signed)
Ordered fructosamine to be done in 2-3 weeks to evaluate current level of control of sugars.

## 2019-10-14 ENCOUNTER — Ambulatory Visit (INDEPENDENT_AMBULATORY_CARE_PROVIDER_SITE_OTHER): Payer: Medicare HMO | Admitting: Family Medicine

## 2019-10-14 ENCOUNTER — Other Ambulatory Visit: Payer: Self-pay | Admitting: Internal Medicine

## 2019-10-14 ENCOUNTER — Encounter: Payer: Self-pay | Admitting: Family Medicine

## 2019-10-14 ENCOUNTER — Other Ambulatory Visit: Payer: Self-pay

## 2019-10-14 VITALS — BP 118/74 | HR 87 | Ht 65.0 in | Wt 138.0 lb

## 2019-10-14 DIAGNOSIS — M79661 Pain in right lower leg: Secondary | ICD-10-CM

## 2019-10-14 DIAGNOSIS — M791 Myalgia, unspecified site: Secondary | ICD-10-CM

## 2019-10-14 DIAGNOSIS — R7989 Other specified abnormal findings of blood chemistry: Secondary | ICD-10-CM | POA: Diagnosis not present

## 2019-10-14 DIAGNOSIS — R748 Abnormal levels of other serum enzymes: Secondary | ICD-10-CM | POA: Diagnosis not present

## 2019-10-14 DIAGNOSIS — M79662 Pain in left lower leg: Secondary | ICD-10-CM

## 2019-10-14 MED ORDER — GABAPENTIN 300 MG PO CAPS: 300.0000 mg | ORAL_CAPSULE | Freq: Three times a day (TID) | ORAL | 1 refills | Status: DC | PRN

## 2019-10-14 NOTE — Patient Instructions (Signed)
Thank you for coming in today. Plan for blood flow test.  Also get labs today.  Try gabapentin up to 3x daily for nerve pain. This may help.  Recheck in 1 month.

## 2019-10-14 NOTE — Progress Notes (Signed)
   Wynema Birch, am serving as a Neurosurgeon for Dr. Clementeen Graham.  Diana Watson is a 64 y.o. female who presents to [facility] today for f/u of B calf pain.  She was last seen by Dr. Denyse Amass on 07/20/19 after experiencing pain that seemed to be associated w/ possible over-stretching of her calf as part of an exercise routine.  Pt was advised to focus on eccentric calf strengthening per Dr. Denyse Amass.  Since her last visit, pt reports it does not hurt every day, but did notice pain in her arches as well as her calf has inserts now that give arch support so no arch pain. When the calf pain episodes occur it is 8-8.5/10. ibuprofen, naproxen, and tylenol is not touching the pain when she has those episodes.    Pertinent review of systems: No fevers or chills  Relevant historical information: Diabetes, hypertension, neurocognitive disorder   Exam:  BP 118/74 (BP Location: Left Arm, Patient Position: Sitting, Cuff Size: Normal)   Pulse 87   Ht 5\' 5"  (1.651 m)   Wt 138 lb (62.6 kg)   SpO2 99%   BMI 22.96 kg/m  General: Well Developed, well nourished, and in no acute distress.   MSK: L-spine normal motion nontender lower extremity strength is intact. Calves bilaterally normal-appearing nontender normal motion and strength. Pulses intact feet bilaterally.  Capillary refill and sensation intact distally.    Lab and Radiology Results Lab Results  Component Value Date   CKTOTAL 323 (H) 07/14/2019       Assessment and Plan: 64 y.o. female with bilateral calf pain intermittent.  Pain is severe at times.  Etiology is somewhat unclear.  Plan to evaluate for general myopathy.  CK was elevated mildly previously in April and TSH was low previously.  Will check CK again and TSH and T4.  Additionally will order ABI to test for possible claudication. Although I think radiculopathy is less likely will go ahead and start gabapentin for potential radiculopathy pain.   If beneficial may proceed with further evaluation.  Recheck back in a month.   PDMP not reviewed this encounter. Orders Placed This Encounter  Procedures  . Thyroid Panel With TSH    Standing Status:   Future    Number of Occurrences:   1    Standing Expiration Date:   10/13/2020  . CK    Standing Status:   Future    Number of Occurrences:   1    Standing Expiration Date:   10/13/2020  . Sedimentation rate    Standing Status:   Future    Number of Occurrences:   1    Standing Expiration Date:   10/13/2020   Meds ordered this encounter  Medications  . gabapentin (NEURONTIN) 300 MG capsule    Sig: Take 1 capsule (300 mg total) by mouth 3 (three) times daily as needed (nerve or calf pain).    Dispense:  90 capsule    Refill:  1     Discussed warning signs or symptoms. Please see discharge instructions. Patient expresses understanding.   The above documentation has been reviewed and is accurate and complete 10/15/2020, M.D.

## 2019-10-15 LAB — THYROID PANEL WITH TSH
Free Thyroxine Index: 1.9 (ref 1.4–3.8)
T3 Uptake: 27 % (ref 22–35)
T4, Total: 6.9 ug/dL (ref 5.1–11.9)
TSH: 1.69 mIU/L (ref 0.40–4.50)

## 2019-10-15 LAB — CK: Total CK: 172 U/L — ABNORMAL HIGH (ref 29–143)

## 2019-10-15 LAB — SEDIMENTATION RATE: Sed Rate: 17 mm/h (ref 0–30)

## 2019-10-15 NOTE — Progress Notes (Signed)
Thyroid labs look normal.Sedimentation rate a marker of inflammation looks normal.CK a marker of muscle inflammation looks normal

## 2019-10-27 ENCOUNTER — Encounter: Payer: Self-pay | Admitting: Internal Medicine

## 2019-10-27 ENCOUNTER — Other Ambulatory Visit: Payer: Self-pay

## 2019-10-27 ENCOUNTER — Ambulatory Visit (INDEPENDENT_AMBULATORY_CARE_PROVIDER_SITE_OTHER): Payer: Medicare HMO | Admitting: Internal Medicine

## 2019-10-27 ENCOUNTER — Other Ambulatory Visit: Payer: Medicare HMO

## 2019-10-27 DIAGNOSIS — E1169 Type 2 diabetes mellitus with other specified complication: Secondary | ICD-10-CM

## 2019-10-27 NOTE — Progress Notes (Signed)
   Subjective:   Patient ID: Diana Watson, female    DOB: 09/09/1955, 64 y.o.   MRN: [id number]  HPI The patient is a 64 YO female with some questions about her sugars and her diabetes. She is monitoring sugars and most recently diet controlled last HgA1c 6.0. She is at increased risk of this due to her long term mental health medications. She is checking sugars after meals and then after 10 minutes of exercise and is concerned as some of the readings go up after exercise which she would not have thought should happen.   Review of Systems  Constitutional: Negative.   HENT: Negative.   Eyes: Negative.   Respiratory: Negative for cough, chest tightness and shortness of breath.   Cardiovascular: Negative for chest pain, palpitations and leg swelling.  Gastrointestinal: Negative for abdominal distention, abdominal pain, constipation, diarrhea, nausea and vomiting.  Musculoskeletal: Negative.   Skin: Negative.   Neurological: Negative.   Psychiatric/Behavioral: Negative.     Objective:  Physical Exam Constitutional:      Appearance: She is well-developed.  HENT:     Head: Normocephalic and atraumatic.  Cardiovascular:     Rate and Rhythm: Normal rate and regular rhythm.  Pulmonary:     Effort: Pulmonary effort is normal. No respiratory distress.     Breath sounds: Normal breath sounds. No wheezing or rales.  Abdominal:     General: Bowel sounds are normal. There is no distension.     Palpations: Abdomen is soft.     Tenderness: There is no abdominal tenderness. There is no rebound.  Musculoskeletal:     Cervical back: Normal range of motion.  Skin:    General: Skin is warm and dry.  Neurological:     Mental Status: She is alert and oriented to person, place, and time.     Coordination: Coordination normal.     Vitals:   10/27/19 0901  BP: 124/78  Pulse: 83  Temp: 98.7 F (37.1 C)  TempSrc: Oral  SpO2: 97%  Weight: 133 lb (60.3 kg)  Height: 5\' 5"  (1.651 m)     This visit occurred during the SARS-CoV-2 public health emergency.  Safety protocols were in place, including screening questions prior to the visit, additional usage of staff PPE, and extensive cleaning of exam room while observing appropriate contact time as indicated for disinfecting solutions.   Assessment & Plan:  Visit time 15 minutes in face to face communication with patient and coordination of care, additional 5 minutes spent in record review, coordination or care, ordering tests, communicating/referring to other healthcare professionals, documenting in medical records all on the same day of the visit for total time 20 minutes spent on the visit.

## 2019-10-28 ENCOUNTER — Ambulatory Visit: Payer: Self-pay

## 2019-10-28 ENCOUNTER — Encounter: Payer: Self-pay | Admitting: Internal Medicine

## 2019-10-28 NOTE — Assessment & Plan Note (Signed)
Checking fructosamine today for updated control level. Explained glucose metabolism and liberation with exercise which can initially look like higher sugar levels but should actually go down with time after exercise.

## 2019-10-31 LAB — FRUCTOSAMINE: Fructosamine: 301 umol/L — ABNORMAL HIGH (ref 205–285)

## 2019-11-02 ENCOUNTER — Telehealth: Payer: Self-pay | Admitting: Internal Medicine

## 2019-11-02 NOTE — Telephone Encounter (Signed)
Please return  call to discuss lab results 

## 2019-11-13 ENCOUNTER — Ambulatory Visit: Payer: Medicare HMO | Admitting: Family Medicine

## 2019-11-17 ENCOUNTER — Other Ambulatory Visit: Payer: Self-pay | Admitting: Family Medicine

## 2019-11-17 ENCOUNTER — Ambulatory Visit: Payer: Medicare HMO | Admitting: Neurology

## 2019-11-17 DIAGNOSIS — I739 Peripheral vascular disease, unspecified: Secondary | ICD-10-CM

## 2019-11-17 DIAGNOSIS — R748 Abnormal levels of other serum enzymes: Secondary | ICD-10-CM

## 2019-11-17 DIAGNOSIS — M79661 Pain in right lower leg: Secondary | ICD-10-CM

## 2019-11-17 DIAGNOSIS — M791 Myalgia, unspecified site: Secondary | ICD-10-CM

## 2019-11-17 DIAGNOSIS — M79662 Pain in left lower leg: Secondary | ICD-10-CM

## 2019-11-18 ENCOUNTER — Ambulatory Visit (HOSPITAL_COMMUNITY)
Admission: RE | Admit: 2019-11-18 | Discharge: 2019-11-18 | Disposition: A | Discharge status: Home / Self Care | Payer: Medicare HMO | Source: Ambulatory Visit | Attending: Cardiology | Admitting: Cardiology

## 2019-11-18 ENCOUNTER — Other Ambulatory Visit: Payer: Self-pay

## 2019-11-18 DIAGNOSIS — M791 Myalgia, unspecified site: Secondary | ICD-10-CM | POA: Insufficient documentation

## 2019-11-18 DIAGNOSIS — I739 Peripheral vascular disease, unspecified: Secondary | ICD-10-CM | POA: Diagnosis not present

## 2019-11-18 DIAGNOSIS — M79661 Pain in right lower leg: Secondary | ICD-10-CM | POA: Insufficient documentation

## 2019-11-18 DIAGNOSIS — R748 Abnormal levels of other serum enzymes: Secondary | ICD-10-CM | POA: Insufficient documentation

## 2019-11-18 DIAGNOSIS — M79662 Pain in left lower leg: Secondary | ICD-10-CM | POA: Insufficient documentation

## 2019-11-18 NOTE — Progress Notes (Signed)
Blood flow test is normal

## 2019-11-19 ENCOUNTER — Ambulatory Visit (INDEPENDENT_AMBULATORY_CARE_PROVIDER_SITE_OTHER): Payer: Medicare HMO

## 2019-11-19 DIAGNOSIS — Z0000 Encounter for general adult medical examination without abnormal findings: Secondary | ICD-10-CM

## 2019-11-19 NOTE — Patient Instructions (Addendum)
Diana Watson , Thank you for taking time to come for your Medicare Wellness Visit. I appreciate your ongoing commitment to your health goals. Please review the following plan we discussed and let me know if I can assist you in the future.   Screening recommendations/referrals: Colonoscopy: last done 06/06/2016; due every 10 years ([facility]-Dr.Jacobs [phone]) Mammogram: last done 01/21/2019; due every year Bone Density: never ordered Recommended yearly ophthalmology/optometry visit for glaucoma screening and checkup (Dr. Rutherford Guys) Recommended yearly dental visit for hygiene and checkup  Vaccinations: Influenza vaccine: last done 12/26/2018; due every Fall Pneumococcal vaccine: not completed; Pneumovax 23 done 01/05/2016; need Prevnar 13 at next office visit Tdap vaccine: last done 01/06/2012; due every 10 years (due 2023) Shingles vaccine: completed at [place]: Moderna completed  Advanced directives: Advance directive discussed with you today. I have provided a copy for you to complete at home and have notarized. Once this is complete please bring a copy in to our office so we can scan it into your chart.  Conditions/risks identified: Yes; Please continue to do your personal lifestyle choices by: daily care of teeth and gums, regular physical activity (goal should be 5 days a week for 30 minutes), eat a healthy diet, avoid tobacco and drug use, limiting any alcohol intake, taking a low-dose aspirin (if not allergic or have been advised by your provider otherwise) and taking vitamins and minerals as recommended by your provider. Continue doing brain stimulating activities (puzzles, reading, adult coloring books, staying active) to keep memory sharp. Continue to eat heart healthy diet (full of fruits, vegetables, whole grains, lean protein, water--limit salt, fat, and sugar intake) and increase physical activity as tolerated.  Next appointment: Please schedule your next  Medicare Wellness Visit with your Nurse Health Advisor in 1 year.  Preventive Care 40-64 Years, Female Preventive care refers to lifestyle choices and visits with your health care provider that can promote health and wellness. What does preventive care include?  A yearly physical exam. This is also called an annual well check.  Dental exams once or twice a year.  Routine eye exams. Ask your health care provider how often you should have your eyes checked.  Personal lifestyle choices, including:  Daily care of your teeth and gums.  Regular physical activity.  Eating a healthy diet.  Avoiding tobacco and drug use.  Limiting alcohol use.  Practicing safe sex.  Taking low-dose aspirin daily starting at age 67.  Taking vitamin and mineral supplements as recommended by your health care provider. What happens during an annual well check? The services and screenings done by your health care provider during your annual well check will depend on your age, overall health, lifestyle risk factors, and family history of disease. Counseling  Your health care provider may ask you questions about your:  Alcohol use.  Tobacco use.  Drug use.  Emotional well-being.  Home and relationship well-being.  Sexual activity.  Eating habits.  Work and work Statistician.  Method of birth control.  Menstrual cycle.  Pregnancy history. Screening  You may have the following tests or measurements:  Height, weight, and BMI.  Blood pressure.  Lipid and cholesterol levels. These may be checked every 5 years, or more frequently if you are over 58 years old.  Skin check.  Lung cancer screening. You may have this screening every year starting at age 29 if you have a 30-pack-year history of smoking and currently smoke or have quit within the past 15 years.  Fecal occult blood test (FOBT) of the stool. You may have this test every year starting at age 79.  Flexible sigmoidoscopy or  colonoscopy. You may have a sigmoidoscopy every 5 years or a colonoscopy every 10 years starting at age 98.  Hepatitis C blood test.  Hepatitis B blood test.  Sexually transmitted disease (STD) testing.  Diabetes screening. This is done by checking your blood sugar (glucose) after you have not eaten for a while (fasting). You may have this done every 1-3 years.  Mammogram. This may be done every 1-2 years. Talk to your health care provider about when you should start having regular mammograms. This may depend on whether you have a family history of breast cancer.  BRCA-related cancer screening. This may be done if you have a family history of breast, ovarian, tubal, or peritoneal cancers.  Pelvic exam and Pap test. This may be done every 3 years starting at age 12. Starting at age 58, this may be done every 5 years if you have a Pap test in combination with an HPV test.  Bone density scan. This is done to screen for osteoporosis. You may have this scan if you are at high risk for osteoporosis. Discuss your test results, treatment options, and if necessary, the need for more tests with your health care provider. Vaccines  Your health care provider may recommend certain vaccines, such as:  Influenza vaccine. This is recommended every year.  Tetanus, diphtheria, and acellular pertussis (Tdap, Td) vaccine. You may need a Td booster every 10 years.  Zoster vaccine. You may need this after age 72.  Pneumococcal 13-valent conjugate (PCV13) vaccine. You may need this if you have certain conditions and were not previously vaccinated.  Pneumococcal polysaccharide (PPSV23) vaccine. You may need one or two doses if you smoke cigarettes or if you have certain conditions. Talk to your health care provider about which screenings and vaccines you need and how often you need them. This information is not intended to replace advice given to you by your health care provider. Make sure you discuss any  questions you have with your health care provider. Document Released: 04/15/2015 Document Revised: 12/07/2015 Document Reviewed: 01/18/2015 Elsevier Interactive Patient Education  2017 [place] Prevention in the Home Falls can cause injuries. They can happen to people of all ages. There are many things you can do to make your home safe and to help prevent falls. What can I do on the outside of my home?  Regularly fix the edges of walkways and driveways and fix any cracks.  Remove anything that might make you trip as you walk through a door, such as a raised step or threshold.  Trim any bushes or trees on the path to your home.  Use bright outdoor lighting.  Clear any walking paths of anything that might make someone trip, such as rocks or tools.  Regularly check to see if handrails are loose or broken. Make sure that both sides of any steps have handrails.  Any raised decks and porches should have guardrails on the edges.  Have any leaves, snow, or ice cleared regularly.  Use sand or salt on walking paths during winter.  Clean up any spills in your garage right away. This includes oil or grease spills. What can I do in the bathroom?  Use night lights.  Install grab bars by the toilet and in the tub and shower. Do not use towel bars as grab bars.  Use non-skid mats or decals in the tub or shower.  If you need to sit down in the shower, use a plastic, non-slip stool.  Keep the floor dry. Clean up any water that spills on the floor as soon as it happens.  Remove soap buildup in the tub or shower regularly.  Attach bath mats securely with double-sided non-slip rug tape.  Do not have throw rugs and other things on the floor that can make you trip. What can I do in the bedroom?  Use night lights.  Make sure that you have a light by your bed that is easy to reach.  Do not use any sheets or blankets that are too big for your bed. They should not hang down  onto the floor.  Have a firm chair that has side arms. You can use this for support while you get dressed.  Do not have throw rugs and other things on the floor that can make you trip. What can I do in the kitchen?  Clean up any spills right away.  Avoid walking on wet floors.  Keep items that you use a lot in easy-to-reach places.  If you need to reach something above you, use a strong step stool that has a grab bar.  Keep electrical cords out of the way.  Do not use floor polish or wax that makes floors slippery. If you must use wax, use non-skid floor wax.  Do not have throw rugs and other things on the floor that can make you trip. What can I do with my stairs?  Do not leave any items on the stairs.  Make sure that there are handrails on both sides of the stairs and use them. Fix handrails that are broken or loose. Make sure that handrails are as long as the stairways.  Check any carpeting to make sure that it is firmly attached to the stairs. Fix any carpet that is loose or worn.  Avoid having throw rugs at the top or bottom of the stairs. If you do have throw rugs, attach them to the floor with carpet tape.  Make sure that you have a light switch at the top of the stairs and the bottom of the stairs. If you do not have them, ask someone to add them for you. What else can I do to help prevent falls?  Wear shoes that:  Do not have high heels.  Have rubber bottoms.  Are comfortable and fit you well.  Are closed at the toe. Do not wear sandals.  If you use a stepladder:  Make sure that it is fully opened. Do not climb a closed stepladder.  Make sure that both sides of the stepladder are locked into place.  Ask someone to hold it for you, if possible.  Clearly mark and make sure that you can see:  Any grab bars or handrails.  First and last steps.  Where the edge of each step is.  Use tools that help you move around (mobility aids) if they are needed. These  include:  Canes.  Walkers.  Scooters.  Crutches.  Turn on the lights when you go into a dark area. Replace any light bulbs as soon as they burn out.  Set up your furniture so you have a clear path. Avoid moving your furniture around.  If any of your floors are uneven, fix them.  If there are any pets around you, be aware of where they are.  Review your medicines  with your doctor. Some medicines can make you feel dizzy. This can increase your chance of falling. Ask your doctor what other things that you can do to help prevent falls. This information is not intended to replace advice given to you by your health care provider. Make sure you discuss any questions you have with your health care provider. Document Released: 01/13/2009 Document Revised: 08/25/2015 Document Reviewed: 04/23/2014 Elsevier Interactive Patient Education  2017 [employer].

## 2019-11-19 NOTE — Progress Notes (Addendum)
I connected with Diana Watson today by telephone and verified that I am speaking with the correct person using two identifiers. Location patient: home Location provider: work Persons participating in the virtual visit: Diana Watson and [employer] Tim[employer] CompanyShenika N. Ludmila Ebarb, LPN   I discussed the limitations, risks, security and privacy concerns of performing an evaluation and management service by telephone and the availability of in person appointments. I also discussed with the patient that there may be a patient responsible charge related to this service. The patient expressed understanding and verbally consented to this telephonic visit.    Interactive audio and video telecommunications were attempted between this provider and patient, however failed, due to patient having technical difficulties OR patient did not have access to video capability.  We continued and completed visit with audio only.  Some vital signs may be absent or patient reported.   Time Spent with patient on telephone encounter: 30 minutes  Subjective:   Diana Watson is a 64 y.o. female who presents for Medicare Annual (Subsequent) preventive examination.  Review of Systems    No ROS. Medicare Wellness Virtual Visit. Cardiac Risk Factors include: diabetes mellitus;dyslipidemia;family history of premature cardiovascular disease;hypertension     Objective:    There were no vitals filed for this visit. There is no height or weight on file to calculate BMI.  Advanced Directives 11/19/2019 10/13/2018 08/23/2017 05/23/2016 06/09/2014 04/29/2011  Does Patient Have a Medical Advance Directive? No No No No No Patient does not have advance directive  Would patient like information on creating a medical advance directive? Yes (MAU/Ambulatory/Procedural Areas - Information given) Yes (ED - Information included in AVS) Yes (ED - Information included in AVS) - No - patient declined information -  Pre-existing out of facility  DNR order (yellow form or pink MOST form) - - - - - No  Some encounter information is confidential and restricted. Go to Review Flowsheets activity to see all data.    Current Medications (verified) Outpatient Encounter Medications as of 11/19/2019  Medication Sig   ACCU-CHEK AVIVA PLUS test strip CHECK BLOOD SUGAR TWICE DAILY   Alcohol Swabs PADS Use daily   amitriptyline (ELAVIL) 25 MG tablet    aspirin 81 MG tablet Take 81 mg by mouth daily.   buPROPion (WELLBUTRIN XL) 300 MG 24 hr tablet Take 300 mg by mouth daily.   busPIRone (BUSPAR) 5 MG tablet Take 5 mg by mouth 2 (two) times daily.   Calcium Citrate-Vitamin D (CALCIUM CITRATE + PO) Take by mouth.   DOCUSATE SODIUM PO Take 400 mg by mouth.    gabapentin (NEURONTIN) 300 MG capsule Take 1 capsule (300 mg total) by mouth 3 (three) times daily as needed (nerve or calf pain).   lactulose (CHRONULAC) 10 GM/15ML solution TAKE 15 ML DAILY AS NEEDED FOR MILD CONSTIPATION.   lamoTRIgine (LAMICTAL) 200 MG tablet Take 200 mg by mouth every morning. For mood control   lisinopril (ZESTRIL) 30 MG tablet TAKE 2 TABLETS EVERY DAY   loperamide (IMODIUM) 2 MG capsule Take 6 mg by mouth as needed for diarrhea or loose stools.   Multiple Vitamin (MULTIVITAMIN WITH MINERALS) TABS Take 1 tablet by mouth every morning.    naproxen (NAPROSYN) 500 MG tablet Take 500 mg by mouth 3 (three) times daily as needed. For pain   rosuvastatin (CRESTOR) 5 MG tablet TAKE 1 TABLET EVERY DAY   ziprasidone (GEODON) 80 MG capsule Take 160 mg by mouth at bedtime.    No facility-administered encounter medications on  file as of 11/19/2019.    Allergies (verified) Prednisone   History: Past Medical History:  Diagnosis Date   Bipolar 1 disorder (HCC)    Constipation    Depression    Diabetes mellitus    Herniated disc    Hyperlipidemia    Hypertension    Neurocognitive disorder    major neurocognitive disorder   Personality disorder [facility])    Past Surgical  History:  Procedure Laterality Date   DILATION AND CURETTAGE OF UTERUS     RIGHT OOPHORECTOMY     Also removed a tumor at that time.   Family History  Problem Relation Age of Onset   Diabetes type II Mother    Hypertension Mother    OCD Mother    Bipolar disorder Mother    Diabetes type II Father    Hypertension Father    Bipolar disorder Cousin    Colon cancer Neg Hx    Social History   Socioeconomic History   Marital status: Married    Spouse name: Not on file   Number of children: Not on file   Years of education: Not on file   Highest education level: Not on file  Occupational History   Occupation: disability  Tobacco Use   Smoking status: Never Smoker   Smokeless tobacco: Never Used  Building services engineer Use: Never used  Substance and Sexual Activity   Alcohol use: No    Alcohol/week: 6.0 - 12.0 standard drinks    Types: 6 - 12 Standard drinks or equivalent per week   Drug use: No   Sexual activity: Yes    Birth control/protection: None  Other Topics Concern   Not on file  Social History Narrative   Not on file   Social Determinants of Health   Financial Resource Strain: Low Risk    Difficulty of Paying Living Expenses: Not hard at all  Food Insecurity: No Food Insecurity   Worried About Programme researcher, broadcasting/film/video in the Last Year: Never true   Ran Out of Food in the Last Year: Never true  Transportation Needs: No Transportation Needs   Lack of Transportation (Medical): No   Lack of Transportation (Non-Medical): No  Physical Activity: Sufficiently Active   Days of Exercise per Week: 5 days   Minutes of Exercise per Session: 30 min  Stress: Stress Concern Present   Feeling of Stress : To some extent  Social Connections:    Frequency of Communication with Friends and Family: Not on file   Frequency of Social Gatherings with Friends and Family: Not on file   Attends Religious Services: Not on Scientist, clinical (histocompatibility and immunogenetics) or Organizations: Not on file    Attends Banker Meetings: Not on file   Marital Status: Not on file    Tobacco Counseling Counseling given: Not Answered   Clinical Intake:  Pre-visit preparation completed: Yes  Pain : No/denies pain     Nutritional Risks: None Diabetes: Yes CBG done?: No Did pt. bring in CBG monitor from home?: No  How often do you need to have someone help you when you read instructions, pamphlets, or other written materials from your doctor or pharmacy?: 1 - Never What is the last grade level you completed in school?: HSG; 2 years of college  Diabetic? yes  Interpreter Needed?: No  Information entered by :: Brayson Livesey N. Abenezer Odonell, LPN   Activities of Daily Living In your present state of health, do you have  any difficulty performing the following activities: 11/19/2019  Hearing? N  Vision? N  Difficulty concentrating or making decisions? Y  Comment recently dx with neurocognitive d/o; treatment starts 12/2019  Walking or climbing stairs? N  Dressing or bathing? N  Doing errands, shopping? N  Preparing Food and eating ? N  Using the Toilet? N  In the past six months, have you accidently leaked urine? N  Do you have problems with loss of bowel control? N  Managing your Medications? N  Managing your Finances? N  Housekeeping or managing your Housekeeping? N  Some recent data might be hidden    Patient Care Team: Myrlene Broker, MD as PCP - General (Internal Medicine) Readling, Curlene Labrum, MD as Consulting Physician (Psychiatry) Van Clines, MD as Consulting Physician (Neurology)  Indicate any recent Medical Services you may have received from other than [facility] providers in the past year (date may be approximate).     Assessment:   This is a routine wellness examination for Zara.  Hearing/Vision screen No exam data present  Dietary issues and exercise activities discussed: Current Exercise Habits: Home exercise routine, Type of exercise: walking;Other -  see comments (walking, balance and flexability), Time (Minutes): 30, Frequency (Times/Week): 5, Weekly Exercise (Minutes/Week): 150, Intensity: Moderate, Exercise limited by: None identified  Goals      Patient Stated     I want to lose weight by increasing my physical activity and monitoring my diet more closely.       Depression Screen PHQ 2/9 Scores 11/19/2019 10/13/2018 08/23/2017  PHQ - 2 Score 1 1 2   PHQ- 9 Score - 1 4    Fall Risk Fall Risk  11/19/2019 10/13/2018 09/27/2016  Falls in the past year? 0 0 No  Number falls in past yr: 0 0 -  Injury with Fall? 0 - -  Risk for fall due to : No Fall Risks History of fall(s) -  Follow up Falls evaluation completed;Education provided - -    Any stairs in or around the home? Yes  If so, are there any without handrails? No  Home free of loose throw rugs in walkways, pet beds, electrical cords, etc? Yes  Adequate lighting in your home to reduce risk of falls? Yes   ASSISTIVE DEVICES UTILIZED TO PREVENT FALLS:  Life alert? No  Use of a cane, walker or w/c? No  Grab bars in the bathroom? No  Shower chair or bench in shower? No  Elevated toilet seat or a handicapped toilet? No   TIMED UP AND GO:  Was the test performed? No .  Length of time to ambulate 10 feet: 0 sec.   Gait steady and fast without use of assistive device (per patient)  Cognitive Function: Patient stated that she was recently diagnosed with neurocognitive disorder and will be seeking treatment in September 2021 with [facility]. MMSE - Mini Mental State Exam 11/19/2019  Not completed: Unable to complete   [facility] Cognitive Assessment  09/27/2016  Visuospatial/ Executive (0/5) 4  Naming (0/3) 3  Attention: Read list of digits (0/2) 2  Attention: Read list of letters (0/1) 1  Attention: Serial 7 subtraction starting at 100 (0/3) 3  Language: Repeat phrase (0/2) 2  Language : Fluency (0/1) 1  Abstraction (0/2) 2  Delayed Recall (0/5) 5    Orientation (0/6) 6  Total 29      Immunizations Immunization History  Administered Date(s) Administered   Influenza Split 12/07/2011   Influenza,inj,Quad PF,6+  Mos 12/11/2017, 12/09/2018   Influenza-Unspecified 12/21/2015, 01/14/2017, 01/14/2018, 12/26/2018   Moderna SARS-COVID-2 Vaccination 09/01/2019   Pneumococcal Polysaccharide-23 01/11/2008, 01/05/2016   Tdap 01/06/2012   Zoster 10/12/2015    TDAP status: Up to date Flu Vaccine status: Up to date Pneumococcal vaccine status: Up to date Covid-19 vaccine status: Completed vaccines  Qualifies for Shingles Vaccine? Yes   Zostavax completed Yes   Shingrix Completed?: Yes  Screening Tests Health Maintenance  Topic Date Due   HIV Screening  Never done   PAP SMEAR-Modifier  04/16/2017   COVID-19 Vaccine (2 - Moderna 2-dose series) 09/29/2019   FOOT EXAM  10/10/2019   INFLUENZA VACCINE  11/01/2019   OPHTHALMOLOGY EXAM  01/12/2020   HEMOGLOBIN A1C  03/09/2020   MAMMOGRAM  01/20/2021   TETANUS/TDAP  01/05/2022   COLONOSCOPY  06/07/2026   PNEUMOCOCCAL POLYSACCHARIDE VACCINE AGE 3-64 HIGH RISK  Completed   Hepatitis C Screening  Completed    Health Maintenance  Health Maintenance Due  Topic Date Due   HIV Screening  Never done   PAP SMEAR-Modifier  04/16/2017   COVID-19 Vaccine (2 - Moderna 2-dose series) 09/29/2019   FOOT EXAM  10/10/2019   INFLUENZA VACCINE  11/01/2019    Colorectal cancer screening: Completed 06/06/2016. Repeat every 10 years Mammogram status: Completed 01/21/2019. Repeat every year Bone Density status: never ordered  Lung Cancer Screening: (Low Dose CT Chest recommended if Age 18-80 years, 30 pack-year currently smoking OR have quit w/in 15years.) does not qualify.   Lung Cancer Screening Referral: no  Additional Screening:  Hepatitis C Screening: does qualify; Completed Yes  Vision Screening: Recommended annual ophthalmology exams for early detection of glaucoma and other disorders of  the eye. Is the patient up to date with their annual eye exam?  Yes  Who is the provider or what is the name of the office in which the patient attends annual eye exams? Jethro Bolus, MD If pt is not established with a provider, would they like to be referred to a provider to establish care? No .   Dental Screening: Recommended annual dental exams for proper oral hygiene  Community Resource Referral / Chronic Care Management: CRR required this visit?  No   CCM required this visit?  No      Plan:     I have personally reviewed and noted the following in the patients chart:   Medical and social history Use of alcohol, tobacco or illicit drugs  Current medications and supplements Functional ability and status Nutritional status Physical activity Advanced directives List of other physicians Hospitalizations, surgeries, and ER visits in previous 12 months Vitals Screenings to include cognitive, depression, and falls Referrals and appointments  In addition, I have reviewed and discussed with patient certain preventive protocols, quality metrics, and best practice recommendations. A written personalized care plan for preventive services as well as general preventive health recommendations were provided to patient.     Mickeal Needy, LPN   10/10/6267   Nurse Notes:  Patient stated that she was recently diagnosed with neurocognitive disorder and will be seeking treatment in September 2021 with [facility]. There were no vitals filed for this visit. There is no height or weight on file to calculate BMI. Patient stated that she has no issues with gait or balance; does not use any assistive devices.   Medical screening examination/treatment/procedure(s) were performed by non-physician practitioner and as supervising physician I was immediately available for consultation/collaboration.  I agree with above. Aleksei Plotnikov,  MD

## 2019-11-24 ENCOUNTER — Ambulatory Visit: Payer: Medicare HMO | Admitting: Family Medicine

## 2019-12-15 DIAGNOSIS — F419 Anxiety disorder, unspecified: Secondary | ICD-10-CM | POA: Diagnosis not present

## 2019-12-16 ENCOUNTER — Ambulatory Visit: Payer: Medicare HMO | Admitting: Internal Medicine

## 2019-12-16 ENCOUNTER — Encounter: Payer: Self-pay | Admitting: Internal Medicine

## 2019-12-16 ENCOUNTER — Other Ambulatory Visit: Payer: Self-pay

## 2019-12-16 ENCOUNTER — Ambulatory Visit (INDEPENDENT_AMBULATORY_CARE_PROVIDER_SITE_OTHER): Payer: Medicare HMO | Admitting: Internal Medicine

## 2019-12-16 VITALS — BP 130/70 | HR 111 | Temp 98.9°F | Ht 65.0 in | Wt 128.0 lb

## 2019-12-16 DIAGNOSIS — I10 Essential (primary) hypertension: Secondary | ICD-10-CM | POA: Diagnosis not present

## 2019-12-16 DIAGNOSIS — E1169 Type 2 diabetes mellitus with other specified complication: Secondary | ICD-10-CM | POA: Diagnosis not present

## 2019-12-16 DIAGNOSIS — E785 Hyperlipidemia, unspecified: Secondary | ICD-10-CM

## 2019-12-16 NOTE — Patient Instructions (Signed)
Please continue all other medications as before, and refills have been done if requested.  Please have the pharmacy call with any other refills you may need.  Please continue your efforts at being more active, low cholesterol diet, and weight control.  Please keep your appointments with your specialists as you may have planned  Please make an Appointment to return in 3 months 

## 2019-12-16 NOTE — Assessment & Plan Note (Addendum)
stable overall by history and exam, recent data reviewed with pt, and pt to continue medical treatment as before,  to f/u any worsening symptoms or concerns  I spent 31 minutes in preparing to see the patient by review of recent labs, imaging and procedures, obtaining and reviewing separately obtained history, communicating with the patient and family or caregiver, ordering medications, tests or procedures, and documenting clinical information in the EHR including the differential Dx, treatment, and any further evaluation and other management of dm, htn, hld  

## 2019-12-16 NOTE — Progress Notes (Signed)
Subjective:    Patient ID: Diana Watson, female    DOB: 05-12-1955, 64 y.o.   MRN: [id number]  HPI  Here to f/u; overall doing ok,  Pt denies chest pain, increasing sob or doe, wheezing, orthopnea, PND, increased LE swelling, palpitations, dizziness or syncope.  Pt denies new neurological symptoms such as new headache, or facial or extremity weakness or numbness.  Pt denies polydipsia, polyuria, or low sugar episode.  Pt states overall good compliance with meds, mostly trying to follow appropriate diet, with wt overall stable,  but little exercise however. Peak wt has been 220 in past, now excercises every day Wt Readings from Last 3 Encounters:  12/16/19 128 lb (58.1 kg)  10/27/19 133 lb (60.3 kg)  10/14/19 138 lb (62.6 kg)   Past Medical History:  Diagnosis Date  . Bipolar 1 disorder (HCC)   . Constipation   . Depression   . Diabetes mellitus   . Herniated disc   . Hyperlipidemia   . Hypertension   . Neurocognitive disorder    major neurocognitive disorder  . Personality disorder [facility])    Past Surgical History:  Procedure Laterality Date  . DILATION AND CURETTAGE OF UTERUS    . RIGHT OOPHORECTOMY     Also removed a tumor at that time.    reports that she has never smoked. She has never used smokeless tobacco. She reports that she does not drink alcohol and does not use drugs. family history includes Bipolar disorder in her cousin and mother; Diabetes type II in her father and mother; Hypertension in her father and mother; OCD in her mother. Allergies  Allergen Reactions  . Prednisone Anxiety   Current Outpatient Medications on File Prior to Visit  Medication Sig Dispense Refill  . ACCU-CHEK AVIVA PLUS test strip CHECK BLOOD SUGAR TWICE DAILY 200 strip 5  . Alcohol Swabs PADS Use daily 100 each 3  . amitriptyline (ELAVIL) 25 MG tablet     . aspirin 81 MG tablet Take 81 mg by mouth daily.    Marland Kitchen buPROPion (WELLBUTRIN XL) 300 MG 24 hr tablet Take 300 mg by mouth daily.     . busPIRone (BUSPAR) 5 MG tablet Take 5 mg by mouth 2 (two) times daily.    . Calcium Citrate-Vitamin D (CALCIUM CITRATE + PO) Take by mouth.    . DOCUSATE SODIUM PO Take 400 mg by mouth.     . gabapentin (NEURONTIN) 300 MG capsule Take 1 capsule (300 mg total) by mouth 3 (three) times daily as needed (nerve or calf pain). 90 capsule 1  . lactulose (CHRONULAC) 10 GM/15ML solution TAKE 15 ML DAILY AS NEEDED FOR MILD CONSTIPATION. 946 mL 11  . lamoTRIgine (LAMICTAL) 200 MG tablet Take 200 mg by mouth every morning. For mood control    . lisinopril (ZESTRIL) 30 MG tablet TAKE 2 TABLETS EVERY DAY 180 tablet 1  . loperamide (IMODIUM) 2 MG capsule Take 6 mg by mouth as needed for diarrhea or loose stools.    . Multiple Vitamin (MULTIVITAMIN WITH MINERALS) TABS Take 1 tablet by mouth every morning.     . naproxen (NAPROSYN) 500 MG tablet Take 500 mg by mouth 3 (three) times daily as needed. For pain    . rosuvastatin (CRESTOR) 5 MG tablet TAKE 1 TABLET EVERY DAY 90 tablet 2  . ziprasidone (GEODON) 80 MG capsule Take 160 mg by mouth at bedtime.      No current facility-administered medications on file prior to visit.  Review of Systems All otherwise neg per pt    Objective:   Physical Exam BP 130/70 (BP Location: Left Arm, Patient Position: Sitting, Cuff Size: Large)   Pulse (!) 111   Temp 98.9 F (37.2 C) (Oral)   Ht 5\' 5"  (1.651 m)   Wt 128 lb (58.1 kg)   SpO2 98%   BMI 21.30 kg/m  VS noted,  Constitutional: Pt appears in NAD HENT: Head: NCAT.  Right Ear: External ear normal.  Left Ear: External ear normal.  Eyes: . Pupils are equal, round, and reactive to light. Conjunctivae and EOM are normal Nose: without d/c or deformity Neck: Neck supple. Gross normal ROM Cardiovascular: Normal rate and regular rhythm.   Pulmonary/Chest: Effort normal and breath sounds without rales or wheezing.  Abd:  Soft, NT, ND, + BS, no organomegaly Neurological: Pt is alert. At baseline orientation,  motor grossly intact Skin: Skin is warm. No rashes, other new lesions, no LE edema Psychiatric: Pt behavior is normal without agitation  All otherwise neg per pt  Hgba1c- POCT - 5.3  Lab Results  Component Value Date   WBC 5.2 09/08/2019   HGB 12.2 09/08/2019   HCT 36.1 09/08/2019   PLT 186.0 09/08/2019   GLUCOSE 92 09/08/2019   CHOL 141 09/08/2019   TRIG 52.0 09/08/2019   HDL 68.60 09/08/2019   LDLCALC 62 09/08/2019   ALT 29 09/08/2019   AST 32 09/08/2019   NA 135 09/08/2019   K 4.5 09/08/2019   CL 99 09/08/2019   CREATININE 1.05 09/08/2019   BUN 19 09/08/2019   CO2 31 09/08/2019   TSH 1.69 10/14/2019   HGBA1C 6.0 09/08/2019   MICROALBUR 0.50 10/04/2009      Assessment & Plan:

## 2019-12-16 NOTE — Assessment & Plan Note (Signed)
stable overall by history and exam, recent data reviewed with pt, and pt to continue medical treatment as before,  to f/u any worsening symptoms or concerns  

## 2019-12-21 LAB — POCT GLYCOSYLATED HEMOGLOBIN (HGB A1C): Hemoglobin A1C: 5.3 % (ref 4.0–5.6)

## 2019-12-21 NOTE — Addendum Note (Signed)
Addended by: Delsa Grana R on: 12/21/2019 11:42 AM   Modules accepted: Orders

## 2019-12-23 ENCOUNTER — Encounter: Payer: Self-pay | Admitting: *Deleted

## 2019-12-24 ENCOUNTER — Encounter: Payer: Self-pay | Admitting: Neurology

## 2019-12-24 ENCOUNTER — Other Ambulatory Visit: Payer: Self-pay

## 2019-12-24 ENCOUNTER — Ambulatory Visit: Payer: Medicare HMO | Admitting: Neurology

## 2019-12-24 VITALS — BP 138/68 | HR 80 | Ht 65.0 in | Wt 131.0 lb

## 2019-12-24 DIAGNOSIS — R799 Abnormal finding of blood chemistry, unspecified: Secondary | ICD-10-CM | POA: Diagnosis not present

## 2019-12-24 DIAGNOSIS — E538 Deficiency of other specified B group vitamins: Secondary | ICD-10-CM | POA: Diagnosis not present

## 2019-12-24 DIAGNOSIS — G3184 Mild cognitive impairment, so stated: Secondary | ICD-10-CM | POA: Insufficient documentation

## 2019-12-24 DIAGNOSIS — F319 Bipolar disorder, unspecified: Secondary | ICD-10-CM | POA: Insufficient documentation

## 2019-12-24 MED ORDER — MEMANTINE HCL 10 MG PO TABS: 10.0000 mg | ORAL_TABLET | Freq: Two times a day (BID) | ORAL | 11 refills | Status: DC

## 2019-12-24 NOTE — Progress Notes (Signed)
Chief Complaint  Patient presents with  . New Patient (Initial Visit)    MMSE 30/30 - 6 animals. She is here to have her worsening memory further evaluated.  Marland Kitchen PCP    Hoyt Koch, MD    HISTORICAL  Diana Watson is a 64 year old female, seen in request by her primary care physician Dr. Pricilla Holm for evaluation of memory loss, initial evaluation was on December 24, 2019.  I reviewed and summarized the referring note. 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  The most alarming symptoms recently was she left her pot burned on the stove  Laboratory evaluation in 2021: A1c 5.3, CPK 172, thyroid functional test, normal ESR 17, CBC, hemoglobin of 12.2, CMP, creatinine of 1.05, LDL 62,   REVIEW OF SYSTEMS: Full 14 system review of systems performed and notable only for as above All other review of systems were negative.  ALLERGIES: Allergies  Allergen Reactions  . Prednisone Anxiety    HOME MEDICATIONS: Current Outpatient Medications  Medication Sig Dispense Refill  . ACCU-CHEK AVIVA PLUS test strip CHECK BLOOD  SUGAR TWICE DAILY 200 strip 5  . Alcohol Swabs PADS Use daily 100 each 3  . amitriptyline (ELAVIL) 25 MG tablet     . aspirin 81 MG tablet Take 81 mg by mouth daily.    Marland Kitchen buPROPion (WELLBUTRIN XL) 300 MG 24 hr tablet Take 300 mg by mouth daily.    . busPIRone (BUSPAR) 5 MG tablet Take 5 mg by mouth 2 (two) times daily.    . Calcium Citrate-Vitamin D (CALCIUM CITRATE + PO) Take by mouth.    . DOCUSATE SODIUM PO Take 400 mg by mouth.     . lactulose (CHRONULAC) 10 GM/15ML solution TAKE 15 ML DAILY AS NEEDED FOR MILD CONSTIPATION. 946 mL 11  . lamoTRIgine (LAMICTAL) 200 MG tablet Take 200 mg by mouth every morning. For mood control    . lisinopril (ZESTRIL) 30 MG tablet TAKE 2 TABLETS EVERY DAY 180 tablet 1  . loperamide (IMODIUM) 2 MG capsule Take 6 mg by mouth as needed for diarrhea or loose stools.    . Multiple Vitamin (MULTIVITAMIN WITH MINERALS) TABS Take 1 tablet by mouth every morning.     . naproxen (NAPROSYN) 500 MG tablet Take 500 mg by mouth 3 (three) times daily as needed. For pain    . rosuvastatin (CRESTOR) 5 MG tablet TAKE 1 TABLET EVERY DAY 90 tablet 2  . ziprasidone (GEODON) 80 MG capsule Take 160 mg by mouth at bedtime.      No current facility-administered medications for this visit.    PAST MEDICAL HISTORY: Past Medical History:  Diagnosis Date  . Bipolar 1 disorder (  HCC)   . Constipation   . Depression   . Diabetes mellitus   . Herniated disc   . Hyperlipidemia   . Hypertension   . Neurocognitive disorder    major neurocognitive disorder  . Personality disorder ([place])     PAST SURGICAL HISTORY: Past Surgical History:  Procedure Laterality Date  . DILATION AND CURETTAGE OF UTERUS    . RIGHT OOPHORECTOMY     Also removed a tumor at that time.    FAMILY HISTORY: Family History  Problem Relation Age of Onset  . Diabetes type II Mother   . Hypertension Mother   . OCD Mother   . Bipolar disorder Mother   . Hyperlipidemia Mother   . Diabetes type II  Father   . Hypertension Father   . Prostate cancer Father   . Hyperlipidemia Father   . Bipolar disorder Cousin   . Pancreatic cancer Brother   . Colon cancer Neg Hx     SOCIAL HISTORY: Social History   Socioeconomic History  . Marital status: Married    Spouse name: Not on file  . Number of children: 0  . Years of education: some college  . Highest education level: Not on file  Occupational History  . Occupation: disability  Tobacco Use  . Smoking status: Never Smoker  . Smokeless tobacco: Never Used  Vaping Use  . Vaping Use: Never used  Substance and Sexual Activity  . Alcohol use: No    Alcohol/week: 6.0 - 12.0 standard drinks    Types: 6 - 12 Standard drinks or equivalent per week  . Drug use: Never  . Sexual activity: Yes    Birth control/protection: None  Other Topics Concern  . Not on file  Social History Narrative   Lives at home with husband.   Right-handed.   3-4 cups caffeine per day.   Social Determinants of Health   Financial Resource Strain: Low Risk   . Difficulty of Paying Living Expenses: Not hard at all  Food Insecurity: No Food Insecurity  . Worried About Charity fundraiser in the Last Year: Never true  . Ran Out of Food in the Last Year: Never true  Transportation Needs: No Transportation Needs  . Lack of Transportation (Medical): No  . Lack of Transportation (Non-Medical): No  Physical Activity: Sufficiently Active  . Days of Exercise per Week: 5 days  . Minutes of Exercise per Session: 30 min  Stress: Stress Concern Present  . Feeling of Stress : To some extent  Social Connections:   . Frequency of Communication with Friends and Family: Not on file  . Frequency of Social Gatherings with Friends and Family: Not on file  . Attends Religious Services: Not on file  . Active Member of Clubs or Organizations: Not on file  . Attends Archivist Meetings: Not on file  . Marital Status: Not on file  Intimate Partner Violence:   .  Fear of Current or Ex-Partner: Not on file  . Emotionally Abused: Not on file  . Physically Abused: Not on file  . Sexually Abused: Not on file     PHYSICAL EXAM   Vitals:   12/24/19 0816  BP: 138/68  Pulse: 80  Weight: 131 lb (59.4 kg)  Height: _0  (1.651 m)   Not recorded     Body mass index is 21.8 kg/m.  PHYSICAL EXAMNIATION:  Gen: NAD, conversant, well nourised, well groomed  Cardiovascular: Regular rate rhythm, no peripheral edema, warm, nontender. Eyes: Conjunctivae clear without exudates or hemorrhage Neck: Supple, no carotid bruits. Pulmonary: Clear to auscultation bilaterally   NEUROLOGICAL EXAM:  MENTAL STATUS: MMSE - Mini Mental State Exam 12/24/2019 11/19/2019  Not completed: - Unable to complete  Orientation to time 5 -  Orientation to Place 5 -  Registration 3 -  Attention/ Calculation 5 -  Recall 3 -  Language- name 2 objects 2 -  Language- repeat 1 -  Language- follow 3 step command 3 -  Language- read & follow direction 1 -  Write a sentence 1 -  Copy design 1 -  Total score 30 -  Animal naming 6 CRANIAL NERVES: CN II: Visual fields are full to confrontation. Pupils are round equal and briskly reactive to light. CN III, IV, VI: extraocular movement are normal. No ptosis. CN V: Facial sensation is intact to light touch CN VII: Face is symmetric with normal eye closure  CN VIII: Hearing is normal to causal conversation. CN IX, X: Phonation is normal. CN XI: Head turning and shoulder shrug are intact  MOTOR: There is no pronator drift of out-stretched arms. Muscle bulk and tone are normal. Muscle strength is normal.  REFLEXES: Reflexes are 2+ and symmetric at the biceps, triceps, knees, and ankles. Plantar responses are flexor.  SENSORY: Intact to light touch, pinprick and vibratory sensation are intact in fingers and toes.  COORDINATION: There is no trunk or limb dysmetria noted.  GAIT/STANCE: Able to get up  from seated position arm crossed  DIAGNOSTIC DATA (LABS, IMAGING, TESTING) - I reviewed patient records, labs, notes, testing and imaging myself where available.   ASSESSMENT AND PLAN  Diana Watson is a 64 y.o. female   Mild cognitive impairment  In the setting of bipolar disorder, polypharmacy treatment, suboptimal control of her mood disorder,  Does reported family history of dementia, maternal aunt has dementia,  Desire further evaluation and treatment   proceed with MRI of brain  Laboratory evaluations  Add on Namenda 10 mg twice a day   Marcial Pacas, M.D. Ph.D.  [facility] [address] [place], [place] [address] Ph: [phone] Fax: [phone]  CC:  Hoyt Koch, MD [address] [place],  [place] [address]

## 2019-12-25 LAB — VITAMIN B12: Vitamin B-12: 899 pg/mL (ref 232–1245)

## 2019-12-25 LAB — HIV ANTIBODY (ROUTINE TESTING W REFLEX): HIV Screen 4th Generation wRfx: NONREACTIVE

## 2019-12-25 LAB — FOLATE: Folate: >20 ng/mL (ref >3.0)

## 2019-12-25 LAB — RPR: RPR Ser Ql: NONREACTIVE

## 2019-12-25 LAB — SEDIMENTATION RATE: Sed Rate: 15 mm/hr (ref 0–40)

## 2019-12-25 LAB — C-REACTIVE PROTEIN: CRP: 1 mg/L (ref 0–10)

## 2019-12-28 ENCOUNTER — Telehealth: Payer: Self-pay | Admitting: Neurology

## 2019-12-28 NOTE — Telephone Encounter (Signed)
Diana Watson: [id number] (exp. 12/28/19 to 01/27/20) order sent to GI. They will reach out to the patient to schedule.

## 2019-12-28 NOTE — Telephone Encounter (Signed)
Patient is aware of her normal lab result.

## 2019-12-28 NOTE — Telephone Encounter (Signed)
-----   Message from Levert Feinstein, MD sent at 12/28/2019  9:54 AM EDT ----- Please call patient for normal laboratory result

## 2019-12-28 NOTE — Telephone Encounter (Signed)
[employer] pending  

## 2020-01-07 ENCOUNTER — Ambulatory Visit
Admission: RE | Admit: 2020-01-07 | Discharge: 2020-01-07 | Disposition: A | Discharge status: Home / Self Care | Payer: Medicare HMO | Source: Ambulatory Visit | Attending: Neurology | Admitting: Neurology

## 2020-01-07 DIAGNOSIS — F319 Bipolar disorder, unspecified: Secondary | ICD-10-CM

## 2020-01-07 DIAGNOSIS — G3184 Mild cognitive impairment, so stated: Secondary | ICD-10-CM | POA: Diagnosis not present

## 2020-01-11 ENCOUNTER — Telehealth: Payer: Self-pay | Admitting: *Deleted

## 2020-01-11 NOTE — Telephone Encounter (Signed)
-----   Message from Levert Feinstein, MD sent at 01/08/2020  4:07 PM EDT ----- Please call pt for no significant abnormalities on MRI of brain

## 2020-01-11 NOTE — Telephone Encounter (Signed)
I spoke to the patient and she verbalized understanding of the MRI results.

## 2020-01-19 DIAGNOSIS — H25013 Cortical age-related cataract, bilateral: Secondary | ICD-10-CM | POA: Diagnosis not present

## 2020-01-19 DIAGNOSIS — H524 Presbyopia: Secondary | ICD-10-CM | POA: Diagnosis not present

## 2020-01-19 DIAGNOSIS — H2513 Age-related nuclear cataract, bilateral: Secondary | ICD-10-CM | POA: Diagnosis not present

## 2020-01-19 DIAGNOSIS — H5203 Hypermetropia, bilateral: Secondary | ICD-10-CM | POA: Diagnosis not present

## 2020-01-19 DIAGNOSIS — H52203 Unspecified astigmatism, bilateral: Secondary | ICD-10-CM | POA: Diagnosis not present

## 2020-01-19 DIAGNOSIS — E1136 Type 2 diabetes mellitus with diabetic cataract: Secondary | ICD-10-CM | POA: Diagnosis not present

## 2020-01-25 ENCOUNTER — Other Ambulatory Visit: Payer: Self-pay | Admitting: Internal Medicine

## 2020-03-07 ENCOUNTER — Other Ambulatory Visit: Payer: Self-pay | Admitting: Internal Medicine

## 2020-03-07 DIAGNOSIS — Z1231 Encounter for screening mammogram for malignant neoplasm of breast: Secondary | ICD-10-CM

## 2020-03-08 DIAGNOSIS — F314 Bipolar disorder, current episode depressed, severe, without psychotic features: Secondary | ICD-10-CM | POA: Diagnosis not present

## 2020-03-08 DIAGNOSIS — F419 Anxiety disorder, unspecified: Secondary | ICD-10-CM | POA: Diagnosis not present

## 2020-03-10 ENCOUNTER — Other Ambulatory Visit: Payer: Self-pay

## 2020-03-10 ENCOUNTER — Ambulatory Visit
Admission: RE | Admit: 2020-03-10 | Discharge: 2020-03-10 | Disposition: A | Discharge status: Home / Self Care | Payer: Medicare HMO | Source: Ambulatory Visit | Attending: Internal Medicine | Admitting: Internal Medicine

## 2020-03-10 DIAGNOSIS — Z1231 Encounter for screening mammogram for malignant neoplasm of breast: Secondary | ICD-10-CM | POA: Diagnosis not present

## 2020-04-12 ENCOUNTER — Other Ambulatory Visit: Payer: Self-pay

## 2020-04-12 ENCOUNTER — Ambulatory Visit (INDEPENDENT_AMBULATORY_CARE_PROVIDER_SITE_OTHER): Payer: Medicare HMO | Admitting: Internal Medicine

## 2020-04-12 ENCOUNTER — Encounter: Payer: Self-pay | Admitting: Internal Medicine

## 2020-04-12 VITALS — BP 126/78 | HR 80 | Temp 98.1°F | Ht 65.0 in | Wt 135.6 lb

## 2020-04-12 DIAGNOSIS — E1169 Type 2 diabetes mellitus with other specified complication: Secondary | ICD-10-CM | POA: Diagnosis not present

## 2020-04-12 DIAGNOSIS — R7989 Other specified abnormal findings of blood chemistry: Secondary | ICD-10-CM | POA: Diagnosis not present

## 2020-04-12 DIAGNOSIS — I10 Essential (primary) hypertension: Secondary | ICD-10-CM | POA: Diagnosis not present

## 2020-04-12 DIAGNOSIS — K5900 Constipation, unspecified: Secondary | ICD-10-CM

## 2020-04-12 DIAGNOSIS — E785 Hyperlipidemia, unspecified: Secondary | ICD-10-CM | POA: Diagnosis not present

## 2020-04-12 DIAGNOSIS — Z0000 Encounter for general adult medical examination without abnormal findings: Secondary | ICD-10-CM | POA: Diagnosis not present

## 2020-04-12 LAB — CBC
HCT: 37.8 % (ref 36.0–46.0)
Hemoglobin: 12.6 g/dL (ref 12.0–15.0)
MCHC: 33.2 g/dL (ref 30.0–36.0)
MCV: 97.8 fl (ref 78.0–100.0)
Platelets: 173 10*3/uL (ref 150.0–400.0)
RBC: 3.87 Mil/uL (ref 3.87–5.11)
RDW: 12.2 % (ref 11.5–15.5)
WBC: 4.7 10*3/uL (ref 4.0–10.5)

## 2020-04-12 LAB — TSH: TSH: 2.8 u[IU]/mL (ref 0.35–4.50)

## 2020-04-12 LAB — COMPREHENSIVE METABOLIC PANEL
ALT: 25 U/L (ref 0–35)
AST: 23 U/L (ref 0–37)
Albumin: 4.8 g/dL (ref 3.5–5.2)
Alkaline Phosphatase: 65 U/L (ref 39–117)
BUN: 14 mg/dL (ref 6–23)
CO2: 32 mEq/L (ref 19–32)
Calcium: 10.2 mg/dL (ref 8.4–10.5)
Chloride: 99 mEq/L (ref 96–112)
Creatinine, Ser: 0.97 mg/dL (ref 0.40–1.20)
GFR: 62.47 mL/min (ref >60.00)
Glucose, Bld: 113 mg/dL — ABNORMAL HIGH (ref 70–99)
Potassium: 4.8 mEq/L (ref 3.5–5.1)
Sodium: 137 mEq/L (ref 135–145)
Total Bilirubin: 0.3 mg/dL (ref 0.2–1.2)
Total Protein: 8.2 g/dL (ref 6.0–8.3)

## 2020-04-12 LAB — MICROALBUMIN / CREATININE URINE RATIO
Creatinine,U: 17.1 mg/dL
Microalb Creat Ratio: 4.1 mg/g (ref 0.0–30.0)
Microalb, Ur: <0.7 mg/dL (ref 0.0–1.9)

## 2020-04-12 LAB — LIPID PANEL
Cholesterol: 146 mg/dL (ref 0–200)
HDL: 83.1 mg/dL (ref >39.00)
LDL Cholesterol: 54 mg/dL (ref 0–99)
NonHDL: 63.33
Total CHOL/HDL Ratio: 2
Triglycerides: 45 mg/dL (ref 0.0–149.0)
VLDL: 9 mg/dL (ref 0.0–40.0)

## 2020-04-12 LAB — HEMOGLOBIN A1C: Hgb A1c MFr Bld: 6 % (ref 4.6–6.5)

## 2020-04-12 LAB — T4, FREE: Free T4: 0.68 ng/dL (ref 0.60–1.60)

## 2020-04-12 MED ORDER — LACTULOSE 10 GM/15ML PO SOLN: 30.0000 g | Freq: Every day | ORAL | 11 refills | Status: DC | PRN

## 2020-04-12 NOTE — Patient Instructions (Signed)
Health Maintenance, Female Adopting a healthy lifestyle and getting preventive care are important in promoting health and wellness. Ask your health care provider about:  The right schedule for you to have regular tests and exams.  Things you can do on your own to prevent diseases and keep yourself healthy. What should I know about diet, weight, and exercise? Eat a healthy diet  Eat a diet that includes plenty of vegetables, fruits, low-fat dairy products, and lean protein.  Do not eat a lot of foods that are high in solid fats, added sugars, or sodium.   Maintain a healthy weight Body mass index (BMI) is used to identify weight problems. It estimates body fat based on height and weight. Your health care provider can help determine your BMI and help you achieve or maintain a healthy weight. Get regular exercise Get regular exercise. This is one of the most important things you can do for your health. Most adults should:  Exercise for at least 150 minutes each week. The exercise should increase your heart rate and make you sweat (moderate-intensity exercise).  Do strengthening exercises at least twice a week. This is in addition to the moderate-intensity exercise.  Spend less time sitting. Even light physical activity can be beneficial. Watch cholesterol and blood lipids Have your blood tested for lipids and cholesterol at 63 years of age, then have this test every 5 years. Have your cholesterol levels checked more often if:  Your lipid or cholesterol levels are high.  You are older than 63 years of age.  You are at high risk for heart disease. What should I know about cancer screening? Depending on your health history and family history, you may need to have cancer screening at various ages. This may include screening for:  Breast cancer.  Cervical cancer.  Colorectal cancer.  Skin cancer.  Lung cancer. What should I know about heart disease, diabetes, and high blood  pressure? Blood pressure and heart disease  High blood pressure causes heart disease and increases the risk of stroke. This is more likely to develop in people who have high blood pressure readings, are of African descent, or are overweight.  Have your blood pressure checked: ? Every 3-5 years if you are 18-39 years of age. ? Every year if you are 40 years old or older. Diabetes Have regular diabetes screenings. This checks your fasting blood sugar level. Have the screening done:  Once every three years after age 40 if you are at a normal weight and have a low risk for diabetes.  More often and at a younger age if you are overweight or have a high risk for diabetes. What should I know about preventing infection? Hepatitis B If you have a higher risk for hepatitis B, you should be screened for this virus. Talk with your health care provider to find out if you are at risk for hepatitis B infection. Hepatitis C Testing is recommended for:  Everyone born from 1945 through 1965.  Anyone with known risk factors for hepatitis C. Sexually transmitted infections (STIs)  Get screened for STIs, including gonorrhea and chlamydia, if: ? You are sexually active and are younger than 63 years of age. ? You are older than 63 years of age and your health care provider tells you that you are at risk for this type of infection. ? Your sexual activity has changed since you were last screened, and you are at increased risk for chlamydia or gonorrhea. Ask your health care provider   if you are at risk.  Ask your health care provider about whether you are at high risk for HIV. Your health care provider may recommend a prescription medicine to help prevent HIV infection. If you choose to take medicine to prevent HIV, you should first get tested for HIV. You should then be tested every 3 months for as long as you are taking the medicine. Pregnancy  If you are about to stop having your period (premenopausal) and  you may become pregnant, seek counseling before you get pregnant.  Take 400 to 800 micrograms (mcg) of folic acid every day if you become pregnant.  Ask for birth control (contraception) if you want to prevent pregnancy. Osteoporosis and menopause Osteoporosis is a disease in which the bones lose minerals and strength with aging. This can result in bone fractures. If you are 65 years old or older, or if you are at risk for osteoporosis and fractures, ask your health care provider if you should:  Be screened for bone loss.  Take a calcium or vitamin D supplement to lower your risk of fractures.  Be given hormone replacement therapy (HRT) to treat symptoms of menopause. Follow these instructions at home: Lifestyle  Do not use any products that contain nicotine or tobacco, such as cigarettes, e-cigarettes, and chewing tobacco. If you need help quitting, ask your health care provider.  Do not use street drugs.  Do not share needles.  Ask your health care provider for help if you need support or information about quitting drugs. Alcohol use  Do not drink alcohol if: ? Your health care provider tells you not to drink. ? You are pregnant, may be pregnant, or are planning to become pregnant.  If you drink alcohol: ? Limit how much you use to 0-1 drink a day. ? Limit intake if you are breastfeeding.  Be aware of how much alcohol is in your drink. In the [place] one drink equals one 12 oz bottle of beer (355 mL), one 5 oz glass of wine (148 mL), or one 1 oz glass of hard liquor (44 mL). General instructions  Schedule regular health, dental, and eye exams.  Stay current with your vaccines.  Tell your health care provider if: ? You often feel depressed. ? You have ever been abused or do not feel safe at home. Summary  Adopting a healthy lifestyle and getting preventive care are important in promoting health and wellness.  Follow your health care provider's instructions about healthy  diet, exercising, and getting tested or screened for diseases.  Follow your health care provider's instructions on monitoring your cholesterol and blood pressure. This information is not intended to replace advice given to you by your health care provider. Make sure you discuss any questions you have with your health care provider. Document Revised: 03/12/2018 Document Reviewed: 03/12/2018 Elsevier Patient Education  2021 Elsevier Inc.  

## 2020-04-12 NOTE — Progress Notes (Signed)
   Subjective:   Patient ID: Diana Watson, female    DOB: 1955/08/27, 65 y.o.   MRN: [id number]  HPI The patient is a 65 YO female coming in for physical.   PMH, FMH, social history reviewed and updated  Review of Systems  Constitutional: Negative.   HENT: Negative.   Eyes: Negative.   Respiratory: Negative for cough, chest tightness and shortness of breath.   Cardiovascular: Negative for chest pain, palpitations and leg swelling.  Gastrointestinal: Negative for abdominal distention, abdominal pain, constipation, diarrhea, nausea and vomiting.  Musculoskeletal: Negative.   Skin: Negative.   Neurological: Negative.   Psychiatric/Behavioral: Negative.     Objective:  Physical Exam Constitutional:      Appearance: She is well-developed and well-nourished.  HENT:     Head: Normocephalic and atraumatic.  Eyes:     Extraocular Movements: EOM normal.  Cardiovascular:     Rate and Rhythm: Normal rate and regular rhythm.  Pulmonary:     Effort: Pulmonary effort is normal. No respiratory distress.     Breath sounds: Normal breath sounds. No wheezing or rales.  Abdominal:     General: Bowel sounds are normal. There is no distension.     Palpations: Abdomen is soft.     Tenderness: There is no abdominal tenderness. There is no rebound.  Musculoskeletal:        General: No edema.     Cervical back: Normal range of motion.  Skin:    General: Skin is warm and dry.     Comments: Foot exam done  Neurological:     Mental Status: She is alert and oriented to person, place, and time.     Coordination: Coordination normal.  Psychiatric:        Mood and Affect: Mood and affect normal.     Vitals:   04/12/20 0808  BP: 126/78  Pulse: 80  Temp: 98.1 F (36.7 C)  TempSrc: Oral  SpO2: 95%  Weight: 135 lb 9.6 oz (61.5 kg)  Height: 5\' 5"  (1.651 m)    This visit occurred during the SARS-CoV-2 public health emergency.  Safety protocols were in place, including screening  questions prior to the visit, additional usage of staff PPE, and extensive cleaning of exam room while observing appropriate contact time as indicated for disinfecting solutions.   Assessment & Plan:

## 2020-04-15 NOTE — Assessment & Plan Note (Signed)
Checking TSH and free T4.  

## 2020-04-15 NOTE — Assessment & Plan Note (Signed)
Foot exam done, checking HgA1c and microalbumin to creatinine ratio. On ACE-I and statin. Diet controlled currently.

## 2020-04-15 NOTE — Assessment & Plan Note (Signed)
Refill lactulose

## 2020-04-15 NOTE — Assessment & Plan Note (Signed)
BP at goal on lisinopril and checking CMP and adjust as needed.  

## 2020-04-15 NOTE — Assessment & Plan Note (Signed)
Flu shot up to date. Covid-19 up to date including booster. Pneumonia due at 67. Shingrix counseled. Tetanus due 2023. Colonoscopy due 2028. Mammogram due 2023, pap smear up to date. Counseled about sun safety and mole surveillance. Counseled about the dangers of distracted driving. Given 10 year screening recommendations.

## 2020-04-15 NOTE — Assessment & Plan Note (Signed)
Checking lipid panel and adjust crestor 5 mg daily as needed. 

## 2020-05-10 ENCOUNTER — Other Ambulatory Visit: Payer: Self-pay | Admitting: Internal Medicine

## 2020-06-07 DIAGNOSIS — F314 Bipolar disorder, current episode depressed, severe, without psychotic features: Secondary | ICD-10-CM | POA: Diagnosis not present

## 2020-06-07 DIAGNOSIS — F419 Anxiety disorder, unspecified: Secondary | ICD-10-CM | POA: Diagnosis not present

## 2020-07-05 ENCOUNTER — Other Ambulatory Visit: Payer: Self-pay | Admitting: Internal Medicine

## 2020-07-06 ENCOUNTER — Telehealth: Payer: Self-pay | Admitting: Internal Medicine

## 2020-07-06 NOTE — Progress Notes (Signed)
  Chronic Care Management   Outreach Note  07/06/2020 Name: Diana Watson MRN: [id number] DOB: 1955/11/26  Referred by: Myrlene Broker, MD Reason for referral : No chief complaint on file.   An unsuccessful telephone outreach was attempted today. The patient was referred to the pharmacist for assistance with care management and care coordination.   Follow Up Plan:   Carley Perdue [employer] Scheduler

## 2020-07-07 ENCOUNTER — Ambulatory Visit (INDEPENDENT_AMBULATORY_CARE_PROVIDER_SITE_OTHER): Payer: Medicare HMO | Admitting: Internal Medicine

## 2020-07-07 ENCOUNTER — Other Ambulatory Visit: Payer: Self-pay

## 2020-07-07 ENCOUNTER — Encounter: Payer: Self-pay | Admitting: Internal Medicine

## 2020-07-07 DIAGNOSIS — R21 Rash and other nonspecific skin eruption: Secondary | ICD-10-CM | POA: Diagnosis not present

## 2020-07-07 MED ORDER — HYDROCORTISONE 1 % EX OINT: 1.0000 "application " | TOPICAL_OINTMENT | Freq: Two times a day (BID) | CUTANEOUS | 0 refills | Status: DC

## 2020-07-07 NOTE — Patient Instructions (Addendum)
We have sent in a cream to use on the face twice a day for 1-2 weeks.

## 2020-07-07 NOTE — Progress Notes (Signed)
   Subjective:   Patient ID: Diana Watson, female    DOB: 01/02/1956, 65 y.o.   MRN: [id number]  HPI The patient is a 65 YO female coming in for concerns about rash on face. Started after she was wearing a new mask. She denies itching or pain with the rash. It is on her nose, cheeks, forehead. She denies fevers or chills. Denies change in medication or doses. Denies new otc products. Does not use makeup. Started several weeks ago and not improving overall. Denies that the rash is sun sensitive but she does not go out much. Denies trying anything for it. Not worsening or improving.   Review of Systems  Constitutional: Negative.   HENT: Negative.   Eyes: Negative.   Respiratory: Negative for cough, chest tightness and shortness of breath.   Cardiovascular: Negative for chest pain, palpitations and leg swelling.  Gastrointestinal: Negative for abdominal distention, abdominal pain, constipation, diarrhea, nausea and vomiting.  Musculoskeletal: Negative.   Skin: Positive for rash.  Neurological: Negative.   Psychiatric/Behavioral: Negative.     Objective:  Physical Exam Constitutional:      Appearance: She is well-developed.  HENT:     Head: Normocephalic and atraumatic.  Cardiovascular:     Rate and Rhythm: Normal rate and regular rhythm.  Pulmonary:     Effort: Pulmonary effort is normal. No respiratory distress.     Breath sounds: Normal breath sounds. No wheezing or rales.  Abdominal:     General: Bowel sounds are normal. There is no distension.     Palpations: Abdomen is soft.     Tenderness: There is no abdominal tenderness. There is no rebound.  Musculoskeletal:     Cervical back: Normal range of motion.  Skin:    General: Skin is warm and dry.     Findings: Rash present.     Comments: Rash on the nose, cheeks without tenderness, blanching  Neurological:     Mental Status: She is alert and oriented to person, place, and time.     Coordination: Coordination normal.      Vitals:   07/07/20 0810  BP: 122/70  Pulse: 79  Resp: 18  Temp: 97.9 F (36.6 C)  TempSrc: Oral  SpO2: 100%  Weight: 136 lb 12.8 oz (62.1 kg)  Height: 5\' 5"  (1.651 m)    This visit occurred during the SARS-CoV-2 public health emergency.  Safety protocols were in place, including screening questions prior to the visit, additional usage of staff PPE, and extensive cleaning of exam room while observing appropriate contact time as indicated for disinfecting solutions.   Assessment & Plan:

## 2020-07-08 ENCOUNTER — Encounter: Payer: Self-pay | Admitting: Internal Medicine

## 2020-07-08 DIAGNOSIS — R21 Rash and other nonspecific skin eruption: Secondary | ICD-10-CM | POA: Insufficient documentation

## 2020-07-08 DIAGNOSIS — L719 Rosacea, unspecified: Secondary | ICD-10-CM | POA: Insufficient documentation

## 2020-07-08 NOTE — Assessment & Plan Note (Signed)
Could be related to the new mask. Rx hydrocortisone cream to use for 1-2 weeks. If no improvement there is possibility of drug induced lupus and we can check for that.

## 2020-07-22 ENCOUNTER — Other Ambulatory Visit: Payer: Self-pay

## 2020-07-22 ENCOUNTER — Encounter: Payer: Self-pay | Admitting: Internal Medicine

## 2020-07-22 ENCOUNTER — Ambulatory Visit (INDEPENDENT_AMBULATORY_CARE_PROVIDER_SITE_OTHER): Payer: Medicare HMO | Admitting: Internal Medicine

## 2020-07-22 VITALS — BP 142/88 | HR 83 | Temp 98.0°F | Ht 65.0 in | Wt 140.0 lb

## 2020-07-22 DIAGNOSIS — E1169 Type 2 diabetes mellitus with other specified complication: Secondary | ICD-10-CM

## 2020-07-22 DIAGNOSIS — R21 Rash and other nonspecific skin eruption: Secondary | ICD-10-CM

## 2020-07-22 LAB — POCT GLYCOSYLATED HEMOGLOBIN (HGB A1C): Hemoglobin A1C: 5.4 % (ref 4.0–5.6)

## 2020-07-22 NOTE — Assessment & Plan Note (Signed)
Checking ANA for drug induced lupus. If negative will treat for rosacea.

## 2020-07-22 NOTE — Progress Notes (Signed)
   Subjective:   Patient ID: Diana Watson, female    DOB: Mar 22, 1956, 65 y.o.   MRN: [id number]  HPI The patient is a 65 YO female coming in for follow up diabetes (diet controlled currently, working on exercising more and diet, denies new numbness or tingling) and rash (on face, rx hydrocortisone cream at last visit which has not helped, lots of flushing with doing anything or even leaning over, no change in medications prior to onset, there was a new mask prior to onset she thought may have caused, overall worsening to stable).   Review of Systems  Constitutional: Negative.   HENT: Negative.   Eyes: Negative.   Respiratory: Negative for cough, chest tightness and shortness of breath.   Cardiovascular: Negative for chest pain, palpitations and leg swelling.  Gastrointestinal: Negative for abdominal distention, abdominal pain, constipation, diarrhea, nausea and vomiting.  Musculoskeletal: Negative.   Skin: Positive for rash.  Neurological: Negative.   Psychiatric/Behavioral: Negative.     Objective:  Physical Exam Constitutional:      Appearance: She is well-developed.  HENT:     Head: Normocephalic and atraumatic.     Comments: Red rash on the nose and cheeks Cardiovascular:     Rate and Rhythm: Normal rate and regular rhythm.  Pulmonary:     Effort: Pulmonary effort is normal. No respiratory distress.     Breath sounds: Normal breath sounds. No wheezing or rales.  Abdominal:     General: Bowel sounds are normal. There is no distension.     Palpations: Abdomen is soft.     Tenderness: There is no abdominal tenderness. There is no rebound.  Musculoskeletal:     Cervical back: Normal range of motion.  Skin:    General: Skin is warm and dry.  Neurological:     Mental Status: She is alert and oriented to person, place, and time.     Coordination: Coordination normal.     Vitals:   07/22/20 0810  BP: (!) 142/88  Pulse: 83  Temp: 98 F (36.7 C)  TempSrc: Oral   SpO2: 98%  Weight: 140 lb (63.5 kg)  Height: 5\' 5"  (1.651 m)    This visit occurred during the SARS-CoV-2 public health emergency.  Safety protocols were in place, including screening questions prior to the visit, additional usage of staff PPE, and extensive cleaning of exam room while observing appropriate contact time as indicated for disinfecting solutions.   Assessment & Plan:  Visit time 25 minutes in face to face communication with patient and coordination of care, additional 5 minutes spent in record review, coordination or care, ordering tests, communicating/referring to other healthcare professionals, documenting in medical records all on the same day of the visit for total time 30 minutes spent on the visit.

## 2020-07-22 NOTE — Assessment & Plan Note (Signed)
HgA1c 5.4 today which is improved from prior. She is diet controlled currently. High risk for recurrence given her mental health medications.

## 2020-07-22 NOTE — Patient Instructions (Signed)
We will check the labs today for the drug induced rash.  If not this we will send in cream to treat rosacea.

## 2020-07-25 ENCOUNTER — Telehealth: Payer: Self-pay | Admitting: Internal Medicine

## 2020-07-25 NOTE — Progress Notes (Signed)
  Chronic Care Management   Note  07/25/2020 Name: Diana Watson MRN: [id number] DOB: Jan 16, 1956  Diana Watson is a 65 y.o. year old female who is a primary care patient of Myrlene Broker, MD. I reached out to Moise Boring by phone today in response to a referral sent by [name] PCP, Myrlene Broker, MD.   [name] was given information about Chronic Care Management services today including:  1. CCM service includes personalized support from designated clinical staff supervised by her physician, including individualized plan of care and coordination with other care providers 2. 24/7 contact phone numbers for assistance for urgent and routine care needs. 3. Service will only be billed when office clinical staff spend 20 minutes or more in a month to coordinate care. 4. Only one practitioner may furnish and bill the service in a calendar month. 5. The patient may stop CCM services at any time (effective at the end of the month) by phone call to the office staff.   Patient agreed to services and verbal consent obtained.   Follow up plan:   Carley Perdue [employer] Scheduler

## 2020-07-26 ENCOUNTER — Other Ambulatory Visit: Payer: Self-pay | Admitting: Internal Medicine

## 2020-07-26 LAB — ANA, IFA COMPREHENSIVE PANEL
Anti Nuclear Antibody (ANA): NEGATIVE
ENA SM Ab Ser-aCnc: <1 AI
SM/RNP: <1 AI
SSA (Ro) (ENA) Antibody, IgG: <1 AI
SSB (La) (ENA) Antibody, IgG: <1 AI
Scleroderma (Scl-70) (ENA) Antibody, IgG: <1 AI
ds DNA Ab: <1 IU/mL

## 2020-07-26 MED ORDER — BRIMONIDINE TARTRATE 0.2 % OP SOLN: OPHTHALMIC | 12 refills | Status: DC

## 2020-08-01 ENCOUNTER — Telehealth: Payer: Self-pay | Admitting: Internal Medicine

## 2020-08-01 MED ORDER — BRIMONIDINE TARTRATE 0.2 % OP SOLN: OPHTHALMIC | 12 refills | Status: DC

## 2020-08-01 NOTE — Telephone Encounter (Signed)
Patient was seen on 04.22.22 and said there was some cream that was supposed to be sent into [facility] but they never received it. Patient states it started with a p

## 2020-08-01 NOTE — Telephone Encounter (Signed)
Brimonidine is for the face and is an eye ointment but should be used topically on the face twice a day for the rash. This was sent to [facility] on 07/26/20 if not received can be resent.

## 2020-08-01 NOTE — Telephone Encounter (Signed)
Medication has been resent to [facility]. 

## 2020-08-01 NOTE — Telephone Encounter (Signed)
See below

## 2020-08-01 NOTE — Addendum Note (Signed)
Addended by: Manuela Schwartz on: 08/01/2020 04:41 PM   Modules accepted: Orders

## 2020-08-05 ENCOUNTER — Telehealth: Payer: Self-pay | Admitting: Internal Medicine

## 2020-08-05 NOTE — Telephone Encounter (Signed)
Unable to get in contact with the patient. LVM asking her to give our office a call back to discuss. Office number was provided.

## 2020-08-05 NOTE — Telephone Encounter (Signed)
    Please call patient, she has questions again about a medication that starts with letter "P" Advised patient no medication on med list with "letter P"  Patient likely referring to brimonidine (ALPHAGAN) 0.2 % ophthalmic solution, however she declined to discuss with scheduler.  Please call patient

## 2020-08-15 ENCOUNTER — Ambulatory Visit (INDEPENDENT_AMBULATORY_CARE_PROVIDER_SITE_OTHER): Payer: Medicare HMO | Admitting: Internal Medicine

## 2020-08-15 ENCOUNTER — Encounter: Payer: Self-pay | Admitting: Internal Medicine

## 2020-08-15 ENCOUNTER — Other Ambulatory Visit: Payer: Self-pay

## 2020-08-15 ENCOUNTER — Telehealth: Payer: Self-pay | Admitting: *Deleted

## 2020-08-15 DIAGNOSIS — L719 Rosacea, unspecified: Secondary | ICD-10-CM | POA: Diagnosis not present

## 2020-08-15 MED ORDER — METRONIDAZOLE 0.75 % EX GEL: 1.0000 "application " | Freq: Two times a day (BID) | CUTANEOUS | 3 refills | Status: DC

## 2020-08-15 NOTE — Assessment & Plan Note (Signed)
We discussed how drug induced lupus was ruled out with labs at last visit. Will change to metronidazole 0.75% gel (can change to cream or lotion if needed due to coverage) and follow up in 3 months.

## 2020-08-15 NOTE — Patient Instructions (Signed)
We have sent in metronidazole gel to use twice a day on the rash to help this.  Let us know if not covered and we can try something else.

## 2020-08-15 NOTE — Progress Notes (Signed)
   Subjective:   Patient ID: Diana Watson, female    DOB: [birth year], 65 y.o.   MRN: [id number]  HPI The patient is a 65 YO female coming in for follow up rosacea. We had prescribed brimonidine to use topically and this was too expensive. She did want to go over lab results from last time. Denies progressive but no improvement to rash.   Review of Systems  Constitutional: Negative.   HENT: Negative.   Eyes: Negative.   Respiratory: Negative for cough, chest tightness and shortness of breath.   Cardiovascular: Negative for chest pain, palpitations and leg swelling.  Gastrointestinal: Negative for abdominal distention, abdominal pain, constipation, diarrhea, nausea and vomiting.  Musculoskeletal: Negative.   Skin: Positive for rash.  Neurological: Negative.   Psychiatric/Behavioral: Negative.     Objective:  Physical Exam Constitutional:      Appearance: She is well-developed.  HENT:     Head: Normocephalic and atraumatic.     Comments: Facial rash on the cheeks and nose Cardiovascular:     Rate and Rhythm: Normal rate and regular rhythm.  Pulmonary:     Effort: Pulmonary effort is normal. No respiratory distress.     Breath sounds: Normal breath sounds. No wheezing or rales.  Abdominal:     General: Bowel sounds are normal. There is no distension.     Palpations: Abdomen is soft.     Tenderness: There is no abdominal tenderness. There is no rebound.  Musculoskeletal:     Cervical back: Normal range of motion.  Skin:    General: Skin is warm and dry.  Neurological:     Mental Status: She is alert and oriented to person, place, and time.     Coordination: Coordination normal.     Vitals:   08/15/20 0916  BP: 128/64  Pulse: 82  Temp: 98.9 F (37.2 C)  TempSrc: Oral  SpO2: 98%  Weight: 142 lb 12.8 oz (64.8 kg)  Height: 5\' 5"  (1.651 m)    This visit occurred during the SARS-CoV-2 public health emergency.  Safety protocols were in place, including screening  questions prior to the visit, additional usage of staff PPE, and extensive cleaning of exam room while observing appropriate contact time as indicated for disinfecting solutions.   Assessment & Plan:

## 2020-08-15 NOTE — Telephone Encounter (Signed)
Faxed signed request/authorization release of medical records [facility]. Confirmation page [facility]

## 2020-08-30 DIAGNOSIS — F419 Anxiety disorder, unspecified: Secondary | ICD-10-CM | POA: Diagnosis not present

## 2020-08-30 DIAGNOSIS — F314 Bipolar disorder, current episode depressed, severe, without psychotic features: Secondary | ICD-10-CM | POA: Diagnosis not present

## 2020-08-31 ENCOUNTER — Ambulatory Visit (INDEPENDENT_AMBULATORY_CARE_PROVIDER_SITE_OTHER): Payer: Medicare HMO | Admitting: Family Medicine

## 2020-08-31 ENCOUNTER — Other Ambulatory Visit: Payer: Self-pay

## 2020-08-31 VITALS — BP 154/94 | HR 78 | Ht 65.0 in | Wt 146.8 lb

## 2020-08-31 DIAGNOSIS — S7012XA Contusion of left thigh, initial encounter: Secondary | ICD-10-CM | POA: Diagnosis not present

## 2020-08-31 NOTE — Progress Notes (Signed)
   I, Philbert Riser, LAT, ATC acting as a scribe for Clementeen Graham, MD.  Diana Watson is a 65 y.o. female who presents to [facility] at [facility] today for L thigh pain ongoing since Sunday, 5/29. MOI: Pt was doing squats, lost her balance, and then twisted her knee and fell landing on L side. Pt was previously seen by Dr. Denyse Amass on 10/14/19 for bilat calf pain and general myopathy. Today, pt locates pain to the entire thigh, anterior, medial, and posterior. Pt reports inability/increased pain to sit and transition from sitting to stand.  Low back pain: no Radiates: no LE Numbness/tingling: no LE Weakness: no Aggravates: sitting, increased activity Treatments tried: icy hot, creams, rest  Dx testing: 11/18/19 LE vascular US  10/14/19 Labs (sed rate, thyroid panel w/ TSH, CK)   01/06/10 L-spine XR  Pertinent review of systems: No fevers or chills  Relevant historical information: Diabetes   Exam:  BP (!) 154/94 (BP Location: Left Arm, Patient Position: Standing, Cuff Size: Normal)   Pulse 78   Ht 5\' 5"  (1.651 m)   Wt 146 lb 12.8 oz (66.6 kg)   SpO2 96%   BMI 24.43 kg/m  General: Well Developed, well nourished, and in no acute distress.   MSK: Left hip normal. Normal motion. Mildly tender palpation greater trochanter. Hip abduction strength diminished 4/5. Left thigh normal.  Nontender normal knee motion.  Knee extension strength diminished 4/5 with pain. Mild antalgic gait.    Assessment and Plan: 65 y.o. female with fall with pain left lateral thigh and left lateral hip.  Patient suffered a contusion but is significantly improving now compared to where she was a day after the initial injury.  Fracture risk is extremely low.  Plan for physical therapy home exercise program and watchful waiting.  Recheck in a few weeks if not improving.   PDMP not reviewed this encounter. Orders Placed This Encounter  Procedures  . Ambulatory referral to Physical  Therapy    Referral Priority:   Routine    Referral Type:   Physical Medicine    Referral Reason:   Specialty Services Required    Requested Specialty:   Physical Therapy   No orders of the defined types were placed in this encounter.    Discussed warning signs or symptoms. Please see discharge instructions. Patient expresses understanding.   The above documentation has been reviewed and is accurate and complete 66, M.D.

## 2020-08-31 NOTE — Patient Instructions (Signed)
Thank you for coming in today.  I've referred you to Physical Therapy.  Let us know if you don't hear from them in one week.  If you get better cancel the PT.   Let me know if you have a problem.   Recheck with me if not improving.

## 2020-09-12 ENCOUNTER — Telehealth: Payer: Self-pay | Admitting: Pharmacist

## 2020-09-12 NOTE — Progress Notes (Signed)
Chronic Care Management Pharmacy Assistant   Name: Diana Watson MRN: [id number] DOB: 1955-07-27  Reason for Encounter: Initial Questions Appointment: OV 10/06/20 @ 9 am   Recent office visits:  08/15/20 Okey Dupre (PCP) - Rosacea. Start Metronidazole. D/c Brimonidine Tartrate.  07/22/20 Crawford (PCP) - Type 2 diabetes. No med changes.  07/07/20 Crawford (PCP) - Rash. Start Hydrocortisone. Increase Buspirone to 10 mg.  04/12/20 Crawford (PCP) - General Exam. Increase lactulose to 30 g.  Recent consult visits:  08/31/20 Denyse Amass (Sports Medicine) - Contusion of left thigh. Referral to PT.  06/07/20 Amematsro - Bipolar disorder.  Hospital visits:  None in previous 6 months  Medications: Outpatient Encounter Medications as of 09/12/2020  Medication Sig Note   ACCU-CHEK AVIVA PLUS test strip CHECK BLOOD SUGAR TWICE DAILY 08/15/2020: use   Alcohol Swabs PADS Use daily 08/15/2020: use   amitriptyline (ELAVIL) 25 MG tablet     aspirin 81 MG tablet Take 81 mg by mouth daily.    buPROPion (WELLBUTRIN XL) 300 MG 24 hr tablet Take 300 mg by mouth daily.    busPIRone (BUSPAR) 10 MG tablet Take 10 mg by mouth in the morning, at noon, and at bedtime.    Calcium Citrate-Vitamin D (CALCIUM CITRATE + PO) Take by mouth.    DOCUSATE SODIUM PO Take 400 mg by mouth.     hydrocortisone 1 % ointment Apply 1 application topically 2 (two) times daily. (Patient not taking: Reported on 08/15/2020)    lactulose (CHRONULAC) 10 GM/15ML solution Take 45 mLs (30 g total) by mouth daily as needed for mild constipation.    lamoTRIgine (LAMICTAL) 200 MG tablet Take 200 mg by mouth every morning. For mood control    lisinopril (ZESTRIL) 30 MG tablet TAKE 2 TABLETS EVERY DAY    loperamide (IMODIUM) 2 MG capsule Take 6 mg by mouth as needed for diarrhea or loose stools.    memantine (NAMENDA) 10 MG tablet Take 1 tablet (10 mg total) by mouth 2 (two) times daily. (Patient not taking: Reported on 08/15/2020)     metroNIDAZOLE (METROGEL) 0.75 % gel Apply 1 application topically 2 (two) times daily.    Multiple Vitamin (MULTIVITAMIN WITH MINERALS) TABS Take 1 tablet by mouth every morning.     naproxen (NAPROSYN) 500 MG tablet Take 500 mg by mouth 3 (three) times daily as needed. For pain    rosuvastatin (CRESTOR) 5 MG tablet TAKE 1 TABLET EVERY DAY    ziprasidone (GEODON) 80 MG capsule Take 160 mg by mouth at bedtime.     No facility-administered encounter medications on file as of 09/12/2020.    Have you seen any other providers since your last visit?  Patient states she saw her psychiatrist recently.   Any changes in your medications or health?  Patient states she had a increase in her Buspar because she's been having manic episodes.  Any side effects from any medications?  Patient states no side effects.  Do you have an symptoms or problems not managed by your medications?  Patient states no symptoms or problems.  Any concerns about your health right now?  Patient states her memory is getting worse and she forgets a lot.  Has your provider asked that you check blood pressure, blood sugar, or follow special diet at home?  Patient states she checks her Glucose 2x a week. Today's reading was 101-AM and 86-PM. She does not check her BP at home or follow a special diet.  Do you get any type  of exercise on a regular basis?  Patient states she exercise 2x a day and before Covid she was going to the gym.  Can you think of a goal you would like to reach for your health?  Patient states no goals at this time.  Do you have any problems getting your medications?  Patient states no problems getting meds she using mail order.  Is there anything that you would like to discuss during the appointment?  Patient states no concerns at this time.  Please bring medications and supplements to appointment   Star Rating Drugs: Rosuvastatin - last fill 12/24/19 90D  (Patient states she has received refill  from mail order) Lisinopril - last fill 4/722 90D   Benedict Needy, RMA Clinical Pharmacists Assistant [phone]  Time Spent: [phone]

## 2020-09-14 ENCOUNTER — Ambulatory Visit: Payer: Medicare HMO

## 2020-10-05 ENCOUNTER — Telehealth: Payer: Self-pay | Admitting: Pharmacist

## 2020-10-05 NOTE — Progress Notes (Signed)
error 

## 2020-10-06 ENCOUNTER — Ambulatory Visit: Payer: Medicare HMO

## 2020-10-06 NOTE — Progress Notes (Deleted)
Chronic Care Management Pharmacy Note  10/06/2020 Name:  Diana Watson MRN:  [id number] DOB:  [birth year]  Summary: ***  Recommendations/Changes made from today's visit: ***  Plan: ***   Subjective: Diana Watson is an 65 y.o. year old female who is a primary patient of Hoyt Koch, MD.  The CCM team was consulted for assistance with disease management and care coordination needs.    Engaged with patient face to face for initial visit in response to provider referral for pharmacy case management and/or care coordination services.   Consent to Services:  The patient was given the following information about Chronic Care Management services today, agreed to services, and gave verbal consent: 1. CCM service includes personalized support from designated clinical staff supervised by the primary care provider, including individualized plan of care and coordination with other care providers 2. 24/7 contact phone numbers for assistance for urgent and routine care needs. 3. Service will only be billed when office clinical staff spend 20 minutes or more in a month to coordinate care. 4. Only one practitioner may furnish and bill the service in a calendar month. 5.The patient may stop CCM services at any time (effective at the end of the month) by phone call to the office staff. 6. The patient will be responsible for cost sharing (co-pay) of up to 20% of the service fee (after annual deductible is met). Patient agreed to services and consent obtained.  Patient Care Team: Hoyt Koch, MD as PCP - General (Internal Medicine) Readling, Milana Huntsman, MD as Consulting Physician (Psychiatry) Cameron Sprang, MD as Consulting Physician (Neurology) Charlton Haws, [facility] as Pharmacist (Pharmacist)  Recent office visits: 08/15/20 Sharlet Salina (PCP) - Rosacea. Start Metronidazole. D/c Brimonidine Tartrate.   07/22/20 Crawford (PCP) - Type 2 diabetes, rash on face. ANA negative for  drug-induced lupus. Treat for rosacea. No med changes.   07/07/20 Crawford (PCP) - Rash. Start Hydrocortisone. Increase Buspirone to 10 mg.   04/12/20 Crawford (PCP) - General Exam. Increase lactulose to 30 g.  Recent consult visits: 08/31/20 Georgina Snell (Sports Medicine) - Contusion of left thigh. Referral to PT.   06/07/20 Amematsro (psych) - Bipolar disorder.  12/24/19 Dr Krista Blue (neurology): initial visit for cognitive impairment. Ordered MRI (unremarkable) and labs (normal). Start Namenda.  Hospital visits: None in previous 6 months   Objective:  Lab Results  Component Value Date   CREATININE 0.97 04/12/2020   BUN 14 04/12/2020   GFR 62.47 04/12/2020   GFRNONAA 54 (L) 11/11/2011   GFRAA 63 (L) 11/11/2011   NA 137 04/12/2020   K 4.8 04/12/2020   CALCIUM 10.2 04/12/2020   CO2 32 04/12/2020   GLUCOSE 113 (H) 04/12/2020    Lab Results  Component Value Date/Time   HGBA1C 5.4 07/22/2020 08:22 AM   HGBA1C 6.0 04/12/2020 08:37 AM   HGBA1C 5.3 12/21/2019 11:37 AM   HGBA1C 6.0 09/08/2019 08:49 AM   FRUCTOSAMINE 301 (H) 10/27/2019 09:53 AM   GFR 62.47 04/12/2020 08:37 AM   GFR 64.20 09/08/2019 08:49 AM   MICROALBUR <0.7 04/12/2020 08:37 AM   MICROALBUR 0.50 10/04/2009 09:27 PM    Last diabetic Eye exam:  Lab Results  Component Value Date/Time   HMDIABEYEEXA No Retinopathy 01/12/2019 12:00 AM    Last diabetic Foot exam: No results found for: HMDIABFOOTEX   Lab Results  Component Value Date   CHOL 146 04/12/2020   HDL 83.10 04/12/2020   LDLCALC 54 04/12/2020   TRIG 45.0 04/12/2020   CHOLHDL  2 04/12/2020    Hepatic Function Latest Ref Rng & Units 04/12/2020 09/08/2019 07/14/2019  Total Protein 6.0 - 8.3 g/dL 8.2 8.0 7.9  Albumin 3.5 - 5.2 g/dL 4.8 4.8 4.7  AST 0 - 37 U/L 23 32 28  ALT 0 - 35 U/L '25 29 28  ' Alk Phosphatase 39 - 117 U/L 65 62 73  Total Bilirubin 0.2 - 1.2 mg/dL 0.3 0.3 0.2  Bilirubin, Direct 0.0 - 0.3 mg/dL - - -    Lab Results  Component Value Date/Time   TSH  2.80 04/12/2020 08:37 AM   TSH 1.69 10/14/2019 08:22 AM   FREET4 0.68 04/12/2020 08:37 AM   FREET4 0.56 (L) 10/10/2018 09:06 AM    CBC Latest Ref Rng & Units 04/12/2020 09/08/2019 07/14/2019  WBC 4.0 - 10.5 K/uL 4.7 5.2 4.8  Hemoglobin 12.0 - 15.0 g/dL 12.6 12.2 12.8  Hematocrit 36.0 - 46.0 % 37.8 36.1 38.3  Platelets 150.0 - 400.0 K/uL 173.0 186.0 199.0    Lab Results  Component Value Date/Time   VD25OH 37 05/01/2011 06:22 AM    Clinical ASCVD: No  The 10-year ASCVD risk score Mikey Bussing DC Jr., et al., 2013) is: 19.1%   Values used to calculate the score:     Age: 55 years     Sex: Female     Is Non-Hispanic African American: Yes     Diabetic: Yes     Tobacco smoker: No     Systolic Blood Pressure: 734 mmHg     Is BP treated: Yes     HDL Cholesterol: 83.1 mg/dL     Total Cholesterol: 146 mg/dL    Depression screen [facility] 2/9 07/22/2020 11/19/2019 10/13/2018  Decreased Interest 0 0 0  Down, Depressed, Hopeless 0 1 1  PHQ - 2 Score 0 1 1  Altered sleeping - - 0  Tired, decreased energy - - 0  Change in appetite - - 0  Feeling bad or failure about yourself  - - 0  Trouble concentrating - - 0  Moving slowly or fidgety/restless - - 0  Suicidal thoughts - - 0  PHQ-9 Score - - 1  Difficult doing work/chores - - -    No flowsheet data found.   Social History   Tobacco Use  Smoking Status Never  Smokeless Tobacco Never   BP Readings from Last 3 Encounters:  08/31/20 (!) 154/94  08/15/20 128/64  07/22/20 (!) 142/88   Pulse Readings from Last 3 Encounters:  08/31/20 78  08/15/20 82  07/22/20 83   Wt Readings from Last 3 Encounters:  08/31/20 146 lb 12.8 oz (66.6 kg)  08/15/20 142 lb 12.8 oz (64.8 kg)  07/22/20 140 lb (63.5 kg)   BMI Readings from Last 3 Encounters:  08/31/20 24.43 kg/m  08/15/20 23.76 kg/m  07/22/20 23.30 kg/m    Assessment/Interventions: Review of patient past medical history, allergies, medications, health status, including review of  consultants reports, laboratory and other test data, was performed as part of comprehensive evaluation and provision of chronic care management services.   SDOH:  (Social Determinants of Health) assessments and interventions performed: Yes  SDOH Screenings   Alcohol Screen: Low Risk    Last Alcohol Screening Score (AUDIT): 0  Depression (PHQ2-9): Low Risk    PHQ-2 Score: 0  Financial Resource Strain: Low Risk    Difficulty of Paying Living Expenses: Not hard at all  Food Insecurity: No Food Insecurity   Worried About Charity fundraiser in the  Last Year: Never true   Ran Out of Food in the Last Year: Never true  Housing: Low Risk    Last Housing Risk Score: 0  Physical Activity: Sufficiently Active   Days of Exercise per Week: 5 days   Minutes of Exercise per Session: 30 min  Social Connections: Not on file  Stress: Stress Concern Present   Feeling of Stress : To some extent  Tobacco Use: Low Risk    Smoking Tobacco Use: Never   Smokeless Tobacco Use: Never  Transportation Needs: No Transportation Needs   Lack of Transportation (Medical): No   Lack of Transportation (Non-Medical): No    CCM Care Plan  Allergies  Allergen Reactions   Prednisone Anxiety    Medications Reviewed Today     Reviewed by Mare Ferrari (Technician) on 08/31/20 at 36  Med List Status: <None>   Medication Order Taking? Sig Documenting Provider Last Dose Status Informant  ACCU-CHEK AVIVA PLUS test strip [id number] No CHECK BLOOD SUGAR TWICE DAILY Hoyt Koch, MD Taking Active            Med Note Melvia Heaps Aug 15, 2020  9:22 AM) use  Alcohol Swabs PADS [id number]  Use daily Hoyt Koch, MD  Active            Med Note Blanch Media, CYNTHIA A   Mon Aug 15, 2020  9:22 AM) use  amitriptyline (ELAVIL) 25 MG tablet [id number] No  [provider] Taking Active   aspirin 81 MG tablet [id number] No Take 81 mg by mouth daily. [provider] Taking Active    buPROPion (WELLBUTRIN XL) 300 MG 24 hr tablet [id number] No Take 300 mg by mouth daily. [provider] Taking Active   busPIRone (BUSPAR) 10 MG tablet [id number] No Take 10 mg by mouth in the morning, at noon, and at bedtime. [provider] Taking Active   Calcium Citrate-Vitamin D (CALCIUM CITRATE + PO) [id number] No Take by mouth. [provider] Taking Active   DOCUSATE SODIUM PO [id number] No Take 400 mg by mouth.  [provider] Taking Active   hydrocortisone 1 % ointment [id number] No Apply 1 application topically 2 (two) times daily.  Patient not taking: Reported on 08/15/2020   Hoyt Koch, MD Not Taking Active   lactulose [facility]) 10 GM/15ML solution [id number] No Take 45 mLs (30 g total) by mouth daily as needed for mild constipation. Hoyt Koch, MD Taking Active   lamoTRIgine (LAMICTAL) 200 MG tablet [id number] No Take 200 mg by mouth every morning. For mood control Darrol Jump, MD Taking Active Self  lisinopril (ZESTRIL) 30 MG tablet [id number] No TAKE 2 TABLETS EVERY DAY Hoyt Koch, MD Taking Active   loperamide (IMODIUM) 2 MG capsule [id number] No Take 6 mg by mouth as needed for diarrhea or loose stools. [provider] Taking Active   memantine (NAMENDA) 10 MG tablet [id number] No Take 1 tablet (10 mg total) by mouth 2 (two) times daily.  Patient not taking: Reported on 08/15/2020   Marcial Pacas, MD Not Taking Active   metroNIDAZOLE (METROGEL) 0.75 % gel [id number]  Apply 1 application topically 2 (two) times daily. Hoyt Koch, MD  Active   Multiple Vitamin (MULTIVITAMIN WITH MINERALS) TABS [id number] No Take 1 tablet by mouth every morning.  [provider] Taking Active Self  naproxen (NAPROSYN) 500 MG tablet [id number] No Take 500 mg by mouth 3 (three) times daily as  needed. For pain [provider] Taking Active Self  rosuvastatin (CRESTOR) 5 MG tablet [id number] No TAKE 1 TABLET EVERY  DAY Hoyt Koch, MD Taking Active   ziprasidone (GEODON) 80 MG capsule [id number] No Take 160 mg by mouth at bedtime.  [provider] Taking Active Self            Patient Active Problem List   Diagnosis Date Noted   Rosacea 07/08/2020   Bipolar 1 disorder ([place]) 12/24/2019   Mild cognitive impairment 12/24/2019   Myalgia 07/14/2019   Routine general medical examination at a health care facility 04/15/2017   Low T4 01/27/2016   Major neurocognitive disorder ([place]) 01/07/2016   Constipation 09/20/2015   Diabetes ([place]) 09/20/2015   Essential hypertension 09/20/2015   Hyperlipidemia associated with type 2 diabetes mellitus ([place]) 09/20/2015   Bipolar affective disorder, rapid cycling ([place]) 04/30/2011    Immunization History  Administered Date(s) Administered   Influenza Split 12/07/2011   Influenza,inj,Quad PF,6+ Mos 12/11/2017, 12/09/2018   Influenza-Unspecified 12/21/2015, 01/14/2017, 01/14/2018, 12/26/2018, 02/01/2020   Moderna Sars-Covid-2 Vaccination 09/01/2019, 09/29/2019, 04/11/2020   Pneumococcal Polysaccharide-23 01/11/2008, 01/05/2016   Tdap 01/06/2012   Zoster, Live 10/12/2015    Conditions to be addressed/monitored:  Hypertension and Hyperlipidemia, Bipolar 1 disorder, major neurocognitive disorder  There are no care plans that you recently modified to display for this patient.    Medication Assistance: {MEDASSISTANCEINFO:25044}  Compliance/Adherence/Medication fill history: Care Gaps: Shingrix vaccine Covid booster (due 08/09/20) Pap smear (due 04/16/17) Eye exam (due 01/12/20)  Star-Rating Drugs: Lisinopril - LF 07/07/20 x 90 ds  Patient's preferred pharmacy is:  [facility] - [place], [address] [place] [address] Phone: [phone] Fax: [place], [place] [address] [place] [address] Phone: [phone] Fax: [phone]  Uses pill box? {Yes or If no, why not?:20788} Pt endorses ***% compliance  We discussed: {Pharmacy options:24294} Patient decided to: {US Pharmacy Plan:23885}  Care Plan and Follow Up Patient Decision:  {FOLLOWUP:24991}  Plan: {CM FOLLOW UP XUXY:33383}  ***    Current Barriers:  {pharmacybarriers:24917}  Pharmacist Clinical Goal(s):  Patient will {PHARMACYGOALCHOICES:24921} through collaboration with PharmD and provider.   Interventions: 1:1 collaboration with Hoyt Koch, MD regarding development and update of comprehensive plan of care as evidenced by provider attestation and co-signature Inter-disciplinary care team collaboration (see longitudinal plan of care) Comprehensive medication review performed; medication list updated in electronic medical record  Hypertension (BP goal <130/80) -{US controlled/uncontrolled:25276} -Current treatment: Lisinopril 30 mg - 2 tab daily -Medications previously tried: ***  -Current home readings: *** -Current dietary habits: *** -Current exercise habits: *** -{ACTIONS;DENIES/REPORTS:21021675} hypotensive/hypertensive symptoms -Educated on {CCM BP Counseling:25124} -Counseled to monitor BP at home ***, document, and provide log at future appointments -{CCMPHARMDINTERVENTION:25122}  Hyperlipidemia: (LDL goal < 100) -{US controlled/uncontrolled:25276} -Current treatment: Rosuvastatin 5 mg daily - LF 12/24/19 Aspirin 81 mg daily -Medications previously tried: ***  -Current dietary patterns: *** -Current exercise habits: *** -Educated on {CCM HLD Counseling:25126} -{CCMPHARMDINTERVENTION:25122}  Prediabetes (Goal: A1c < 6.5%) -{US controlled/uncontrolled:25276} -Current treatment  Testing supplies -Medications previously tried: ***  -{CCMPHARMDINTERVENTION:25122}  Bipolar 1 disorder (Goal: manage symptoms) -{US  controlled/uncontrolled:25276} -Current treatment: Amitriptyline 25 mg Bupropion XL 300 mg daily Buspirone 10 mg TID Ziprasidone 80 mg - 2 cap HS Lamotrigine 200 mg daily -Medications previously  tried/failed: *** -PHQ9: 0 (07/2020) -GAD7: not on file -Connected with *** for mental health support -Educated on {CCM mental health counseling:25127} -{CCMPHARMDINTERVENTION:25122}  Major neurocognitive disorder (Goal: slow progression) -{US controlled/uncontrolled:25276} -Current treatment  Memantine 10 mg BID - LF 12/24/19 -Medications previously tried: ***  -{CCMPHARMDINTERVENTION:25122}  Constipation / Diarrhea (Goal: ***) -{US controlled/uncontrolled:25276} -Current treatment  Docusate Lactulose 30 mg (45 mL) PRN Loperamide 2 mg PRN -Medications previously tried: ***  -{CCMPHARMDINTERVENTION:25122}  Health Maintenance -Vaccine gaps: Shingrix, covid booster -Current therapy:  Calcium citrate-Vitamin D Multivitamin Hydrocortisone 1% ointment Naproxen 500 mg PRN -Educated on {ccm supplement counseling:25128} -{CCM Patient satisfied:25129} -{CCMPHARMDINTERVENTION:25122}  Patient Goals/Self-Care Activities Patient will:  - {pharmacypatientgoals:24919}

## 2020-10-15 ENCOUNTER — Other Ambulatory Visit: Payer: Self-pay | Admitting: Internal Medicine

## 2020-11-15 ENCOUNTER — Ambulatory Visit (INDEPENDENT_AMBULATORY_CARE_PROVIDER_SITE_OTHER): Payer: Medicare HMO | Admitting: Internal Medicine

## 2020-11-15 ENCOUNTER — Encounter: Payer: Self-pay | Admitting: Internal Medicine

## 2020-11-15 ENCOUNTER — Other Ambulatory Visit: Payer: Self-pay

## 2020-11-15 VITALS — BP 130/90 | HR 73 | Temp 97.5°F | Resp 18 | Ht 65.0 in | Wt 134.0 lb

## 2020-11-15 DIAGNOSIS — E1169 Type 2 diabetes mellitus with other specified complication: Secondary | ICD-10-CM

## 2020-11-15 DIAGNOSIS — L719 Rosacea, unspecified: Secondary | ICD-10-CM | POA: Diagnosis not present

## 2020-11-15 LAB — POCT GLYCOSYLATED HEMOGLOBIN (HGB A1C): Hemoglobin A1C: 5.6 % (ref 4.0–5.6)

## 2020-11-15 NOTE — Progress Notes (Signed)
   Subjective:   Patient ID: Diana Watson, female    DOB: 1955/12/19, 65 y.o.   MRN: [id number]  HPI The patient is a 65 YO female coming in for follow up sugars. She does take mental health medications and so does monitor sugars closely. She is still exercising and no new concerns. Rash is improving dramatically with metronidazole gel.   Review of Systems  Constitutional: Negative.   HENT: Negative.    Eyes: Negative.   Respiratory:  Negative for cough, chest tightness and shortness of breath.   Cardiovascular:  Negative for chest pain, palpitations and leg swelling.  Gastrointestinal:  Negative for abdominal distention, abdominal pain, constipation, diarrhea, nausea and vomiting.  Musculoskeletal: Negative.   Skin: Negative.   Neurological: Negative.   Psychiatric/Behavioral: Negative.     Objective:  Physical Exam Constitutional:      Appearance: She is well-developed.  HENT:     Head: Normocephalic and atraumatic.  Cardiovascular:     Rate and Rhythm: Normal rate and regular rhythm.  Pulmonary:     Effort: Pulmonary effort is normal. No respiratory distress.     Breath sounds: Normal breath sounds. No wheezing or rales.  Abdominal:     General: Bowel sounds are normal. There is no distension.     Palpations: Abdomen is soft.     Tenderness: There is no abdominal tenderness. There is no rebound.  Musculoskeletal:     Cervical back: Normal range of motion.  Skin:    General: Skin is warm and dry.  Neurological:     Mental Status: She is alert and oriented to person, place, and time.     Coordination: Coordination normal.    Vitals:   11/15/20 0829  BP: 130/90  Pulse: 73  Resp: 18  Temp: (!) 97.5 F (36.4 C)  TempSrc: Oral  SpO2: 98%  Weight: 134 lb (60.8 kg)  Height: 5\' 5"  (1.651 m)    This visit occurred during the SARS-CoV-2 public health emergency.  Safety protocols were in place, including screening questions prior to the visit, additional usage of  staff PPE, and extensive cleaning of exam room while observing appropriate contact time as indicated for disinfecting solutions.   Assessment & Plan:

## 2020-11-15 NOTE — Assessment & Plan Note (Signed)
Doing well with metronidazole gel and almost complete resolution.

## 2020-11-15 NOTE — Assessment & Plan Note (Signed)
Needs close monitoring due to mental health medications making recurrence likely. HgA1c done in office today 5.6 which is good. She is down about 5 pounds intentionally since last visit.

## 2020-11-15 NOTE — Patient Instructions (Addendum)
Your HgA1c is 5.6

## 2020-11-21 ENCOUNTER — Ambulatory Visit (INDEPENDENT_AMBULATORY_CARE_PROVIDER_SITE_OTHER): Payer: Medicare HMO

## 2020-11-21 DIAGNOSIS — Z0000 Encounter for general adult medical examination without abnormal findings: Secondary | ICD-10-CM | POA: Diagnosis not present

## 2020-11-21 NOTE — Patient Instructions (Addendum)
Diana Watson , Thank you for taking time to come for your Medicare Wellness Visit. I appreciate your ongoing commitment to your health goals. Please review the following plan we discussed and let me know if I can assist you in the future.   Screening recommendations/referrals: Colonoscopy: 06/06/2016; due every 10 years Mammogram: 03/10/2020; due every year Bone Density: never done Recommended yearly ophthalmology/optometry visit for glaucoma screening and checkup Recommended yearly dental visit for hygiene and checkup  Vaccinations: Influenza vaccine: 02/01/2020 Pneumococcal vaccine: 01/05/2016; need Prevnar13 Tdap vaccine: 01/06/2012; due every 10 years Shingles vaccine: no record  Covid-19: 09/01/2019, 09/29/2019, 04/11/2020  Advanced directives: Advance directive discussed with you today. I have provided a copy for you to complete at home and have notarized. Once this is complete please bring a copy in to our office so we can scan it into your chart.  Conditions/risks identified: Yes; Client understands the importance of follow-up with providers by attending scheduled visits and discussed goals to eat healthier, increase physical activity, exercise the brain, socialize more, get enough sleep and make time for laughter.  Next appointment: November 22, 2021 at 8:30 am telephone visit.  Preventive Care 40-64 Years, Female Preventive care refers to lifestyle choices and visits with your health care provider that can promote health and wellness. What does preventive care include? A yearly physical exam. This is also called an annual well check. Dental exams once or twice a year. Routine eye exams. Ask your health care provider how often you should have your eyes checked. Personal lifestyle choices, including: Daily care of your teeth and gums. Regular physical activity. Eating a healthy diet. Avoiding tobacco and drug use. Limiting alcohol use. Practicing safe sex. Taking low-dose  aspirin daily starting at age 1. Taking vitamin and mineral supplements as recommended by your health care provider. What happens during an annual well check? The services and screenings done by your health care provider during your annual well check will depend on your age, overall health, lifestyle risk factors, and family history of disease. Counseling  Your health care provider may ask you questions about your: Alcohol use. Tobacco use. Drug use. Emotional well-being. Home and relationship well-being. Sexual activity. Eating habits. Work and work Statistician. Method of birth control. Menstrual cycle. Pregnancy history. Screening  You may have the following tests or measurements: Height, weight, and BMI. Blood pressure. Lipid and cholesterol levels. These may be checked every 5 years, or more frequently if you are over 18 years old. Skin check. Lung cancer screening. You may have this screening every year starting at age 1 if you have a 30-pack-year history of smoking and currently smoke or have quit within the past 15 years. Fecal occult blood test (FOBT) of the stool. You may have this test every year starting at age 48. Flexible sigmoidoscopy or colonoscopy. You may have a sigmoidoscopy every 5 years or a colonoscopy every 10 years starting at age 28. Hepatitis C blood test. Hepatitis B blood test. Sexually transmitted disease (STD) testing. Diabetes screening. This is done by checking your blood sugar (glucose) after you have not eaten for a while (fasting). You may have this done every 1-3 years. Mammogram. This may be done every 1-2 years. Talk to your health care provider about when you should start having regular mammograms. This may depend on whether you have a family history of breast cancer. BRCA-related cancer screening. This may be done if you have a family history of breast, ovarian, tubal, or peritoneal cancers. Pelvic exam  and Pap test. This may be done every 3  years starting at age 41. Starting at age 86, this may be done every 5 years if you have a Pap test in combination with an HPV test. Bone density scan. This is done to screen for osteoporosis. You may have this scan if you are at high risk for osteoporosis. Discuss your test results, treatment options, and if necessary, the need for more tests with your health care provider. Vaccines  Your health care provider may recommend certain vaccines, such as: Influenza vaccine. This is recommended every year. Tetanus, diphtheria, and acellular pertussis (Tdap, Td) vaccine. You may need a Td booster every 10 years. Zoster vaccine. You may need this after age 61. Pneumococcal 13-valent conjugate (PCV13) vaccine. You may need this if you have certain conditions and were not previously vaccinated. Pneumococcal polysaccharide (PPSV23) vaccine. You may need one or two doses if you smoke cigarettes or if you have certain conditions. Talk to your health care provider about which screenings and vaccines you need and how often you need them. This information is not intended to replace advice given to you by your health care provider. Make sure you discuss any questions you have with your health care provider. Document Released: 04/15/2015 Document Revised: 12/07/2015 Document Reviewed: 01/18/2015 Elsevier Interactive Patient Education  2017 [place] Prevention in the Home Falls can cause injuries. They can happen to people of all ages. There are many things you can do to make your home safe and to help prevent falls. What can I do on the outside of my home? Regularly fix the edges of walkways and driveways and fix any cracks. Remove anything that might make you trip as you walk through a door, such as a raised step or threshold. Trim any bushes or trees on the path to your home. Use bright outdoor lighting. Clear any walking paths of anything that might make someone trip, such as rocks or  tools. Regularly check to see if handrails are loose or broken. Make sure that both sides of any steps have handrails. Any raised decks and porches should have guardrails on the edges. Have any leaves, snow, or ice cleared regularly. Use sand or salt on walking paths during winter. Clean up any spills in your garage right away. This includes oil or grease spills. What can I do in the bathroom? Use night lights. Install grab bars by the toilet and in the tub and shower. Do not use towel bars as grab bars. Use non-skid mats or decals in the tub or shower. If you need to sit down in the shower, use a plastic, non-slip stool. Keep the floor dry. Clean up any water that spills on the floor as soon as it happens. Remove soap buildup in the tub or shower regularly. Attach bath mats securely with double-sided non-slip rug tape. Do not have throw rugs and other things on the floor that can make you trip. What can I do in the bedroom? Use night lights. Make sure that you have a light by your bed that is easy to reach. Do not use any sheets or blankets that are too big for your bed. They should not hang down onto the floor. Have a firm chair that has side arms. You can use this for support while you get dressed. Do not have throw rugs and other things on the floor that can make you trip. What can I do in the kitchen? Clean up  any spills right away. Avoid walking on wet floors. Keep items that you use a lot in easy-to-reach places. If you need to reach something above you, use a strong step stool that has a grab bar. Keep electrical cords out of the way. Do not use floor polish or wax that makes floors slippery. If you must use wax, use non-skid floor wax. Do not have throw rugs and other things on the floor that can make you trip. What can I do with my stairs? Do not leave any items on the stairs. Make sure that there are handrails on both sides of the stairs and use them. Fix handrails that are  broken or loose. Make sure that handrails are as long as the stairways. Check any carpeting to make sure that it is firmly attached to the stairs. Fix any carpet that is loose or worn. Avoid having throw rugs at the top or bottom of the stairs. If you do have throw rugs, attach them to the floor with carpet tape. Make sure that you have a light switch at the top of the stairs and the bottom of the stairs. If you do not have them, ask someone to add them for you. What else can I do to help prevent falls? Wear shoes that: Do not have high heels. Have rubber bottoms. Are comfortable and fit you well. Are closed at the toe. Do not wear sandals. If you use a stepladder: Make sure that it is fully opened. Do not climb a closed stepladder. Make sure that both sides of the stepladder are locked into place. Ask someone to hold it for you, if possible. Clearly mark and make sure that you can see: Any grab bars or handrails. First and last steps. Where the edge of each step is. Use tools that help you move around (mobility aids) if they are needed. These include: Canes. Walkers. Scooters. Crutches. Turn on the lights when you go into a dark area. Replace any light bulbs as soon as they burn out. Set up your furniture so you have a clear path. Avoid moving your furniture around. If any of your floors are uneven, fix them. If there are any pets around you, be aware of where they are. Review your medicines with your doctor. Some medicines can make you feel dizzy. This can increase your chance of falling. Ask your doctor what other things that you can do to help prevent falls. This information is not intended to replace advice given to you by your health care provider. Make sure you discuss any questions you have with your health care provider. Document Released: 01/13/2009 Document Revised: 08/25/2015 Document Reviewed: 04/23/2014 Elsevier Interactive Patient Education  2017 [employer].

## 2020-11-21 NOTE — Progress Notes (Signed)
I connected with Diana Watson today by telephone and verified that I am speaking with the correct person using two identifiers. Location patient: home Location provider: work Persons participating in the virtual visit: patient, provider.   I discussed the limitations, risks, security and privacy concerns of performing an evaluation and management service by telephone and the availability of in person appointments. I also discussed with the patient that there may be a patient responsible charge related to this service. The patient expressed understanding and verbally consented to this telephonic visit.    Interactive audio and video telecommunications were attempted between this provider and patient, however failed, due to patient having technical difficulties OR patient did not have access to video capability.  We continued and completed visit with audio only.  Some vital signs may be absent or patient reported.   Time Spent with patient on telephone encounter: 30 minutes  Subjective:   Diana Watson is a 65 y.o. female who presents for Medicare Annual (Subsequent) preventive examination.  Review of Systems     Cardiac Risk Factors include: advanced age (>46men, >63 women);diabetes mellitus;dyslipidemia;hypertension;family history of premature cardiovascular disease     Objective:    There were no vitals filed for this visit. There is no height or weight on file to calculate BMI.  Advanced Directives 11/21/2020 11/19/2019 10/13/2018 08/23/2017 05/23/2016 06/09/2014 04/29/2011  Does Patient Have a Medical Advance Directive? No No No No No No Patient does not have advance directive  Would patient like information on creating a medical advance directive? Yes (MAU/Ambulatory/Procedural Areas - Information given) Yes (MAU/Ambulatory/Procedural Areas - Information given) Yes (ED - Information included in AVS) Yes (ED - Information included in AVS) - No - patient declined information -   Pre-existing out of facility DNR order (yellow form or pink MOST form) - - - - - - No  Some encounter information is confidential and restricted. Go to Review Flowsheets activity to see all data.    Current Medications (verified) Outpatient Encounter Medications as of 11/21/2020  Medication Sig   ACCU-CHEK AVIVA PLUS test strip CHECK BLOOD SUGAR TWICE DAILY   Alcohol Swabs PADS Use daily   amitriptyline (ELAVIL) 25 MG tablet    aspirin 81 MG tablet Take 81 mg by mouth daily.   buPROPion (WELLBUTRIN XL) 300 MG 24 hr tablet Take 300 mg by mouth daily.   busPIRone (BUSPAR) 10 MG tablet Take 10 mg by mouth in the morning, at noon, and at bedtime.   Calcium Citrate-Vitamin D (CALCIUM CITRATE + PO) Take by mouth.   DOCUSATE SODIUM PO Take 400 mg by mouth.    hydrocortisone 1 % ointment Apply 1 application topically 2 (two) times daily.   lactulose (CHRONULAC) 10 GM/15ML solution Take 45 mLs (30 g total) by mouth daily as needed for mild constipation.   lamoTRIgine (LAMICTAL) 200 MG tablet Take 200 mg by mouth every morning. For mood control   lisinopril (ZESTRIL) 30 MG tablet TAKE 2 TABLETS EVERY DAY   loperamide (IMODIUM) 2 MG capsule Take 6 mg by mouth as needed for diarrhea or loose stools.   memantine (NAMENDA) 10 MG tablet Take 1 tablet (10 mg total) by mouth 2 (two) times daily. (Patient not taking: No sig reported)   metroNIDAZOLE (METROGEL) 0.75 % gel Apply 1 application topically 2 (two) times daily.   Multiple Vitamin (MULTIVITAMIN WITH MINERALS) TABS Take 1 tablet by mouth every morning.    naproxen (NAPROSYN) 500 MG tablet Take 500 mg by mouth 3 (three)  times daily as needed. For pain   rosuvastatin (CRESTOR) 5 MG tablet TAKE 1 TABLET EVERY DAY   ziprasidone (GEODON) 80 MG capsule Take 160 mg by mouth at bedtime.    No facility-administered encounter medications on file as of 11/21/2020.    Allergies (verified) Prednisone   History: Past Medical History:  Diagnosis Date    Bipolar 1 disorder (HCC)    Constipation    Depression    Diabetes mellitus    Herniated disc    Hyperlipidemia    Hypertension    Neurocognitive disorder    major neurocognitive disorder   Personality disorder [place] [facility])    Past Surgical History:  Procedure Laterality Date   DILATION AND CURETTAGE OF UTERUS     RIGHT OOPHORECTOMY     Also removed a tumor at that time.   Family History  Problem Relation Age of Onset   Diabetes type II Mother    Hypertension Mother    OCD Mother    Bipolar disorder Mother    Hyperlipidemia Mother    Diabetes type II Father    Hypertension Father    Prostate cancer Father    Hyperlipidemia Father    Bipolar disorder Cousin    Pancreatic cancer Brother    Colon cancer Neg Hx    Social History   Socioeconomic History   Marital status: Married    Spouse name: Not on file   Number of children: 0   Years of education: some college   Highest education level: Not on file  Occupational History   Occupation: disability  Tobacco Use   Smoking status: Never   Smokeless tobacco: Never  Vaping Use   Vaping Use: Never used  Substance and Sexual Activity   Alcohol use: No    Alcohol/week: 6.0 - 12.0 standard drinks    Types: 6 - 12 Standard drinks or equivalent per week   Drug use: Never   Sexual activity: Yes    Birth control/protection: None  Other Topics Concern   Not on file  Social History Narrative   Lives at home with husband.   Right-handed.   3-4 cups caffeine per day.   Social Determinants of Health   Financial Resource Strain: Low Risk    Difficulty of Paying Living Expenses: Not hard at all  Food Insecurity: No Food Insecurity   Worried About Programme researcher, broadcasting/film/video in the Last Year: Never true   Ran Out of Food in the Last Year: Never true  Transportation Needs: No Transportation Needs   Lack of Transportation (Medical): No   Lack of Transportation (Non-Medical): No  Physical Activity: Sufficiently Active   Days of Exercise  per Week: 5 days   Minutes of Exercise per Session: 30 min  Stress: No Stress Concern Present   Feeling of Stress : Not at all  Social Connections: Socially Isolated   Frequency of Communication with Friends and Family: Never   Frequency of Social Gatherings with Friends and Family: Never   Attends Religious Services: Never   Database administrator or Organizations: No   Attends Engineer, structural: Never   Marital Status: Married    Tobacco Counseling Counseling given: Not Answered   Clinical Intake:  Pre-visit preparation completed: Yes  Pain : No/denies pain     Nutritional Risks: None Diabetes: Yes CBG done?: No (90 fasting) Did pt. bring in CBG monitor from home?: No  How often do you need to have someone help you when you  read instructions, pamphlets, or other written materials from your doctor or pharmacy?: 1 - Never What is the last grade level you completed in school?: 2 years of college  Diabetic? yes  Interpreter Needed?: No  Information entered by :: Susie Cassette, LPN.   Activities of Daily Living In your present state of health, do you have any difficulty performing the following activities: 11/21/2020  Hearing? N  Vision? N  Difficulty concentrating or making decisions? Y  Walking or climbing stairs? N  Dressing or bathing? N  Doing errands, shopping? Y  Preparing Food and eating ? N  Using the Toilet? N  In the past six months, have you accidently leaked urine? N  Do you have problems with loss of bowel control? N  Managing your Medications? N  Managing your Finances? N  Housekeeping or managing your Housekeeping? N  Some recent data might be hidden    Patient Care Team: Myrlene Broker, MD as PCP - General (Internal Medicine) Readling, Curlene Labrum, MD as Consulting Physician (Psychiatry) Van Clines, MD as Consulting Physician (Neurology) Kathyrn Sheriff, [facility] as Pharmacist (Pharmacist)  Indicate any recent Medical  Services you may have received from other than [facility] providers in the past year (date may be approximate).     Assessment:   This is a routine wellness examination for Diana Watson.  Hearing/Vision screen Hearing Screening - Comments:: Patient denied any hearing difficulty. Vision Screening - Comments:: Patient wears bi-focals.  Eye exams done annually by Dr. Jethro Bolus.  Dietary issues and exercise activities discussed: Current Exercise Habits: Home exercise routine, Type of exercise: walking;stretching;strength training/weights;Other - see comments (stair climbing), Time (Minutes): 30, Frequency (Times/Week): 5, Weekly Exercise (Minutes/Week): 150, Intensity: Moderate, Exercise limited by: neurologic condition(s);psychological condition(s)   Goals Addressed               This Visit's Progress     Patient Stated (pt-stated)        I want to continue losing my weight and control and lower my HgA1C to 5.5%      Depression Screen PHQ 2/9 Scores 11/21/2020 07/22/2020 11/19/2019 10/13/2018 08/23/2017  PHQ - 2 Score 0 0 1 1 2   PHQ- 9 Score - - - 1 4    Fall Risk Fall Risk  11/21/2020 08/15/2020 07/22/2020 11/19/2019 10/13/2018  Falls in the past year? 1 0 1 0 0  Number falls in past yr: 1 0 0 0 0  Injury with Fall? 0 0 0 0 -  Risk for fall due to : History of fall(s) No Fall Risks - No Fall Risks History of fall(s)  Follow up Falls evaluation completed Falls evaluation completed - Falls evaluation completed;Education provided -    FALL RISK PREVENTION PERTAINING TO THE HOME:  Any stairs in or around the home? Yes  If so, are there any without handrails? No  Home free of loose throw rugs in walkways, pet beds, electrical cords, etc? Yes  Adequate lighting in your home to reduce risk of falls? Yes   ASSISTIVE DEVICES UTILIZED TO PREVENT FALLS:  Life alert? No  Use of a cane, walker or w/c? No  Grab bars in the bathroom? No  Shower chair or bench in shower? No  Elevated toilet seat or a  handicapped toilet? No   TIMED UP AND GO:  Was the test performed? No .  Length of time to ambulate 10 feet: n/a sec.   Gait steady and fast without use of assistive device (per  patient)  Cognitive Function: Patient has current diagnosis of mild cognitive impairment.   MMSE - Mini Mental State Exam 12/24/2019 11/19/2019  Not completed: - Unable to complete  Orientation to time 5 -  Orientation to Place 5 -  Registration 3 -  Attention/ Calculation 5 -  Recall 3 -  Language- name 2 objects 2 -  Language- repeat 1 -  Language- follow 3 step command 3 -  Language- read & follow direction 1 -  Write a sentence 1 -  Copy design 1 -  Total score 30 -   Montreal Cognitive Assessment  09/27/2016  Visuospatial/ Executive (0/5) 4  Naming (0/3) 3  Attention: Read list of digits (0/2) 2  Attention: Read list of letters (0/1) 1  Attention: Serial 7 subtraction starting at 100 (0/3) 3  Language: Repeat phrase (0/2) 2  Language : Fluency (0/1) 1  Abstraction (0/2) 2  Delayed Recall (0/5) 5  Orientation (0/6) 6  Total 29      Immunizations Immunization History  Administered Date(s) Administered   Influenza Split 12/07/2011   Influenza,inj,Quad PF,6+ Mos 12/11/2017, 12/09/2018   Influenza-Unspecified 12/21/2015, 01/14/2017, 01/14/2018, 12/26/2018, 02/01/2020   Moderna Sars-Covid-2 Vaccination 09/01/2019, 09/29/2019, 04/11/2020   Pneumococcal Polysaccharide-23 01/11/2008, 01/05/2016   Tdap 01/06/2012   Zoster, Live 10/12/2015    TDAP status: Up to date  Flu Vaccine status: Up to date  Pneumococcal vaccine status: Due, Education has been provided regarding the importance of this vaccine. Advised may receive this vaccine at local pharmacy or Health Dept. Aware to provide a copy of the vaccination record if obtained from local pharmacy or Health Dept. Verbalized acceptance and understanding.  Covid-19 vaccine status: Completed vaccines  Qualifies for Shingles Vaccine? Yes    Zostavax completed Yes   Shingrix Completed?: No.    Education has been provided regarding the importance of this vaccine. Patient has been advised to call insurance company to determine out of pocket expense if they have not yet received this vaccine. Advised may also receive vaccine at local pharmacy or Health Dept. Verbalized acceptance and understanding.  Screening Tests Health Maintenance  Topic Date Due   Zoster Vaccines- Shingrix (1 of 2) Never done   PAP SMEAR-Modifier  04/16/2017   OPHTHALMOLOGY EXAM  01/12/2020   COVID-19 Vaccine (4 - Booster for Moderna series) 08/09/2020   INFLUENZA VACCINE  10/31/2020   FOOT EXAM  04/12/2021   HEMOGLOBIN A1C  05/18/2021   TETANUS/TDAP  01/05/2022   MAMMOGRAM  03/10/2022   COLONOSCOPY (Pts 45-50yrs Insurance coverage will need to be confirmed)  06/07/2026   PNEUMOCOCCAL POLYSACCHARIDE VACCINE AGE 65-64 HIGH RISK  Completed   Hepatitis C Screening  Completed   HIV Screening  Completed   Pneumococcal Vaccine 33-63 Years old  Aged Out   HPV VACCINES  Aged Out    Health Maintenance  Health Maintenance Due  Topic Date Due   Zoster Vaccines- Shingrix (1 of 2) Never done   PAP SMEAR-Modifier  04/16/2017   OPHTHALMOLOGY EXAM  01/12/2020   COVID-19 Vaccine (4 - Booster for Moderna series) 08/09/2020   INFLUENZA VACCINE  10/31/2020    Colorectal cancer screening: Type of screening: Colonoscopy. Completed 06/06/2016. Repeat every 10 years  Mammogram status: Completed 03/10/2020. Repeat every year  Bone density status: never done  Lung Cancer Screening: (Low Dose CT Chest recommended if Age 32-80 years, 30 pack-year currently smoking OR have quit w/in 15years.) does not qualify.   Lung Cancer Screening Referral: no  Additional Screening:  Hepatitis C Screening: does qualify; Completed yes  Vision Screening: Recommended annual ophthalmology exams for early detection of glaucoma and other disorders of the eye. Is the patient up to date  with their annual eye exam?  Yes  Who is the provider or what is the name of the office in which the patient attends annual eye exams? Jethro BolusMark Shapiro, MD. If pt is not established with a provider, would they like to be referred to a provider to establish care? No .   Dental Screening: Recommended annual dental exams for proper oral hygiene  Community Resource Referral / Chronic Care Management: CRR required this visit?  No   CCM required this visit?  No      Plan:     I have personally reviewed and noted the following in the patient's chart:   Medical and social history Use of alcohol, tobacco or illicit drugs  Current medications and supplements including opioid prescriptions.  Functional ability and status Nutritional status Physical activity Advanced directives List of other physicians Hospitalizations, surgeries, and ER visits in previous 12 months Vitals Screenings to include cognitive, depression, and falls Referrals and appointments  In addition, I have reviewed and discussed with patient certain preventive protocols, quality metrics, and best practice recommendations. A written personalized care plan for preventive services as well as general preventive health recommendations were provided to patient.     Diana NeedyShenika N Gale Watson, [place]   [id number]/95628/22/2022   Nurse Notes:  There were no vitals filed for this visit. There is no height or weight on file to calculate BMI. Patient stated that she has no issues with gait or balance; does not use any assistive devices.

## 2020-11-23 DIAGNOSIS — F314 Bipolar disorder, current episode depressed, severe, without psychotic features: Secondary | ICD-10-CM | POA: Diagnosis not present

## 2020-11-23 DIAGNOSIS — F419 Anxiety disorder, unspecified: Secondary | ICD-10-CM | POA: Diagnosis not present

## 2020-11-30 ENCOUNTER — Ambulatory Visit (INDEPENDENT_AMBULATORY_CARE_PROVIDER_SITE_OTHER): Payer: Medicare HMO | Admitting: Internal Medicine

## 2020-11-30 ENCOUNTER — Encounter: Payer: Self-pay | Admitting: Internal Medicine

## 2020-11-30 ENCOUNTER — Other Ambulatory Visit: Payer: Self-pay

## 2020-11-30 VITALS — BP 122/60 | HR 69 | Temp 97.6°F | Resp 18 | Ht 65.0 in | Wt 130.8 lb

## 2020-11-30 DIAGNOSIS — E1169 Type 2 diabetes mellitus with other specified complication: Secondary | ICD-10-CM | POA: Diagnosis not present

## 2020-11-30 DIAGNOSIS — R296 Repeated falls: Secondary | ICD-10-CM | POA: Diagnosis not present

## 2020-11-30 NOTE — Patient Instructions (Addendum)
We will get you in with the physical therapy for balance.  Try yoga or tai chi or a balance class at the gym.

## 2020-11-30 NOTE — Assessment & Plan Note (Signed)
No neuropathy and is controlled on diet recently so not likely related to the falls.

## 2020-11-30 NOTE — Progress Notes (Signed)
   Subjective:   Patient ID: Diana Watson, female    DOB: [birth year], 65 y.o.   MRN: [id number]  HPI The patient is a 65 YO female coming in for concerns about balance. Has fallen twice recently. Denies serious injury or LOC with falls. Once tripped and once missed a step and fell. She is concerned as it seems to be a progression with 1 fall last year and 2 so far this year. Wants to be proactive.   Review of Systems  Constitutional: Negative.   HENT: Negative.    Eyes: Negative.   Respiratory:  Negative for cough, chest tightness and shortness of breath.   Cardiovascular:  Negative for chest pain, palpitations and leg swelling.  Gastrointestinal:  Negative for abdominal distention, abdominal pain, constipation, diarrhea, nausea and vomiting.  Musculoskeletal: Negative.   Skin: Negative.   Neurological: Negative.   Psychiatric/Behavioral: Negative.     Objective:  Physical Exam Constitutional:      Appearance: She is well-developed.  HENT:     Head: Normocephalic and atraumatic.  Cardiovascular:     Rate and Rhythm: Normal rate and regular rhythm.  Pulmonary:     Effort: Pulmonary effort is normal. No respiratory distress.     Breath sounds: Normal breath sounds. No wheezing or rales.  Abdominal:     General: Bowel sounds are normal. There is no distension.     Palpations: Abdomen is soft.     Tenderness: There is no abdominal tenderness. There is no rebound.  Musculoskeletal:     Cervical back: Normal range of motion.  Skin:    General: Skin is warm and dry.  Neurological:     Mental Status: She is alert and oriented to person, place, and time.     Coordination: Coordination normal.    Vitals:   11/30/20 0800  BP: 122/60  Pulse: 69  Resp: 18  Temp: 97.6 F (36.4 C)  TempSrc: Oral  SpO2: 99%  Weight: 130 lb 12.8 oz (59.3 kg)  Height: 5\' 5"  (1.651 m)    This visit occurred during the SARS-CoV-2 public health emergency.  Safety protocols were in place,  including screening questions prior to the visit, additional usage of staff PPE, and extensive cleaning of exam room while observing appropriate contact time as indicated for disinfecting solutions.   Assessment & Plan:

## 2020-11-30 NOTE — Assessment & Plan Note (Signed)
Referral to neuro rehab to work on core strength. She does have some neurocognitive disorder and needs 1 on 1 training. Advised once this is done she could consider yoga or tai chi at the gym.

## 2021-01-19 DIAGNOSIS — H52203 Unspecified astigmatism, bilateral: Secondary | ICD-10-CM | POA: Diagnosis not present

## 2021-01-19 DIAGNOSIS — H25813 Combined forms of age-related cataract, bilateral: Secondary | ICD-10-CM | POA: Diagnosis not present

## 2021-01-19 DIAGNOSIS — E1136 Type 2 diabetes mellitus with diabetic cataract: Secondary | ICD-10-CM | POA: Diagnosis not present

## 2021-01-19 DIAGNOSIS — H5203 Hypermetropia, bilateral: Secondary | ICD-10-CM | POA: Diagnosis not present

## 2021-01-19 DIAGNOSIS — H524 Presbyopia: Secondary | ICD-10-CM | POA: Diagnosis not present

## 2021-01-30 ENCOUNTER — Telehealth: Payer: Self-pay | Admitting: Internal Medicine

## 2021-01-30 NOTE — Telephone Encounter (Signed)
Patient calling to request a [facility]  Patient states she was referred in aug of 2022 but never went  Patient requesting a call back

## 2021-01-30 NOTE — Telephone Encounter (Signed)
If she was referred previously that should not be expired she should be able to call there and start.

## 2021-01-31 NOTE — Telephone Encounter (Signed)
Called pt. LVM with Crawford's recommendations. Office number was provided.

## 2021-02-13 DIAGNOSIS — F314 Bipolar disorder, current episode depressed, severe, without psychotic features: Secondary | ICD-10-CM | POA: Diagnosis not present

## 2021-02-13 DIAGNOSIS — F419 Anxiety disorder, unspecified: Secondary | ICD-10-CM | POA: Diagnosis not present

## 2021-03-08 ENCOUNTER — Other Ambulatory Visit: Payer: Self-pay | Admitting: Internal Medicine

## 2021-03-15 ENCOUNTER — Other Ambulatory Visit: Payer: Self-pay

## 2021-03-15 ENCOUNTER — Encounter: Payer: Self-pay | Admitting: Internal Medicine

## 2021-03-15 ENCOUNTER — Ambulatory Visit (INDEPENDENT_AMBULATORY_CARE_PROVIDER_SITE_OTHER): Payer: Medicare HMO | Admitting: Internal Medicine

## 2021-03-15 VITALS — BP 124/88 | HR 72 | Resp 18 | Ht 65.0 in | Wt 154.2 lb

## 2021-03-15 DIAGNOSIS — E1169 Type 2 diabetes mellitus with other specified complication: Secondary | ICD-10-CM

## 2021-03-15 LAB — POCT GLYCOSYLATED HEMOGLOBIN (HGB A1C): Hemoglobin A1C: 5.9 % — AB (ref 4.0–5.6)

## 2021-03-15 NOTE — Progress Notes (Signed)
° °  Subjective:   Patient ID: Diana Watson, female    DOB: 08-25-1955, 65 y.o.   MRN: [id number]  HPI The patient is a 65 YO female coming in for follow up sugars.  HgA1c 5.9  Review of Systems  Constitutional: Negative.   HENT: Negative.    Eyes: Negative.   Respiratory:  Negative for cough, chest tightness and shortness of breath.   Cardiovascular:  Negative for chest pain, palpitations and leg swelling.  Gastrointestinal:  Negative for abdominal distention, abdominal pain, constipation, diarrhea, nausea and vomiting.  Musculoskeletal: Negative.   Skin: Negative.   Neurological: Negative.   Psychiatric/Behavioral: Negative.     Objective:  Physical Exam Constitutional:      Appearance: She is well-developed.  HENT:     Head: Normocephalic and atraumatic.  Cardiovascular:     Rate and Rhythm: Normal rate and regular rhythm.  Pulmonary:     Effort: Pulmonary effort is normal. No respiratory distress.     Breath sounds: Normal breath sounds. No wheezing or rales.  Abdominal:     General: Bowel sounds are normal. There is no distension.     Palpations: Abdomen is soft.     Tenderness: There is no abdominal tenderness. There is no rebound.  Musculoskeletal:     Cervical back: Normal range of motion.  Skin:    General: Skin is warm and dry.  Neurological:     Mental Status: She is alert and oriented to person, place, and time.     Coordination: Coordination normal.    Vitals:   03/15/21 0843  BP: 124/88  Pulse: 72  Resp: 18  SpO2: 94%  Weight: 154 lb 3.2 oz (69.9 kg)  Height: 5\' 5"  (1.651 m)    This visit occurred during the SARS-CoV-2 public health emergency.  Safety protocols were in place, including screening questions prior to the visit, additional usage of staff PPE, and extensive cleaning of exam room while observing appropriate contact time as indicated for disinfecting solutions.   Assessment & Plan:

## 2021-03-15 NOTE — Assessment & Plan Note (Signed)
POC HgA1c done today. She needs monitoring every 3 months due to high risk medications for mental health. She is currently controlled without medications. Hga1c 5.9 today which is stable. Continue with diet and exercise and avoid weight gain.

## 2021-03-15 NOTE — Patient Instructions (Signed)
Your HgA1c is 5.9 today.

## 2021-04-29 ENCOUNTER — Other Ambulatory Visit: Payer: Self-pay | Admitting: Internal Medicine

## 2021-05-10 DIAGNOSIS — F314 Bipolar disorder, current episode depressed, severe, without psychotic features: Secondary | ICD-10-CM | POA: Diagnosis not present

## 2021-05-10 DIAGNOSIS — F419 Anxiety disorder, unspecified: Secondary | ICD-10-CM | POA: Diagnosis not present

## 2021-05-25 DIAGNOSIS — F419 Anxiety disorder, unspecified: Secondary | ICD-10-CM | POA: Diagnosis not present

## 2021-05-25 DIAGNOSIS — F3131 Bipolar disorder, current episode depressed, mild: Secondary | ICD-10-CM | POA: Diagnosis not present

## 2021-05-31 ENCOUNTER — Encounter: Payer: Self-pay | Admitting: Internal Medicine

## 2021-05-31 ENCOUNTER — Other Ambulatory Visit: Payer: Self-pay

## 2021-05-31 ENCOUNTER — Ambulatory Visit (INDEPENDENT_AMBULATORY_CARE_PROVIDER_SITE_OTHER): Payer: Medicare HMO | Admitting: Internal Medicine

## 2021-05-31 VITALS — BP 122/90 | HR 87 | Resp 18 | Ht 65.0 in | Wt 160.4 lb

## 2021-05-31 DIAGNOSIS — E1169 Type 2 diabetes mellitus with other specified complication: Secondary | ICD-10-CM

## 2021-05-31 DIAGNOSIS — R202 Paresthesia of skin: Secondary | ICD-10-CM | POA: Diagnosis not present

## 2021-05-31 DIAGNOSIS — F0390 Unspecified dementia without behavioral disturbance: Secondary | ICD-10-CM | POA: Diagnosis not present

## 2021-05-31 DIAGNOSIS — R200 Anesthesia of skin: Secondary | ICD-10-CM

## 2021-05-31 DIAGNOSIS — I10 Essential (primary) hypertension: Secondary | ICD-10-CM | POA: Diagnosis not present

## 2021-05-31 DIAGNOSIS — R7989 Other specified abnormal findings of blood chemistry: Secondary | ICD-10-CM

## 2021-05-31 LAB — T4, FREE: Free T4: 0.6 ng/dL (ref 0.60–1.60)

## 2021-05-31 LAB — CBC
HCT: 37.7 % (ref 36.0–46.0)
Hemoglobin: 12.3 g/dL (ref 12.0–15.0)
MCHC: 32.7 g/dL (ref 30.0–36.0)
MCV: 97.5 fl (ref 78.0–100.0)
Platelets: 196 10*3/uL (ref 150.0–400.0)
RBC: 3.87 Mil/uL (ref 3.87–5.11)
RDW: 13.3 % (ref 11.5–15.5)
WBC: 4.3 10*3/uL (ref 4.0–10.5)

## 2021-05-31 LAB — VITAMIN B12: Vitamin B-12: 607 pg/mL (ref 211–911)

## 2021-05-31 LAB — VITAMIN D 25 HYDROXY (VIT D DEFICIENCY, FRACTURES): VITD: 43.73 ng/mL (ref 30.00–100.00)

## 2021-05-31 LAB — COMPREHENSIVE METABOLIC PANEL
ALT: 45 U/L — ABNORMAL HIGH (ref 0–35)
AST: 36 U/L (ref 0–37)
Albumin: 4.7 g/dL (ref 3.5–5.2)
Alkaline Phosphatase: 74 U/L (ref 39–117)
BUN: 18 mg/dL (ref 6–23)
CO2: 30 mEq/L (ref 19–32)
Calcium: 9.6 mg/dL (ref 8.4–10.5)
Chloride: 101 mEq/L (ref 96–112)
Creatinine, Ser: 1.06 mg/dL (ref 0.40–1.20)
GFR: 55.72 mL/min — ABNORMAL LOW (ref >60.00)
Glucose, Bld: 100 mg/dL — ABNORMAL HIGH (ref 70–99)
Potassium: 4.8 mEq/L (ref 3.5–5.1)
Sodium: 137 mEq/L (ref 135–145)
Total Bilirubin: 0.3 mg/dL (ref 0.2–1.2)
Total Protein: 8.1 g/dL (ref 6.0–8.3)

## 2021-05-31 LAB — MAGNESIUM: Magnesium: 2.1 mg/dL (ref 1.5–2.5)

## 2021-05-31 LAB — TSH: TSH: 1.92 u[IU]/mL (ref 0.35–5.50)

## 2021-05-31 NOTE — Progress Notes (Signed)
? ?  Subjective:  ? ?Patient ID: Carolee Channell, female    DOB: 1955-11-02, 66 y.o.   MRN: [id number] ? ?HPI ?The patient is a 66 YO female coming in for concerns. ? ?Review of Systems  ?Constitutional: Negative.   ?HENT: Negative.    ?Eyes: Negative.   ?Respiratory:  Negative for cough, chest tightness and shortness of breath.   ?Cardiovascular:  Negative for chest pain, palpitations and leg swelling.  ?Gastrointestinal:  Negative for abdominal distention, abdominal pain, constipation, diarrhea, nausea and vomiting.  ?Musculoskeletal: Negative.   ?Skin: Negative.   ?Neurological:  Positive for numbness.  ?Psychiatric/Behavioral: Negative.    ? ?Objective:  ?Physical Exam ?Constitutional:   ?   Appearance: She is well-developed.  ?HENT:  ?   Head: Normocephalic and atraumatic.  ?Cardiovascular:  ?   Rate and Rhythm: Normal rate and regular rhythm.  ?Pulmonary:  ?   Effort: Pulmonary effort is normal. No respiratory distress.  ?   Breath sounds: Normal breath sounds. No wheezing or rales.  ?Abdominal:  ?   General: Bowel sounds are normal. There is no distension.  ?   Palpations: Abdomen is soft.  ?   Tenderness: There is no abdominal tenderness. There is no rebound.  ?Musculoskeletal:  ?   Cervical back: Normal range of motion.  ?Skin: ?   General: Skin is warm and dry.  ?Neurological:  ?   Mental Status: She is alert and oriented to person, place, and time.  ?   Coordination: Coordination normal.  ? ? ?Vitals:  ? 05/31/21 0823  ?BP: 122/90  ?Pulse: 87  ?Resp: 18  ?SpO2: 96%  ?Weight: 160 lb 6.4 oz (72.8 kg)  ?Height: 5\' 5"  (1.651 m)  ? ? ?This visit occurred during the SARS-CoV-2 public health emergency.  Safety protocols were in place, including screening questions prior to the visit, additional usage of staff PPE, and extensive cleaning of exam room while observing appropriate contact time as indicated for disinfecting solutions.  ? ?Assessment & Plan:  ? ?

## 2021-05-31 NOTE — Patient Instructions (Signed)
We will check the labs,. Try a carpal tunnel brace if you keep having this. ?

## 2021-06-01 ENCOUNTER — Encounter: Payer: Self-pay | Admitting: Internal Medicine

## 2021-06-01 DIAGNOSIS — R200 Anesthesia of skin: Secondary | ICD-10-CM | POA: Insufficient documentation

## 2021-06-01 NOTE — Assessment & Plan Note (Signed)
Checking TSH and free T4 and B12, CBC, CMP, vitamin D, magnesium. Adjust as needed. Suspect compression as symptoms are only while sleeping. ?

## 2021-06-01 NOTE — Assessment & Plan Note (Signed)
Checking TSH and free T4 and adjust as needed. Not on medication currently and new tingling right hand. ?

## 2021-06-01 NOTE — Assessment & Plan Note (Signed)
BP is borderline today and will monitor. She has had normal readings at home consistently. Taking lisinopril 60 mg daily and checking CMP due to the high dosing of this it needs frequent monitoring of renal function and K levels. Adjust as needed.  ?

## 2021-06-01 NOTE — Assessment & Plan Note (Addendum)
Refer to psychology she is overeating due to emotional needs and wishes counseling. Referral placed. Her sugar levels are now running higher in the morning due to night time eating. She has benefited from counseling before. She does not want to talk to nutritionist as she has met with them before and feels confident in the information. She is currently diet controlled at goal. Is taking lisinopril and crestor. Foot exam up to date and reminded about yearly eye exam. ?

## 2021-06-01 NOTE — Assessment & Plan Note (Signed)
Wants referral to counseling so she can work on her habit of overeating due to emotional feelings. Referral placed. ?

## 2021-06-12 DIAGNOSIS — F3131 Bipolar disorder, current episode depressed, mild: Secondary | ICD-10-CM | POA: Diagnosis not present

## 2021-06-12 DIAGNOSIS — F419 Anxiety disorder, unspecified: Secondary | ICD-10-CM | POA: Diagnosis not present

## 2021-06-26 ENCOUNTER — Other Ambulatory Visit: Payer: Self-pay

## 2021-06-26 ENCOUNTER — Ambulatory Visit (INDEPENDENT_AMBULATORY_CARE_PROVIDER_SITE_OTHER): Payer: Medicare HMO | Admitting: Internal Medicine

## 2021-06-26 ENCOUNTER — Encounter: Payer: Self-pay | Admitting: Internal Medicine

## 2021-06-26 VITALS — BP 122/80 | HR 94 | Resp 18 | Ht 65.0 in | Wt 167.4 lb

## 2021-06-26 DIAGNOSIS — Z0000 Encounter for general adult medical examination without abnormal findings: Secondary | ICD-10-CM

## 2021-06-26 DIAGNOSIS — E785 Hyperlipidemia, unspecified: Secondary | ICD-10-CM

## 2021-06-26 DIAGNOSIS — E1169 Type 2 diabetes mellitus with other specified complication: Secondary | ICD-10-CM

## 2021-06-26 DIAGNOSIS — I10 Essential (primary) hypertension: Secondary | ICD-10-CM | POA: Diagnosis not present

## 2021-06-26 LAB — COMPREHENSIVE METABOLIC PANEL
ALT: 50 U/L — ABNORMAL HIGH (ref 0–35)
AST: 38 U/L — ABNORMAL HIGH (ref 0–37)
Albumin: 4.8 g/dL (ref 3.5–5.2)
Alkaline Phosphatase: 85 U/L (ref 39–117)
BUN: 17 mg/dL (ref 6–23)
CO2: 31 mEq/L (ref 19–32)
Calcium: 9.9 mg/dL (ref 8.4–10.5)
Chloride: 101 mEq/L (ref 96–112)
Creatinine, Ser: 1.11 mg/dL (ref 0.40–1.20)
GFR: 52.69 mL/min — ABNORMAL LOW (ref >60.00)
Glucose, Bld: 117 mg/dL — ABNORMAL HIGH (ref 70–99)
Potassium: 4.7 mEq/L (ref 3.5–5.1)
Sodium: 138 mEq/L (ref 135–145)
Total Bilirubin: 0.2 mg/dL (ref 0.2–1.2)
Total Protein: 8.2 g/dL (ref 6.0–8.3)

## 2021-06-26 LAB — HEMOGLOBIN A1C: Hgb A1c MFr Bld: 6.6 % — ABNORMAL HIGH (ref 4.6–6.5)

## 2021-06-26 LAB — LIPID PANEL
Cholesterol: 127 mg/dL (ref 0–200)
HDL: 73.4 mg/dL (ref >39.00)
LDL Cholesterol: 40 mg/dL (ref 0–99)
NonHDL: 53.57
Total CHOL/HDL Ratio: 2
Triglycerides: 69 mg/dL (ref 0.0–149.0)
VLDL: 13.8 mg/dL (ref 0.0–40.0)

## 2021-06-26 LAB — MICROALBUMIN / CREATININE URINE RATIO
Creatinine,U: 21 mg/dL
Microalb Creat Ratio: 8.8 mg/g (ref 0.0–30.0)
Microalb, Ur: 1.8 mg/dL (ref 0.0–1.9)

## 2021-06-26 NOTE — Assessment & Plan Note (Signed)
Checking HgA1c today and microalbumin to creatinine ratio. Getting records for eye exam and foot exam done. Is diet controlled recently but is having difficulty due to emotional stress and coping is having poor habits with food and exercise recently. May need adjustment. Is on ACE-I and statin. ?

## 2021-06-26 NOTE — Assessment & Plan Note (Signed)
BP at goal today. Taking lisinopril 60 mg daily and needs CMP for monitoring of K and kidney function. Adjust as needed.  ?

## 2021-06-26 NOTE — Assessment & Plan Note (Signed)
Checking lipid panel and adjust crestor 5 mg daily for goal LDL <100.  ?

## 2021-06-26 NOTE — Assessment & Plan Note (Signed)
Flu shot up to date. Covid-19 up to date. Pneumonia due 80. Shingrix complete per patient at pharmacy. Tetanus due end of 2023. Colonoscopy up to date. Mammogram up to date, pap smear up to date. Counseled about sun safety and mole surveillance. Counseled about the dangers of distracted driving. Given 10 year screening recommendations.  ? ?

## 2021-06-26 NOTE — Progress Notes (Signed)
? ?  Subjective:  ? ?Patient ID: Diana Watson, female    DOB: 1956-01-18, 66 y.o.   MRN: [id number] ? ?HPI ?The patient is here for physical. Dr. Nile Riggs ? ?PMH, [facility], social history reviewed and updated ? ?Review of Systems  ?Constitutional: Negative.   ?HENT: Negative.    ?Eyes: Negative.   ?Respiratory:  Negative for cough, chest tightness and shortness of breath.   ?Cardiovascular:  Negative for chest pain, palpitations and leg swelling.  ?Gastrointestinal:  Negative for abdominal distention, abdominal pain, constipation, diarrhea, nausea and vomiting.  ?Musculoskeletal: Negative.   ?Skin: Negative.   ?Neurological: Negative.   ?Psychiatric/Behavioral: Negative.    ? ?Objective:  ?Physical Exam ?Constitutional:   ?   Appearance: She is well-developed.  ?HENT:  ?   Head: Normocephalic and atraumatic.  ?Cardiovascular:  ?   Rate and Rhythm: Normal rate and regular rhythm.  ?Pulmonary:  ?   Effort: Pulmonary effort is normal. No respiratory distress.  ?   Breath sounds: Normal breath sounds. No wheezing or rales.  ?Abdominal:  ?   General: Bowel sounds are normal. There is no distension.  ?   Palpations: Abdomen is soft.  ?   Tenderness: There is no abdominal tenderness. There is no rebound.  ?Musculoskeletal:  ?   Cervical back: Normal range of motion.  ?Skin: ?   General: Skin is warm and dry.  ?   Comments: Foot exam done.  ?Neurological:  ?   Mental Status: She is alert and oriented to person, place, and time.  ?   Coordination: Coordination normal.  ? ? ?Vitals:  ? 06/26/21 0756  ?BP: 122/80  ?Pulse: 94  ?Resp: 18  ?SpO2: 98%  ?Weight: 167 lb 6.4 oz (75.9 kg)  ?Height: 5\' 5"  (1.651 m)  ? ? ?This visit occurred during the SARS-CoV-2 public health emergency.  Safety protocols were in place, including screening questions prior to the visit, additional usage of staff PPE, and extensive cleaning of exam room while observing appropriate contact time as indicated for disinfecting solutions.  ? ?Assessment & Plan:   ? ?

## 2021-07-03 ENCOUNTER — Telehealth: Payer: Self-pay

## 2021-07-03 NOTE — Progress Notes (Signed)
? ? ?  Chronic Care Management ?Pharmacy Assistant  ? ?Name: Diana Watson  MRN: [id number] DOB: [birth year] ? ?Diana Watson is an 66 y.o. year old female who was called today to reschedule her for her initial CCM visit with the clinical pharmacist.Left message for patient to return call. ?Patient has appt set up for 09/18/21 @ 9:45am ? ? ?Velvet Bathe ?Clinical Pharmacist Assistant ?[phone]  ?

## 2021-07-18 DIAGNOSIS — F3112 Bipolar disorder, current episode manic without psychotic features, moderate: Secondary | ICD-10-CM | POA: Diagnosis not present

## 2021-08-01 DIAGNOSIS — F3112 Bipolar disorder, current episode manic without psychotic features, moderate: Secondary | ICD-10-CM | POA: Diagnosis not present

## 2021-08-08 DIAGNOSIS — F3112 Bipolar disorder, current episode manic without psychotic features, moderate: Secondary | ICD-10-CM | POA: Diagnosis not present

## 2021-08-10 DIAGNOSIS — F419 Anxiety disorder, unspecified: Secondary | ICD-10-CM | POA: Diagnosis not present

## 2021-08-10 DIAGNOSIS — F3131 Bipolar disorder, current episode depressed, mild: Secondary | ICD-10-CM | POA: Diagnosis not present

## 2021-08-30 DIAGNOSIS — F3112 Bipolar disorder, current episode manic without psychotic features, moderate: Secondary | ICD-10-CM | POA: Diagnosis not present

## 2021-09-06 ENCOUNTER — Other Ambulatory Visit: Payer: Self-pay | Admitting: Internal Medicine

## 2021-09-18 ENCOUNTER — Telehealth: Payer: Medicare HMO

## 2021-09-22 ENCOUNTER — Other Ambulatory Visit: Payer: Self-pay | Admitting: Internal Medicine

## 2021-10-25 ENCOUNTER — Other Ambulatory Visit: Payer: Self-pay | Admitting: Internal Medicine

## 2021-10-25 DIAGNOSIS — Z1231 Encounter for screening mammogram for malignant neoplasm of breast: Secondary | ICD-10-CM

## 2021-11-10 DIAGNOSIS — F3131 Bipolar disorder, current episode depressed, mild: Secondary | ICD-10-CM | POA: Diagnosis not present

## 2021-11-10 DIAGNOSIS — F419 Anxiety disorder, unspecified: Secondary | ICD-10-CM | POA: Diagnosis not present

## 2021-11-14 ENCOUNTER — Ambulatory Visit
Admission: RE | Admit: 2021-11-14 | Discharge: 2021-11-14 | Disposition: A | Discharge status: Home / Self Care | Payer: Medicare HMO | Source: Ambulatory Visit | Attending: Internal Medicine | Admitting: Internal Medicine

## 2021-11-14 DIAGNOSIS — Z1231 Encounter for screening mammogram for malignant neoplasm of breast: Secondary | ICD-10-CM | POA: Diagnosis not present

## 2021-11-17 ENCOUNTER — Encounter: Payer: Self-pay | Admitting: Internal Medicine

## 2021-11-17 ENCOUNTER — Ambulatory Visit (INDEPENDENT_AMBULATORY_CARE_PROVIDER_SITE_OTHER): Payer: Medicare HMO | Admitting: Internal Medicine

## 2021-11-17 VITALS — BP 118/80 | HR 69 | Temp 98.1°F | Ht 65.0 in | Wt 163.0 lb

## 2021-11-17 DIAGNOSIS — E1169 Type 2 diabetes mellitus with other specified complication: Secondary | ICD-10-CM

## 2021-11-17 LAB — POCT GLYCOSYLATED HEMOGLOBIN (HGB A1C): Hemoglobin A1C: 6.6 % — AB (ref 4.0–5.6)

## 2021-11-17 MED ORDER — HYDROCORTISONE 1 % EX OINT
1.0000 | TOPICAL_OINTMENT | Freq: Two times a day (BID) | CUTANEOUS | 0 refills | Status: DC
Start: 2021-11-17 — End: 2023-10-01

## 2021-11-17 MED ORDER — METRONIDAZOLE 0.75 % EX GEL: 1.0000 | Freq: Two times a day (BID) | CUTANEOUS | 3 refills | Status: DC

## 2021-11-17 MED ORDER — ACCU-CHEK AVIVA PLUS VI STRP: ORAL_STRIP | 3 refills | Status: DC

## 2021-11-17 NOTE — Assessment & Plan Note (Signed)
POC HGA1c done at 6.6 which is stable. She is disappointed as she was <6 for awhile previously. She is using food as coping skills a lot more recently and is also somewhat relieved it was not higher. She is planning to start counseling soon to help with coping skills other than food. Follow up 3 months for close monitoring. She is diet controlled currently.

## 2021-11-17 NOTE — Progress Notes (Signed)
   Subjective:   Patient ID: Diana Watson, female    DOB: 07-27-1955, 66 y.o.   MRN: [id number]  HPI The patient is a 66 YO female coming in for follow up.  Review of Systems  Constitutional: Negative.   HENT: Negative.    Eyes: Negative.   Respiratory:  Negative for cough, chest tightness and shortness of breath.   Cardiovascular:  Negative for chest pain, palpitations and leg swelling.  Gastrointestinal:  Negative for abdominal distention, abdominal pain, constipation, diarrhea, nausea and vomiting.  Musculoskeletal: Negative.   Skin: Negative.   Neurological: Negative.   Psychiatric/Behavioral: Negative.      Objective:  Physical Exam Constitutional:      Appearance: She is well-developed.  HENT:     Head: Normocephalic and atraumatic.  Cardiovascular:     Rate and Rhythm: Normal rate and regular rhythm.  Pulmonary:     Effort: Pulmonary effort is normal. No respiratory distress.     Breath sounds: Normal breath sounds. No wheezing or rales.  Abdominal:     General: Bowel sounds are normal. There is no distension.     Palpations: Abdomen is soft.     Tenderness: There is no abdominal tenderness. There is no rebound.  Musculoskeletal:     Cervical back: Normal range of motion.  Skin:    General: Skin is warm and dry.  Neurological:     Mental Status: She is alert and oriented to person, place, and time.     Coordination: Coordination normal.     Vitals:   11/17/21 0812  BP: 118/80  Pulse: 69  Temp: 98.1 F (36.7 C)  TempSrc: Oral  SpO2: 98%  Weight: 163 lb (73.9 kg)  Height: 5\' 5"  (1.651 m)    Assessment & Plan:

## 2021-11-17 NOTE — Patient Instructions (Signed)
Your HgA1c is 6.6

## 2021-11-22 ENCOUNTER — Ambulatory Visit: Payer: Medicare HMO

## 2021-11-24 ENCOUNTER — Ambulatory Visit (INDEPENDENT_AMBULATORY_CARE_PROVIDER_SITE_OTHER): Payer: Medicare HMO | Admitting: Internal Medicine

## 2021-11-24 ENCOUNTER — Encounter: Payer: Self-pay | Admitting: Internal Medicine

## 2021-11-24 DIAGNOSIS — E1169 Type 2 diabetes mellitus with other specified complication: Secondary | ICD-10-CM

## 2021-11-24 MED ORDER — FREESTYLE LIBRE 14 DAY READER DEVI: 0 refills | Status: DC

## 2021-11-24 MED ORDER — FREESTYLE LIBRE SENSOR SYSTEM MISC: 0 refills | Status: DC

## 2021-11-24 MED ORDER — FREESTYLE LIBRE 14 DAY SENSOR MISC
3 refills | Status: DC
Start: 2021-11-24 — End: 2022-08-14

## 2021-11-24 NOTE — Assessment & Plan Note (Signed)
Extensive discussion about sugars and diet and exercise and how they impact blood sugars as well as different foods. Rx freestyle libre to give continuous glucose monitoring as she has had a change in sugar levels without change in diet to help her understand eating patterns and sugar levels as well as activity level.

## 2021-11-24 NOTE — Progress Notes (Signed)
   Subjective:   Patient ID: Diana Watson, female    DOB: Oct 13, 1955, 66 y.o.   MRN: [id number]  HPI The patient is a 66 YO female coming in for noticing pattern change in her sugar readings.  Review of Systems  Constitutional: Negative.   HENT: Negative.    Eyes: Negative.   Respiratory:  Negative for cough, chest tightness and shortness of breath.   Cardiovascular:  Negative for chest pain, palpitations and leg swelling.  Gastrointestinal:  Negative for abdominal distention, abdominal pain, constipation, diarrhea, nausea and vomiting.  Musculoskeletal: Negative.   Skin: Negative.   Neurological: Negative.   Psychiatric/Behavioral: Negative.      Objective:  Physical Exam Constitutional:      Appearance: She is well-developed.  HENT:     Head: Normocephalic and atraumatic.  Cardiovascular:     Rate and Rhythm: Normal rate and regular rhythm.  Pulmonary:     Effort: Pulmonary effort is normal. No respiratory distress.     Breath sounds: Normal breath sounds. No wheezing or rales.  Abdominal:     General: Bowel sounds are normal. There is no distension.     Palpations: Abdomen is soft.     Tenderness: There is no abdominal tenderness. There is no rebound.  Musculoskeletal:     Cervical back: Normal range of motion.  Skin:    General: Skin is warm and dry.  Neurological:     Mental Status: She is alert and oriented to person, place, and time.     Coordination: Coordination normal.     Vitals:   11/24/21 0828  BP: 110/70  Pulse: 85  Temp: 98.7 F (37.1 C)  TempSrc: Oral  SpO2: 93%  Height: 5\' 5"  (1.651 m)    Assessment & Plan:  Visit time 15 minutes in face to face communication with patient and coordination of care, additional 5 minutes spent in record review, coordination or care, ordering tests, communicating/referring to other healthcare professionals, documenting in medical records all on the same day of the visit for total time 20 minutes spent on the  visit.

## 2021-11-27 ENCOUNTER — Ambulatory Visit (INDEPENDENT_AMBULATORY_CARE_PROVIDER_SITE_OTHER): Payer: Medicare HMO

## 2021-11-27 DIAGNOSIS — Z0000 Encounter for general adult medical examination without abnormal findings: Secondary | ICD-10-CM

## 2021-11-27 NOTE — Patient Instructions (Signed)
Diana Watson , Thank you for taking time to come for your Medicare Wellness Visit. I appreciate your ongoing commitment to your health goals. Please review the following plan we discussed and let me know if I can assist you in the future.   Screening recommendations/referrals: Colonoscopy: 06/06/2016; due every 10 years Mammogram: 11/14/2021; due every year Bone Density: never done Recommended yearly ophthalmology/optometry visit for glaucoma screening and checkup Recommended yearly dental visit for hygiene and checkup  Vaccinations: Influenza vaccine: 01/17/2021 Pneumococcal vaccine: 01/05/2016 Tdap vaccine: 01/06/2012; due every 10 years Shingles vaccine: need record  Covid-19: 09/01/2019, 09/29/2019, 04/11/2020, 01/18/2021  Advanced directives: No  Conditions/risks identified: Yes; Type II Diabetes  Next appointment: Please schedule your next Medicare Wellness Visit with your Nurse Health Advisor in 1 year by calling [phone].  Preventive Care 40-64 Years, Female Preventive care refers to lifestyle choices and visits with your health care provider that can promote health and wellness. What does preventive care include? A yearly physical exam. This is also called an annual well check. Dental exams once or twice a year. Routine eye exams. Ask your health care provider how often you should have your eyes checked. Personal lifestyle choices, including: Daily care of your teeth and gums. Regular physical activity. Eating a healthy diet. Avoiding tobacco and drug use. Limiting alcohol use. Practicing safe sex. Taking low-dose aspirin daily starting at age 93. Taking vitamin and mineral supplements as recommended by your health care provider. What happens during an annual well check? The services and screenings done by your health care provider during your annual well check will depend on your age, overall health, lifestyle risk factors, and family history of disease. Counseling   Your health care provider may ask you questions about your: Alcohol use. Tobacco use. Drug use. Emotional well-being. Home and relationship well-being. Sexual activity. Eating habits. Work and work Statistician. Method of birth control. Menstrual cycle. Pregnancy history. Screening  You may have the following tests or measurements: Height, weight, and BMI. Blood pressure. Lipid and cholesterol levels. These may be checked every 5 years, or more frequently if you are over 67 years old. Skin check. Lung cancer screening. You may have this screening every year starting at age 15 if you have a 30-pack-year history of smoking and currently smoke or have quit within the past 15 years. Fecal occult blood test (FOBT) of the stool. You may have this test every year starting at age 29. Flexible sigmoidoscopy or colonoscopy. You may have a sigmoidoscopy every 5 years or a colonoscopy every 10 years starting at age 57. Hepatitis C blood test. Hepatitis B blood test. Sexually transmitted disease (STD) testing. Diabetes screening. This is done by checking your blood sugar (glucose) after you have not eaten for a while (fasting). You may have this done every 1-3 years. Mammogram. This may be done every 1-2 years. Talk to your health care provider about when you should start having regular mammograms. This may depend on whether you have a family history of breast cancer. BRCA-related cancer screening. This may be done if you have a family history of breast, ovarian, tubal, or peritoneal cancers. Pelvic exam and Pap test. This may be done every 3 years starting at age 66. Starting at age 64, this may be done every 5 years if you have a Pap test in combination with an HPV test. Bone density scan. This is done to screen for osteoporosis. You may have this scan if you are at high risk for osteoporosis. Discuss  your test results, treatment options, and if necessary, the need for more tests with your health  care provider. Vaccines  Your health care provider may recommend certain vaccines, such as: Influenza vaccine. This is recommended every year. Tetanus, diphtheria, and acellular pertussis (Tdap, Td) vaccine. You may need a Td booster every 10 years. Zoster vaccine. You may need this after age 53. Pneumococcal 13-valent conjugate (PCV13) vaccine. You may need this if you have certain conditions and were not previously vaccinated. Pneumococcal polysaccharide (PPSV23) vaccine. You may need one or two doses if you smoke cigarettes or if you have certain conditions. Talk to your health care provider about which screenings and vaccines you need and how often you need them. This information is not intended to replace advice given to you by your health care provider. Make sure you discuss any questions you have with your health care provider. Document Released: 04/15/2015 Document Revised: 12/07/2015 Document Reviewed: 01/18/2015 Elsevier Interactive Patient Education  2017 [employer] Prevention in the Home Falls can cause injuries. They can happen to people of all ages. There are many things you can do to make your home safe and to help prevent falls. What can I do on the outside of my home? Regularly fix the edges of walkways and driveways and fix any cracks. Remove anything that might make you trip as you walk through a door, such as a raised step or threshold. Trim any bushes or trees on the path to your home. Use bright outdoor lighting. Clear any walking paths of anything that might make someone trip, such as rocks or tools. Regularly check to see if handrails are loose or broken. Make sure that both sides of any steps have handrails. Any raised decks and porches should have guardrails on the edges. Have any leaves, snow, or ice cleared regularly. Use sand or salt on walking paths during winter. Clean up any spills in your garage right away. This includes oil or grease  spills. What can I do in the bathroom? Use night lights. Install grab bars by the toilet and in the tub and shower. Do not use towel bars as grab bars. Use non-skid mats or decals in the tub or shower. If you need to sit down in the shower, use a plastic, non-slip stool. Keep the floor dry. Clean up any water that spills on the floor as soon as it happens. Remove soap buildup in the tub or shower regularly. Attach bath mats securely with double-sided non-slip rug tape. Do not have throw rugs and other things on the floor that can make you trip. What can I do in the bedroom? Use night lights. Make sure that you have a light by your bed that is easy to reach. Do not use any sheets or blankets that are too big for your bed. They should not hang down onto the floor. Have a firm chair that has side arms. You can use this for support while you get dressed. Do not have throw rugs and other things on the floor that can make you trip. What can I do in the kitchen? Clean up any spills right away. Avoid walking on wet floors. Keep items that you use a lot in easy-to-reach places. If you need to reach something above you, use a strong step stool that has a grab bar. Keep electrical cords out of the way. Do not use floor polish or wax that makes floors slippery. If you must use wax, use  non-skid floor wax. Do not have throw rugs and other things on the floor that can make you trip. What can I do with my stairs? Do not leave any items on the stairs. Make sure that there are handrails on both sides of the stairs and use them. Fix handrails that are broken or loose. Make sure that handrails are as long as the stairways. Check any carpeting to make sure that it is firmly attached to the stairs. Fix any carpet that is loose or worn. Avoid having throw rugs at the top or bottom of the stairs. If you do have throw rugs, attach them to the floor with carpet tape. Make sure that you have a light switch at the  top of the stairs and the bottom of the stairs. If you do not have them, ask someone to add them for you. What else can I do to help prevent falls? Wear shoes that: Do not have high heels. Have rubber bottoms. Are comfortable and fit you well. Are closed at the toe. Do not wear sandals. If you use a stepladder: Make sure that it is fully opened. Do not climb a closed stepladder. Make sure that both sides of the stepladder are locked into place. Ask someone to hold it for you, if possible. Clearly mark and make sure that you can see: Any grab bars or handrails. First and last steps. Where the edge of each step is. Use tools that help you move around (mobility aids) if they are needed. These include: Canes. Walkers. Scooters. Crutches. Turn on the lights when you go into a dark area. Replace any light bulbs as soon as they burn out. Set up your furniture so you have a clear path. Avoid moving your furniture around. If any of your floors are uneven, fix them. If there are any pets around you, be aware of where they are. Review your medicines with your doctor. Some medicines can make you feel dizzy. This can increase your chance of falling. Ask your doctor what other things that you can do to help prevent falls. This information is not intended to replace advice given to you by your health care provider. Make sure you discuss any questions you have with your health care provider. Document Released: 01/13/2009 Document Revised: 08/25/2015 Document Reviewed: 04/23/2014 Elsevier Interactive Patient Education  2017 [employer].

## 2021-11-27 NOTE — Progress Notes (Signed)
I connected with Carver Napoleone today by telephone and verified that I am speaking with the correct person using two identifiers. Location patient: home Location provider: work Persons participating in the virtual visit: patient, provider.   I discussed the limitations, risks, security and privacy concerns of performing an evaluation and management service by telephone and the availability of in person appointments. I also discussed with the patient that there may be a patient responsible charge related to this service. The patient expressed understanding and verbally consented to this telephonic visit.    Interactive audio and video telecommunications were attempted between this provider and patient, however failed, due to patient having technical difficulties OR patient did not have access to video capability.  We continued and completed visit with audio only.  Some vital signs may be absent or patient reported.   Time Spent with patient on telephone encounter: 30 minutes  Subjective:   Diana Watson is a 66 y.o. female who presents for Medicare Annual (Subsequent) preventive examination.  Review of Systems     Cardiac Risk Factors include: advanced age (>1men, >56 women);diabetes mellitus;dyslipidemia;family history of premature cardiovascular disease;hypertension     Objective:    There were no vitals filed for this visit. There is no height or weight on file to calculate BMI.     11/27/2021    8:52 AM 11/21/2020    8:23 AM 11/19/2019    9:12 AM 10/13/2018   11:52 AM 08/23/2017   10:51 AM 05/23/2016   11:24 AM 06/09/2014    8:03 PM  Advanced Directives  Does Patient Have a Medical Advance Directive? No No No No No No No  Would patient like information on creating a medical advance directive? No - Patient declined Yes (MAU/Ambulatory/Procedural Areas - Information given) Yes (MAU/Ambulatory/Procedural Areas - Information given) Yes (ED - Information included in AVS)  Yes (ED - Information included in AVS)  No - patient declined information    Current Medications (verified) Outpatient Encounter Medications as of 11/27/2021  Medication Sig   Alcohol Swabs PADS Use daily   amitriptyline (ELAVIL) 25 MG tablet    aspirin 81 MG tablet Take 81 mg by mouth daily.   buPROPion (WELLBUTRIN XL) 300 MG 24 hr tablet Take 300 mg by mouth daily.   busPIRone (BUSPAR) 10 MG tablet Take 10 mg by mouth in the morning, at noon, and at bedtime.   Calcium Citrate-Vitamin D (CALCIUM CITRATE + PO) Take by mouth.   Continuous Blood Gluc Receiver (FREESTYLE LIBRE 14 DAY READER) DEVI Use as directed   Continuous Blood Gluc Sensor (FREESTYLE LIBRE 14 DAY SENSOR) MISC Use to monitor sugars   Continuous Blood Gluc Sensor (FREESTYLE LIBRE SENSOR SYSTEM) MISC Use to monitor sugars   DOCUSATE SODIUM PO Take 400 mg by mouth.   glucose blood (ACCU-CHEK AVIVA PLUS) test strip CHECK BLOOD SUGAR TWICE DAILY   hydrocortisone 1 % ointment Apply 1 Application topically 2 (two) times daily.   lactulose (CHRONULAC) 10 GM/15ML solution Take 45 mLs (30 g total) by mouth daily as needed for mild constipation.   lamoTRIgine (LAMICTAL) 200 MG tablet Take 200 mg by mouth every morning. For mood control   lisinopril (ZESTRIL) 30 MG tablet TAKE 2 TABLETS EVERY DAY   loperamide (IMODIUM) 2 MG capsule Take 6 mg by mouth as needed for diarrhea or loose stools.   memantine (NAMENDA) 10 MG tablet Take 1 tablet (10 mg total) by mouth 2 (two) times daily.   metroNIDAZOLE (METROGEL) 0.75 % gel  Apply 1 Application topically 2 (two) times daily.   Multiple Vitamin (MULTIVITAMIN WITH MINERALS) TABS Take 1 tablet by mouth every morning.    naproxen (NAPROSYN) 500 MG tablet Take 500 mg by mouth 3 (three) times daily as needed. For pain   QUEtiapine (SEROQUEL) 50 MG tablet Take 50 mg by mouth at bedtime.   rosuvastatin (CRESTOR) 5 MG tablet TAKE 1 TABLET EVERY DAY   ziprasidone (GEODON) 80 MG capsule Take 160 mg by  mouth at bedtime.    No facility-administered encounter medications on file as of 11/27/2021.    Allergies (verified) Prednisone   History: Past Medical History:  Diagnosis Date   Bipolar 1 disorder (HCC)    Constipation    Depression    Diabetes mellitus    Herniated disc    Hyperlipidemia    Hypertension    Neurocognitive disorder    major neurocognitive disorder   Personality disorder [facility])    Past Surgical History:  Procedure Laterality Date   DILATION AND CURETTAGE OF UTERUS     RIGHT OOPHORECTOMY     Also removed a tumor at that time.   Family History  Problem Relation Age of Onset   Diabetes type II Mother    Hypertension Mother    OCD Mother    Bipolar disorder Mother    Hyperlipidemia Mother    Diabetes type II Father    Hypertension Father    Prostate cancer Father    Hyperlipidemia Father    Bipolar disorder Cousin    Pancreatic cancer Brother    Colon cancer Neg Hx    Social History   Socioeconomic History   Marital status: Married    Spouse name: Not on file   Number of children: 0   Years of education: some college   Highest education level: Not on file  Occupational History   Occupation: disability  Tobacco Use   Smoking status: Never   Smokeless tobacco: Never  Vaping Use   Vaping Use: Never used  Substance and Sexual Activity   Alcohol use: No    Alcohol/week: 6.0 - 12.0 standard drinks of alcohol    Types: 6 - 12 Standard drinks or equivalent per week   Drug use: Never   Sexual activity: Yes    Birth control/protection: None  Other Topics Concern   Not on file  Social History Narrative   Lives at home with husband.   Right-handed.   3-4 cups caffeine per day.   Social Determinants of Health   Financial Resource Strain: Low Risk  (11/27/2021)   Overall Financial Resource Strain (CARDIA)    Difficulty of Paying Living Expenses: Not hard at all  Food Insecurity: No Food Insecurity (11/27/2021)   Hunger Vital Sign    Worried  About Running Out of Food in the Last Year: Never true    Ran Out of Food in the Last Year: Never true  Transportation Needs: No Transportation Needs (11/27/2021)   PRAPARE - Administrator, Civil Service (Medical): No    Lack of Transportation (Non-Medical): No  Physical Activity: Sufficiently Active (11/27/2021)   Exercise Vital Sign    Days of Exercise per Week: 5 days    Minutes of Exercise per Session: 30 min  Stress: No Stress Concern Present (11/27/2021)   [employer] of Occupational Health - Occupational Stress Questionnaire    Feeling of Stress : Not at all  Social Connections: Moderately Isolated (11/27/2021)   Social Connection and Isolation Panel [  NHANES]    Frequency of Communication with Friends and Family: More than three times a week    Frequency of Social Gatherings with Friends and Family: Never    Attends Religious Services: Never    Marine scientist or Organizations: No    Attends Music therapist: Never    Marital Status: Married    Tobacco Counseling Counseling given: Not Answered   Clinical Intake:  Pre-visit preparation completed: Yes  Pain : No/denies pain     Nutritional Risks: None Diabetes: Yes CBG done?: No Did pt. bring in CBG monitor from home?: No  How often do you need to have someone help you when you read instructions, pamphlets, or other written materials from your doctor or pharmacy?: 1 - Never What is the last grade level you completed in school?: 2 years of college  Diabetic? yes  Interpreter Needed?: No  Information entered by :: Lisette Abu, LPN.   Activities of Daily Living    11/27/2021    9:07 AM  In your present state of health, do you have any difficulty performing the following activities:  Hearing? 0  Vision? 0  Difficulty concentrating or making decisions? 1  Walking or climbing stairs? 0  Dressing or bathing? 0  Doing errands, shopping? 1  Preparing Food and eating ? N   Using the Toilet? N  In the past six months, have you accidently leaked urine? Y  Do you have problems with loss of bowel control? N  Managing your Medications? N  Managing your Finances? Y  Housekeeping or managing your Housekeeping? N    Patient Care Team: Hoyt Koch, MD as PCP - General (Internal Medicine) Readling, Milana Huntsman, MD as Consulting Physician (Psychiatry) Cameron Sprang, MD as Consulting Physician (Neurology) Charlton Haws, [facility] as Pharmacist (Pharmacist)  Indicate any recent Medical Services you may have received from other than [facility] providers in the past year (date may be approximate).     Assessment:   This is a routine wellness examination for Jiyah.  Hearing/Vision screen Hearing Screening - Comments:: Patient denied any hearing difficulty.   No hearing aids.   Vision Screening - Comments:: Patient does wear corrective lenses/contacts.  Eye exam done by: Rutherford Guys, MD.   Dietary issues and exercise activities discussed: Current Exercise Habits: Home exercise routine;Structured exercise class, Type of exercise: walking;treadmill;stretching;strength training/weights;calisthenics, Time (Minutes): 30, Frequency (Times/Week): 5, Weekly Exercise (Minutes/Week): 150, Intensity: Moderate, Exercise limited by: psychological condition(s)   Goals Addressed             This Visit's Progress    Client will verbalize knowledge of diabetes self-management as evidenced by Hgb A1C <7 or as defined by provider.            Depression Screen    11/27/2021    8:58 AM 11/21/2020    8:30 AM 07/22/2020    8:09 AM 11/19/2019    9:43 AM 10/13/2018   10:29 AM 08/23/2017   10:51 AM  PHQ 2/9 Scores  PHQ - 2 Score 2 0 0 1 1 2   PHQ- 9 Score     1 4    Fall Risk    11/27/2021    8:53 AM 11/21/2020    8:26 AM 08/15/2020    9:23 AM 07/22/2020    8:09 AM 11/19/2019    9:13 AM  Fall Risk   Falls in the past year? 1 1 0 1 0  Number falls in past yr:  1 1 0 0 0   Injury with Fall? 0 0 0 0 0  Risk for fall due to : History of fall(s) History of fall(s) No Fall Risks  No Fall Risks  Follow up  Falls evaluation completed Falls evaluation completed  Falls evaluation completed;Education provided    FALL RISK PREVENTION PERTAINING TO THE HOME:  Any stairs in or around the home? No  If so, are there any without handrails? No  Home free of loose throw rugs in walkways, pet beds, electrical cords, etc? Yes  Adequate lighting in your home to reduce risk of falls? Yes   ASSISTIVE DEVICES UTILIZED TO PREVENT FALLS:  Life alert? No  Use of a cane, walker or w/c? No  Grab bars in the bathroom? No  Shower chair or bench in shower? No  Elevated toilet seat or a handicapped toilet? No   TIMED UP AND GO:  Was the test performed? No .  Length of time to ambulate 10 feet: n/a sec.   Appearance of gait: Gait not evaluated during this visit.  Cognitive Function: Patient has current diagnosis of cognitive impairment. Patient is followed by neurology for ongoing assessment.  Patient is unable to complete screening 6CIT or MMSE.      11/27/2021    9:13 AM 12/24/2019    8:23 AM 11/19/2019    9:47 AM  MMSE - Mini Mental State Exam  Not completed: Unable to complete  Unable to complete  Orientation to time  5   Orientation to Place  5   Registration  3   Attention/ Calculation  5   Recall  3   Language- name 2 objects  2   Language- repeat  1   Language- follow 3 step command  3   Language- read & follow direction  1   Write a sentence  1   Copy design  1   Total score  30       09/27/2016   11:00 AM  Montreal Cognitive Assessment   Visuospatial/ Executive (0/5) 4  Naming (0/3) 3  Attention: Read list of digits (0/2) 2  Attention: Read list of letters (0/1) 1  Attention: Serial 7 subtraction starting at 100 (0/3) 3  Language: Repeat phrase (0/2) 2  Language : Fluency (0/1) 1  Abstraction (0/2) 2  Delayed Recall (0/5) 5  Orientation (0/6) 6   Total 29      Immunizations Immunization History  Administered Date(s) Administered   Influenza Split 12/07/2011   Influenza,inj,Quad PF,6+ Mos 12/11/2017, 12/09/2018   Influenza-Unspecified 12/21/2015, 01/14/2017, 01/14/2018, 12/26/2018, 02/01/2020, 01/17/2021   Moderna Sars-Covid-2 Vaccination 09/01/2019, 09/29/2019, 04/11/2020   Pfizer Covid-19 Vaccine Bivalent Booster 59yrs & up 01/18/2021   Pneumococcal Polysaccharide-23 01/11/2008, 01/05/2016   Tdap 01/06/2012   Zoster, Live 10/12/2015    TDAP status: Up to date  Flu Vaccine status: Up to date  Pneumococcal vaccine status: Up to date  Covid-19 vaccine status: Completed vaccines  Qualifies for Shingles Vaccine? Yes   Zostavax completed Yes   Shingrix Completed?: No.    Education has been provided regarding the importance of this vaccine. Patient has been advised to call insurance company to determine out of pocket expense if they have not yet received this vaccine. Advised may also receive vaccine at local pharmacy or Health Dept. Verbalized acceptance and understanding.  Screening Tests Health Maintenance  Topic Date Due   Zoster Vaccines- Shingrix (1 of 2) Never done   PAP SMEAR-Modifier  04/16/2017  OPHTHALMOLOGY EXAM  01/12/2020   INFLUENZA VACCINE  10/31/2021   TETANUS/TDAP  01/05/2022   HEMOGLOBIN A1C  05/20/2022   Diabetic kidney evaluation - GFR measurement  06/27/2022   Diabetic kidney evaluation - Urine ACR  06/27/2022   FOOT EXAM  06/27/2022   MAMMOGRAM  11/15/2023   COLONOSCOPY (Pts 45-60yrs Insurance coverage will need to be confirmed)  06/07/2026   COVID-19 Vaccine  Completed   Hepatitis C Screening  Completed   HIV Screening  Completed   HPV VACCINES  Aged Out    Health Maintenance  Health Maintenance Due  Topic Date Due   Zoster Vaccines- Shingrix (1 of 2) Never done   PAP SMEAR-Modifier  04/16/2017   OPHTHALMOLOGY EXAM  01/12/2020   INFLUENZA VACCINE  10/31/2021    Colorectal cancer  screening: Type of screening: Colonoscopy. Completed 06/06/2016. Repeat every 10 years  Mammogram status: Completed 11/14/2021. Repeat every year  Bone Density status: never done  Lung Cancer Screening: (Low Dose CT Chest recommended if Age 3-80 years, 30 pack-year currently smoking OR have quit w/in 15years.) does not qualify.   Lung Cancer Screening Referral: no  Additional Screening:  Hepatitis C Screening: does qualify; Completed 04/27/2010  Vision Screening: Recommended annual ophthalmology exams for early detection of glaucoma and other disorders of the eye. Is the patient up to date with their annual eye exam?  Yes  Who is the provider or what is the name of the office in which the patient attends annual eye exams? Jethro Bolus, MD. If pt is not established with a provider, would they like to be referred to a provider to establish care? No .   Dental Screening: Recommended annual dental exams for proper oral hygiene  Community Resource Referral / Chronic Care Management: CRR required this visit?  No   CCM required this visit?  No      Plan:     I have personally reviewed and noted the following in the patient's chart:   Medical and social history Use of alcohol, tobacco or illicit drugs  Current medications and supplements including opioid prescriptions. Patient is not currently taking opioid prescriptions. Functional ability and status Nutritional status Physical activity Advanced directives List of other physicians Hospitalizations, surgeries, and ER visits in previous 12 months Vitals Screenings to include cognitive, depression, and falls Referrals and appointments  In addition, I have reviewed and discussed with patient certain preventive protocols, quality metrics, and best practice recommendations. A written personalized care plan for preventive services as well as general preventive health recommendations were provided to patient.     Mickeal Needy,  [place]   0/63/0160   Nurse Notes:  There were no vitals filed for this visit. There is no height or weight on file to calculate BMI. Patient stated that she has no issues with gait or balance; does not use any assistive devices.

## 2021-11-28 ENCOUNTER — Ambulatory Visit: Payer: Medicare HMO | Admitting: Internal Medicine

## 2022-01-01 ENCOUNTER — Ambulatory Visit: Payer: Medicare HMO | Admitting: Internal Medicine

## 2022-01-11 ENCOUNTER — Other Ambulatory Visit: Payer: Self-pay | Admitting: *Deleted

## 2022-01-11 ENCOUNTER — Telehealth: Payer: Self-pay | Admitting: Internal Medicine

## 2022-01-11 DIAGNOSIS — E1169 Type 2 diabetes mellitus with other specified complication: Secondary | ICD-10-CM

## 2022-01-11 MED ORDER — ACCU-CHEK AVIVA PLUS VI STRP: ORAL_STRIP | 3 refills | Status: DC

## 2022-01-11 NOTE — Telephone Encounter (Signed)
Patient needs Dr. Sharlet Salina to change instructions on diabetic testing to testing 6 times a day - Patient is testing before and after each meal. Patient also needs a refill on her accucheck lancets.  Please send to [facility].

## 2022-01-11 NOTE — Telephone Encounter (Signed)
Please advise 

## 2022-01-12 NOTE — Telephone Encounter (Signed)
Those sound normal for her

## 2022-01-12 NOTE — Telephone Encounter (Signed)
With her insurance and medications they will only cover daily testing. Is she wanting to pay out of pocket for supplies to test more often? I do not think she needs to test that often. Daily should be fine.

## 2022-01-12 NOTE — Telephone Encounter (Signed)
Offer f/u appt but pt declined, but will call the office in mid-Nov.

## 2022-01-12 NOTE — Telephone Encounter (Signed)
Notified pt regarding --insurance only cover daily testing and pt voiced understanding. Pt stated--checking blood sugar readings- highest 173 and lowest 122.

## 2022-01-23 DIAGNOSIS — E1136 Type 2 diabetes mellitus with diabetic cataract: Secondary | ICD-10-CM | POA: Diagnosis not present

## 2022-01-23 DIAGNOSIS — H524 Presbyopia: Secondary | ICD-10-CM | POA: Diagnosis not present

## 2022-01-23 DIAGNOSIS — H5203 Hypermetropia, bilateral: Secondary | ICD-10-CM | POA: Diagnosis not present

## 2022-01-23 DIAGNOSIS — H52203 Unspecified astigmatism, bilateral: Secondary | ICD-10-CM | POA: Diagnosis not present

## 2022-01-23 DIAGNOSIS — H25813 Combined forms of age-related cataract, bilateral: Secondary | ICD-10-CM | POA: Diagnosis not present

## 2022-01-23 LAB — HM DIABETES EYE EXAM

## 2022-02-08 DIAGNOSIS — F419 Anxiety disorder, unspecified: Secondary | ICD-10-CM | POA: Diagnosis not present

## 2022-02-13 ENCOUNTER — Other Ambulatory Visit: Payer: Self-pay

## 2022-02-13 DIAGNOSIS — E1169 Type 2 diabetes mellitus with other specified complication: Secondary | ICD-10-CM

## 2022-02-13 MED ORDER — ACCU-CHEK AVIVA PLUS VI STRP: ORAL_STRIP | 3 refills | Status: DC

## 2022-02-14 ENCOUNTER — Other Ambulatory Visit: Payer: Self-pay | Admitting: Internal Medicine

## 2022-02-14 ENCOUNTER — Other Ambulatory Visit: Payer: Self-pay

## 2022-02-14 DIAGNOSIS — E1169 Type 2 diabetes mellitus with other specified complication: Secondary | ICD-10-CM

## 2022-02-14 MED ORDER — ACCU-CHEK AVIVA PLUS VI STRP: ORAL_STRIP | 3 refills | Status: DC

## 2022-02-20 ENCOUNTER — Ambulatory Visit (INDEPENDENT_AMBULATORY_CARE_PROVIDER_SITE_OTHER): Payer: Medicare HMO | Admitting: Internal Medicine

## 2022-02-20 ENCOUNTER — Encounter: Payer: Self-pay | Admitting: Internal Medicine

## 2022-02-20 VITALS — BP 124/80 | HR 76 | Temp 97.9°F | Ht 65.0 in | Wt 160.0 lb

## 2022-02-20 DIAGNOSIS — E1169 Type 2 diabetes mellitus with other specified complication: Secondary | ICD-10-CM

## 2022-02-20 LAB — POCT GLYCOSYLATED HEMOGLOBIN (HGB A1C): HbA1c POC (<> result, manual entry): 6.1 % (ref 4.0–5.6)

## 2022-02-20 NOTE — Progress Notes (Signed)
   Subjective:   Patient ID: Diana Watson, female    DOB: 1956-03-15, 66 y.o.   MRN: [id number]  HPI The patient is a 66 YO female coming in for follow up. HGA1c 6.1  Review of Systems  Constitutional: Negative.   HENT: Negative.    Eyes: Negative.   Respiratory:  Negative for cough, chest tightness and shortness of breath.   Cardiovascular:  Negative for chest pain, palpitations and leg swelling.  Gastrointestinal:  Negative for abdominal distention, abdominal pain, constipation, diarrhea, nausea and vomiting.  Musculoskeletal: Negative.   Skin: Negative.   Neurological: Negative.   Psychiatric/Behavioral: Negative.      Objective:  Physical Exam Constitutional:      Appearance: She is well-developed.  HENT:     Head: Normocephalic and atraumatic.  Cardiovascular:     Rate and Rhythm: Normal rate and regular rhythm.  Pulmonary:     Effort: Pulmonary effort is normal. No respiratory distress.     Breath sounds: Normal breath sounds. No wheezing or rales.  Abdominal:     General: Bowel sounds are normal. There is no distension.     Palpations: Abdomen is soft.     Tenderness: There is no abdominal tenderness. There is no rebound.  Musculoskeletal:     Cervical back: Normal range of motion.  Skin:    General: Skin is warm and dry.  Neurological:     Mental Status: She is alert and oriented to person, place, and time.     Coordination: Coordination normal.     Vitals:   02/20/22 0808  BP: 124/80  Pulse: 76  Temp: 97.9 F (36.6 C)  TempSrc: Oral  SpO2: 99%  Weight: 160 lb (72.6 kg)  Height: 5\' 5"  (1.651 m)    Assessment & Plan:

## 2022-02-20 NOTE — Addendum Note (Signed)
Addended by: Levonne Lapping on: 02/20/2022 09:41 AM   Modules accepted: Orders

## 2022-02-20 NOTE — Addendum Note (Signed)
Addended by: Levonne Lapping on: 02/20/2022 09:39 AM   Modules accepted: Orders

## 2022-02-20 NOTE — Assessment & Plan Note (Signed)
POC HgA1c done today and 6.1. We will continue with close monitoring and diet/exercise changes. She is on crestor 5 mg daily and ACE-I will continue. She does need ongoing close monitoring given her medications which raise her risk of hyperglycemia and will plan ongoing HgA1c every 3 months.

## 2022-02-20 NOTE — Patient Instructions (Signed)
Your HgA1c is 6.1 so keep up the good work.

## 2022-03-13 DIAGNOSIS — F3131 Bipolar disorder, current episode depressed, mild: Secondary | ICD-10-CM | POA: Diagnosis not present

## 2022-03-13 DIAGNOSIS — F419 Anxiety disorder, unspecified: Secondary | ICD-10-CM | POA: Diagnosis not present

## 2022-05-03 DIAGNOSIS — F3131 Bipolar disorder, current episode depressed, mild: Secondary | ICD-10-CM | POA: Diagnosis not present

## 2022-07-11 ENCOUNTER — Other Ambulatory Visit: Payer: Self-pay | Admitting: Internal Medicine

## 2022-07-12 DIAGNOSIS — F3131 Bipolar disorder, current episode depressed, mild: Secondary | ICD-10-CM | POA: Diagnosis not present

## 2022-07-12 DIAGNOSIS — F419 Anxiety disorder, unspecified: Secondary | ICD-10-CM | POA: Diagnosis not present

## 2022-07-17 ENCOUNTER — Telehealth: Payer: Self-pay | Admitting: Internal Medicine

## 2022-07-17 MED ORDER — LISINOPRIL 30 MG PO TABS: 60.0000 mg | ORAL_TABLET | Freq: Every day | ORAL | 0 refills | Status: DC

## 2022-07-17 MED ORDER — ROSUVASTATIN CALCIUM 5 MG PO TABS: 5.0000 mg | ORAL_TABLET | Freq: Every day | ORAL | 0 refills | Status: DC

## 2022-07-17 NOTE — Telephone Encounter (Signed)
Sent 90 day to mail order pt is due for annual appt.Marland KitchenRaechel Chute

## 2022-07-17 NOTE — Telephone Encounter (Signed)
Prescription Request  07/17/2022  LOV: 02/20/2022  What is the name of the medication or equipment? lisinopril (ZESTRIL) 30 MG tablet  rosuvastatin (CRESTOR) 5 MG tablet  Have you contacted your pharmacy to request a refill? Yes   Which pharmacy would you like this sent to?  [facility] - [place], [place] - [address] [place] [address] Phone: [phone] Fax: [phone]     Patient notified that their request is being sent to the clinical staff for review and that they should receive a response within 2 business days.   Please advise at Mobile [phone] (mobile)

## 2022-08-14 ENCOUNTER — Encounter: Payer: Self-pay | Admitting: Internal Medicine

## 2022-08-14 ENCOUNTER — Ambulatory Visit (INDEPENDENT_AMBULATORY_CARE_PROVIDER_SITE_OTHER): Payer: Medicare HMO | Admitting: Internal Medicine

## 2022-08-14 VITALS — BP 136/69 | HR 79 | Temp 97.8°F | Ht 65.0 in | Wt 164.0 lb

## 2022-08-14 DIAGNOSIS — Z0000 Encounter for general adult medical examination without abnormal findings: Secondary | ICD-10-CM | POA: Diagnosis not present

## 2022-08-14 DIAGNOSIS — Z23 Encounter for immunization: Secondary | ICD-10-CM | POA: Diagnosis not present

## 2022-08-14 DIAGNOSIS — F319 Bipolar disorder, unspecified: Secondary | ICD-10-CM | POA: Diagnosis not present

## 2022-08-14 DIAGNOSIS — F0390 Unspecified dementia without behavioral disturbance: Secondary | ICD-10-CM | POA: Diagnosis not present

## 2022-08-14 DIAGNOSIS — K5900 Constipation, unspecified: Secondary | ICD-10-CM

## 2022-08-14 DIAGNOSIS — K760 Fatty (change of) liver, not elsewhere classified: Secondary | ICD-10-CM | POA: Diagnosis not present

## 2022-08-14 DIAGNOSIS — L719 Rosacea, unspecified: Secondary | ICD-10-CM

## 2022-08-14 DIAGNOSIS — E1169 Type 2 diabetes mellitus with other specified complication: Secondary | ICD-10-CM

## 2022-08-14 DIAGNOSIS — I10 Essential (primary) hypertension: Secondary | ICD-10-CM

## 2022-08-14 LAB — COMPREHENSIVE METABOLIC PANEL
ALT: 78 U/L — ABNORMAL HIGH (ref 0–35)
AST: 43 U/L — ABNORMAL HIGH (ref 0–37)
Albumin: 4.4 g/dL (ref 3.5–5.2)
Alkaline Phosphatase: 91 U/L (ref 39–117)
BUN: 24 mg/dL — ABNORMAL HIGH (ref 6–23)
CO2: 29 mEq/L (ref 19–32)
Calcium: 9.9 mg/dL (ref 8.4–10.5)
Chloride: 101 mEq/L (ref 96–112)
Creatinine, Ser: 0.91 mg/dL (ref 0.40–1.20)
GFR: 66.35 mL/min (ref >60.00)
Glucose, Bld: 102 mg/dL — ABNORMAL HIGH (ref 70–99)
Potassium: 4.8 mEq/L (ref 3.5–5.1)
Sodium: 139 mEq/L (ref 135–145)
Total Bilirubin: 0.2 mg/dL (ref 0.2–1.2)
Total Protein: 7.9 g/dL (ref 6.0–8.3)

## 2022-08-14 LAB — LIPID PANEL
Cholesterol: 158 mg/dL (ref 0–200)
HDL: 57 mg/dL (ref >39.00)
LDL Cholesterol: 78 mg/dL (ref 0–99)
NonHDL: 101.13
Total CHOL/HDL Ratio: 3
Triglycerides: 114 mg/dL (ref 0.0–149.0)
VLDL: 22.8 mg/dL (ref 0.0–40.0)

## 2022-08-14 LAB — CBC
HCT: 37 % (ref 36.0–46.0)
Hemoglobin: 12.5 g/dL (ref 12.0–15.0)
MCHC: 33.7 g/dL (ref 30.0–36.0)
MCV: 98.2 fl (ref 78.0–100.0)
Platelets: 182 10*3/uL (ref 150.0–400.0)
RBC: 3.76 Mil/uL — ABNORMAL LOW (ref 3.87–5.11)
RDW: 13.3 % (ref 11.5–15.5)
WBC: 4.4 10*3/uL (ref 4.0–10.5)

## 2022-08-14 LAB — MICROALBUMIN / CREATININE URINE RATIO
Creatinine,U: 34.9 mg/dL
Microalb Creat Ratio: 2 mg/g (ref 0.0–30.0)
Microalb, Ur: <0.7 mg/dL (ref 0.0–1.9)

## 2022-08-14 LAB — VITAMIN B12: Vitamin B-12: 496 pg/mL (ref 211–911)

## 2022-08-14 LAB — TSH: TSH: 2.19 u[IU]/mL (ref 0.35–5.50)

## 2022-08-14 LAB — HEMOGLOBIN A1C: Hgb A1c MFr Bld: 7 % — ABNORMAL HIGH (ref 4.6–6.5)

## 2022-08-14 LAB — VITAMIN D 25 HYDROXY (VIT D DEFICIENCY, FRACTURES): VITD: 36.03 ng/mL (ref 30.00–100.00)

## 2022-08-14 NOTE — Addendum Note (Signed)
Addended by: Levonne Lapping on: 08/14/2022 09:53 AM   Modules accepted: Orders

## 2022-08-14 NOTE — Assessment & Plan Note (Signed)
Seeing psych for management and overall stable. She is overeating due to emotional reasons and seeing counselor to try to help but not successful as of yet.

## 2022-08-14 NOTE — Assessment & Plan Note (Signed)
Slight flare today an use metronidazole gel.

## 2022-08-14 NOTE — Assessment & Plan Note (Signed)
Checking HgA1c, microalbumin to creatinine ratio lipid panel and CMP and adjust as needed. She is off medication at this time. She is worried sugars are higher due to dietary lapses lately. She is on ACE-I and statin.

## 2022-08-14 NOTE — Progress Notes (Signed)
   Subjective:   Patient ID: Diana Watson, female    DOB: Apr 08, 1955, 67 y.o.   MRN: [id number]  HPI The patient is here for physical.  PMH, [facility], social history reviewed and updated  Review of Systems  Constitutional: Negative.   HENT: Negative.    Eyes: Negative.   Respiratory:  Negative for cough, chest tightness and shortness of breath.   Cardiovascular:  Negative for chest pain, palpitations and leg swelling.  Gastrointestinal:  Negative for abdominal distention, abdominal pain, constipation, diarrhea, nausea and vomiting.  Musculoskeletal: Negative.   Skin: Negative.   Neurological:  Positive for numbness.  Psychiatric/Behavioral: Negative.      Objective:  Physical Exam Constitutional:      Appearance: She is well-developed.  HENT:     Head: Normocephalic and atraumatic.  Cardiovascular:     Rate and Rhythm: Normal rate and regular rhythm.  Pulmonary:     Effort: Pulmonary effort is normal. No respiratory distress.     Breath sounds: Normal breath sounds. No wheezing or rales.  Abdominal:     General: Bowel sounds are normal. There is no distension.     Palpations: Abdomen is soft.     Tenderness: There is no abdominal tenderness. There is no rebound.  Musculoskeletal:     Cervical back: Normal range of motion.  Skin:    General: Skin is warm and dry.     Comments: Foot exam done  Neurological:     Mental Status: She is alert and oriented to person, place, and time.     Coordination: Coordination normal.     Vitals:   08/14/22 0807  BP: 136/69  Pulse: 79  Temp: 97.8 F (36.6 C)  TempSrc: Oral  SpO2: 100%  Weight: 164 lb (74.4 kg)  Height: 5\' 5"  (1.651 m)    Assessment & Plan:  Prevnar 20 given at visit

## 2022-08-14 NOTE — Assessment & Plan Note (Signed)
BP at goal on lisinopril 60 mg daily. Checking CMP and adjust as needed.

## 2022-08-14 NOTE — Assessment & Plan Note (Signed)
Solved with diet at the moment has used lactulose in the past to help.

## 2022-08-14 NOTE — Assessment & Plan Note (Signed)
Checking CMP and adjust as needed.  

## 2022-08-14 NOTE — Assessment & Plan Note (Signed)
Overall stable and she is working with counselor to help with routines. She is encouraged to stay physically and emotionally active to help.

## 2022-08-14 NOTE — Assessment & Plan Note (Signed)
Flu shot yearly. Pneumonia given 20 today. Shingrix counseled due at pharmacy. Tetanus due at pharmacy. Colonoscopy up to date. Mammogram up to date, pap smear up to date and dexa up to date. Counseled about sun safety and mole surveillance. Counseled about the dangers of distracted driving. Given 10 year screening recommendations.

## 2022-08-28 ENCOUNTER — Encounter: Payer: Self-pay | Admitting: Podiatry

## 2022-09-07 ENCOUNTER — Ambulatory Visit: Payer: Medicare HMO | Admitting: Podiatry

## 2022-09-21 ENCOUNTER — Encounter: Payer: Self-pay | Admitting: Podiatry

## 2022-09-21 ENCOUNTER — Ambulatory Visit: Payer: Medicare HMO | Admitting: Podiatry

## 2022-09-21 VITALS — BP 144/69

## 2022-09-21 DIAGNOSIS — E1169 Type 2 diabetes mellitus with other specified complication: Secondary | ICD-10-CM | POA: Diagnosis not present

## 2022-09-21 DIAGNOSIS — M79674 Pain in right toe(s): Secondary | ICD-10-CM | POA: Diagnosis not present

## 2022-09-21 DIAGNOSIS — B351 Tinea unguium: Secondary | ICD-10-CM

## 2022-09-21 DIAGNOSIS — M79675 Pain in left toe(s): Secondary | ICD-10-CM

## 2022-09-21 NOTE — Progress Notes (Signed)
This patient presents to the office with chief complaint of long thick nails and diabetic feet.  This patient  says there  is  no pain and discomfort in their feet.  This patient says there are long thick painful nails.  These nails are painful walking and wearing shoes.  Patient has no history of infection or drainage from both feet.  Patient is unable to  self treat his own nails . This patient presents  to the office today for treatment of the  long nails and a foot evaluation due to history of  diabetes.  General Appearance  Alert, conversant and in no acute stress.  Vascular  Dorsalis pedis and posterior tibial  pulses are palpable  bilaterally.  Capillary return is within normal limits  bilaterally. Temperature is within normal limits  bilaterally.  Neurologic  Senn-Weinstein monofilament wire test within normal limits  bilaterally. Muscle power within normal limits bilaterally.  Nails Thick disfigured discolored nails with subungual debris  from hallux to fifth toes bilaterally. No evidence of bacterial infection or drainage bilaterally.  Orthopedic  No limitations of motion of motion feet .  No crepitus or effusions noted.  No bony pathology or digital deformities noted.  Skin  normotropic skin with no porokeratosis noted bilaterally.  No signs of infections or ulcers noted.     Onychomycosis  Diabetes with no foot complications  IE  Debride nails x 10.  A diabetic foot exam was performed and there is no evidence of any vascular or neurologic pathology.   RTC 3 months.   Pranavi Aure DPM  

## 2022-09-28 ENCOUNTER — Other Ambulatory Visit: Payer: Self-pay | Admitting: Internal Medicine

## 2022-10-09 DIAGNOSIS — F3131 Bipolar disorder, current episode depressed, mild: Secondary | ICD-10-CM | POA: Diagnosis not present

## 2022-10-09 DIAGNOSIS — F419 Anxiety disorder, unspecified: Secondary | ICD-10-CM | POA: Diagnosis not present

## 2022-10-25 ENCOUNTER — Telehealth: Payer: Self-pay | Admitting: Internal Medicine

## 2022-10-25 NOTE — Telephone Encounter (Signed)
Called and spoke with pt, it was in regards to her husband so the message is in an encounter in his chart. Routed to PCP

## 2022-10-25 NOTE — Telephone Encounter (Signed)
Pt wanting to speak with nurse she would not tell me what for stated it was a personal matter and it was urgent. Please advise.

## 2022-11-28 DIAGNOSIS — F3131 Bipolar disorder, current episode depressed, mild: Secondary | ICD-10-CM | POA: Diagnosis not present

## 2022-11-28 DIAGNOSIS — F419 Anxiety disorder, unspecified: Secondary | ICD-10-CM | POA: Diagnosis not present

## 2022-12-11 ENCOUNTER — Telehealth: Payer: Self-pay | Admitting: *Deleted

## 2022-12-11 ENCOUNTER — Ambulatory Visit (INDEPENDENT_AMBULATORY_CARE_PROVIDER_SITE_OTHER): Payer: Medicare HMO

## 2022-12-11 VITALS — Ht 65.0 in | Wt 176.0 lb

## 2022-12-11 DIAGNOSIS — Z0000 Encounter for general adult medical examination without abnormal findings: Secondary | ICD-10-CM

## 2022-12-11 DIAGNOSIS — Z1231 Encounter for screening mammogram for malignant neoplasm of breast: Secondary | ICD-10-CM | POA: Diagnosis not present

## 2022-12-11 DIAGNOSIS — Z780 Asymptomatic menopausal state: Secondary | ICD-10-CM

## 2022-12-11 DIAGNOSIS — Z5941 Food insecurity: Secondary | ICD-10-CM | POA: Diagnosis not present

## 2022-12-11 NOTE — Progress Notes (Signed)
  Care Coordination   Note   12/11/2022 Name: Diana Watson MRN: [id number] DOB: October 30, 1955  Diana Watson is a 67 y.o. year old female who sees Myrlene Broker, MD for primary care. I reached out to Moise Boring by phone today to offer care coordination services.  Diana Watson was given information about Care Coordination services today including:   The Care Coordination services include support from the care team which includes your Nurse Coordinator, Clinical Social Worker, or Pharmacist.  The Care Coordination team is here to help remove barriers to the health concerns and goals most important to you. Care Coordination services are voluntary, and the patient may decline or stop services at any time by request to their care team member.   Care Coordination Consent Status: Patient agreed to services and verbal consent obtained.   Follow up plan:  Telephone appointment with care coordination team member scheduled for:  12/21/2022  Encounter Outcome:  Patient Scheduled from referral   Burman Nieves, [facility] Care Coordination Care Guide Direct Dial: [phone]

## 2022-12-11 NOTE — Progress Notes (Signed)
Subjective:   Diana Watson is a 67 y.o. female who presents for Medicare Annual (Subsequent) preventive examination.  Visit Complete: Virtual  I connected with  Icyss Whisonant on 12/11/22 by a audio enabled telemedicine application and verified that I am speaking with the correct person using two identifiers.  Patient Location: Home  Provider Location: Home Office  I discussed the limitations of evaluation and management by telemedicine. The patient expressed understanding and agreed to proceed.  Vital Signs: Unable to obtain new vitals due to this being a telehealth visit.   Review of Systems    Cardiac Risk Factors include: diabetes mellitus;advanced age (>34men, >79 women);hypertension       Objective:    Today's Vitals   12/11/22 0819  Weight: 176 lb (79.8 kg)  Height: 5\' 5"  (1.651 m)   Body mass index is 29.29 kg/m.     12/11/2022    8:29 AM 11/27/2021    8:52 AM 11/21/2020    8:23 AM 11/19/2019    9:12 AM 10/13/2018   11:52 AM 08/23/2017   10:51 AM 05/23/2016   11:24 AM  Advanced Directives  Does Patient Have a Medical Advance Directive? No No No No No No No  Would patient like information on creating a medical advance directive? No - Patient declined No - Patient declined Yes (MAU/Ambulatory/Procedural Areas - Information given) Yes (MAU/Ambulatory/Procedural Areas - Information given) Yes (ED - Information included in AVS) Yes (ED - Information included in AVS)     Current Medications (verified) Outpatient Encounter Medications as of 12/11/2022  Medication Sig   aspirin 81 MG tablet Take 81 mg by mouth daily.   busPIRone (BUSPAR) 10 MG tablet Take 10 mg by mouth in the morning, at noon, and at bedtime.   Calcium Citrate-Vitamin D (CALCIUM CITRATE + PO) Take by mouth.   glucose blood (ACCU-CHEK AVIVA PLUS) test strip CHECK BLOOD SUGAR TWICE DAILY   lamoTRIgine (LAMICTAL) 200 MG tablet Take 200 mg by mouth every morning. For mood control   lisinopril  (ZESTRIL) 30 MG tablet Take 2 tablets (60 mg total) by mouth daily. Overdue for Annual appt due must see provider for future refills   metroNIDAZOLE (METROGEL) 0.75 % gel Apply 1 Application topically 2 (two) times daily.   Multiple Vitamin (MULTIVITAMIN WITH MINERALS) TABS Take 1 tablet by mouth every morning.    naproxen (NAPROSYN) 500 MG tablet Take 500 mg by mouth 3 (three) times daily as needed. For pain   rosuvastatin (CRESTOR) 5 MG tablet Take 1 tablet (5 mg total) by mouth daily. Overdue for Annual appt must see provider for future refills   ziprasidone (GEODON) 80 MG capsule Take 160 mg by mouth at bedtime.    Alcohol Swabs PADS Use daily   buPROPion (WELLBUTRIN XL) 300 MG 24 hr tablet Take 300 mg by mouth daily. (Patient not taking: Reported on 12/11/2022)   DOCUSATE SODIUM PO Take 400 mg by mouth. (Patient not taking: Reported on 12/11/2022)   hydrocortisone 1 % ointment Apply 1 Application topically 2 (two) times daily. (Patient not taking: Reported on 12/11/2022)   lactulose (CHRONULAC) 10 GM/15ML solution Take 45 mLs (30 g total) by mouth daily as needed for mild constipation. (Patient not taking: Reported on 12/11/2022)   loperamide (IMODIUM) 2 MG capsule Take 6 mg by mouth as needed for diarrhea or loose stools. (Patient not taking: Reported on 12/11/2022)   QUEtiapine (SEROQUEL) 50 MG tablet Take 50 mg by mouth at bedtime. (Patient not taking: Reported  on 12/11/2022)   No facility-administered encounter medications on file as of 12/11/2022.    Allergies (verified) Prednisone   History: Past Medical History:  Diagnosis Date   Bipolar 1 disorder (HCC)    Constipation    Depression    Diabetes mellitus    Herniated disc    Hyperlipidemia    Hypertension    Neurocognitive disorder    major neurocognitive disorder   Personality disorder [facility])    Past Surgical History:  Procedure Laterality Date   DILATION AND CURETTAGE OF UTERUS     RIGHT OOPHORECTOMY     Also removed a  tumor at that time.   Family History  Problem Relation Age of Onset   Diabetes type II Mother    Hypertension Mother    OCD Mother    Bipolar disorder Mother    Hyperlipidemia Mother    Diabetes type II Father    Hypertension Father    Prostate cancer Father    Hyperlipidemia Father    Bipolar disorder Cousin    Pancreatic cancer Brother    Colon cancer Neg Hx    Social History   Socioeconomic History   Marital status: Married    Spouse name: Edwardo   Number of children: 0   Years of education: some college   Highest education level: Not on file  Occupational History   Occupation: disability  Tobacco Use   Smoking status: Never   Smokeless tobacco: Never  Vaping Use   Vaping status: Never Used  Substance and Sexual Activity   Alcohol use: No    Alcohol/week: 6.0 - 12.0 standard drinks of alcohol    Types: 6 - 12 Standard drinks or equivalent per week   Drug use: Never   Sexual activity: Yes    Birth control/protection: None  Other Topics Concern   Not on file  Social History Narrative   Lives at home with husband.   Right-handed.   3-4 cups caffeine per day.   Social Determinants of Health   Financial Resource Strain: High Risk (12/11/2022)   Overall Financial Resource Strain (CARDIA)    Difficulty of Paying Living Expenses: Very hard  Food Insecurity: Food Insecurity Present (12/11/2022)   Hunger Vital Sign    Worried About Running Out of Food in the Last Year: Often true    Ran Out of Food in the Last Year: Often true  Transportation Needs: No Transportation Needs (12/11/2022)   PRAPARE - Administrator, Civil Service (Medical): No    Lack of Transportation (Non-Medical): No  Physical Activity: Insufficiently Active (12/11/2022)   Exercise Vital Sign    Days of Exercise per Week: 3 days    Minutes of Exercise per Session: 10 min  Stress: No Stress Concern Present (12/11/2022)   [employer] of Occupational Health - Occupational Stress  Questionnaire    Feeling of Stress : Not at all  Social Connections: Socially Isolated (12/11/2022)   Social Connection and Isolation Panel [NHANES]    Frequency of Communication with Friends and Family: Never    Frequency of Social Gatherings with Friends and Family: Never    Attends Religious Services: Never    Database administrator or Organizations: No    Attends Engineer, structural: Never    Marital Status: Married    Tobacco Counseling Counseling given: Not Answered   Clinical Intake:  Pre-visit preparation completed: Yes  Pain : No/denies pain     BMI - recorded: 29.29 Nutritional  Status: BMI 25 -29 Overweight Nutritional Risks: None Diabetes: Yes CBG done?: Yes (159 fasting) CBG resulted in Enter/ Edit results?: No Did pt. bring in CBG monitor from home?: No  How often do you need to have someone help you when you read instructions, pamphlets, or other written materials from your doctor or pharmacy?: 1 - Never  Interpreter Needed?: No  Information entered by :: Francy Mcilvaine, RMA   Activities of Daily Living    12/11/2022    8:22 AM  In your present state of health, do you have any difficulty performing the following activities:  Hearing? 0  Vision? 0  Difficulty concentrating or making decisions? 1  Walking or climbing stairs? 1  Comment per pt, loses her balance.  Dressing or bathing? 0  Doing errands, shopping? 1  Comment her husband drives her around.  Preparing Food and eating ? N  Using the Toilet? N  In the past six months, have you accidently leaked urine? N  Do you have problems with loss of bowel control? N  Managing your Medications? N  Managing your Finances? N  Housekeeping or managing your Housekeeping? N    Patient Care Team: Myrlene Broker, MD as PCP - General (Internal Medicine) Readling, Curlene Labrum, MD as Consulting Physician (Psychiatry) Van Clines, MD as Consulting Physician (Neurology) Kathyrn Sheriff,  [facility] (Inactive) as Pharmacist (Pharmacist)  Indicate any recent Medical Services you may have received from other than [facility] providers in the past year (date may be approximate).     Assessment:   This is a routine wellness examination for Clarke.  Hearing/Vision screen Hearing Screening - Comments:: Denies hearing difficulties   Vision Screening - Comments:: Wears eyeglasses   Goals Addressed             This Visit's Progress    Patient Stated   Not on track    I want to lose weight by increasing my physical activity and monitoring my diet more closely.      Depression Screen    12/11/2022    8:36 AM 08/14/2022    8:14 AM 02/20/2022    8:22 AM 11/27/2021    8:58 AM 11/21/2020    8:30 AM 07/22/2020    8:09 AM 11/19/2019    9:43 AM  PHQ 2/9 Scores  PHQ - 2 Score 1 0 0 2 0 0 1  PHQ- 9 Score 13 0 0        Fall Risk    12/11/2022    8:29 AM 08/14/2022    8:14 AM 02/20/2022    8:21 AM 11/27/2021    8:53 AM 11/21/2020    8:26 AM  Fall Risk   Falls in the past year? 1 1 0 1 1  Number falls in past yr: 1 0 0 1 1  Comment 4 falls      Injury with Fall? 0 0 0 0 0  Risk for fall due to :    History of fall(s) History of fall(s)  Follow up Falls evaluation completed;Falls prevention discussed Falls evaluation completed Falls evaluation completed  Falls evaluation completed    MEDICARE RISK AT HOME: Medicare Risk at Home Any stairs in or around the home?: Yes If so, are there any without handrails?: Yes Home free of loose throw rugs in walkways, pet beds, electrical cords, etc?: Yes Adequate lighting in your home to reduce risk of falls?: Yes Life alert?: No Use of a cane, walker or w/c?: No Grab  bars in the bathroom?: No Shower chair or bench in shower?: No Elevated toilet seat or a handicapped toilet?: No  TIMED UP AND GO:  Was the test performed?  No    Cognitive Function:    11/27/2021    9:13 AM 12/24/2019    8:23 AM 11/19/2019    9:47 AM  MMSE - Mini Mental  State Exam  Not completed: Unable to complete  Unable to complete  Orientation to time  5   Orientation to Place  5   Registration  3   Attention/ Calculation  5   Recall  3   Language- name 2 objects  2   Language- repeat  1   Language- follow 3 step command  3   Language- read & follow direction  1   Write a sentence  1   Copy design  1   Total score  30       09/27/2016   11:00 AM  Montreal Cognitive Assessment   Visuospatial/ Executive (0/5) 4  Naming (0/3) 3  Attention: Read list of digits (0/2) 2  Attention: Read list of letters (0/1) 1  Attention: Serial 7 subtraction starting at 100 (0/3) 3  Language: Repeat phrase (0/2) 2  Language : Fluency (0/1) 1  Abstraction (0/2) 2  Delayed Recall (0/5) 5  Orientation (0/6) 6  Total 29      12/11/2022    8:31 AM  6CIT Screen  What Year? 0 points  What month? 0 points  What time? 0 points  Count back from 20 0 points  Months in reverse 0 points  Repeat phrase 0 points  Total Score 0 points    Immunizations Immunization History  Administered Date(s) Administered   Influenza Split 12/07/2011   Influenza,inj,Quad PF,6+ Mos 12/11/2017, 12/09/2018   Influenza-Unspecified 12/21/2015, 01/14/2017, 01/14/2018, 12/26/2018, 02/01/2020, 01/17/2021   Moderna Sars-Covid-2 Vaccination 09/01/2019, 09/29/2019, 04/11/2020   PNEUMOCOCCAL CONJUGATE-20 08/14/2022   Pfizer Covid-19 Vaccine Bivalent Booster 72yrs & up 01/18/2021   Pneumococcal Polysaccharide-23 01/11/2008, 01/05/2016   Tdap 01/06/2012   Zoster, Live 10/12/2015   Zoster, Unspecified 09/07/2014    TDAP status: Due, Education has been provided regarding the importance of this vaccine. Advised may receive this vaccine at local pharmacy or Health Dept. Aware to provide a copy of the vaccination record if obtained from local pharmacy or Health Dept. Verbalized acceptance and understanding.  Flu Vaccine status: Due, Education has been provided regarding the importance of  this vaccine. Advised may receive this vaccine at local pharmacy or Health Dept. Aware to provide a copy of the vaccination record if obtained from local pharmacy or Health Dept. Verbalized acceptance and understanding.  Pneumococcal vaccine status: Up to date  Covid-19 vaccine status: Information provided on how to obtain vaccines.   Qualifies for Shingles Vaccine? Yes   Zostavax completed No   Shingrix Completed?: No.    Education has been provided regarding the importance of this vaccine. Patient has been advised to call insurance company to determine out of pocket expense if they have not yet received this vaccine. Advised may also receive vaccine at local pharmacy or Health Dept. Verbalized acceptance and understanding.  Screening Tests Health Maintenance  Topic Date Due   Zoster Vaccines- Shingrix (1 of 2) 06/02/2007   PAP SMEAR-Modifier  04/16/2017   OPHTHALMOLOGY EXAM  01/12/2020   DTaP/Tdap/Td (2 - Td or Tdap) 01/05/2022   DEXA SCAN  Never done   INFLUENZA VACCINE  11/01/2022   COVID-19 Vaccine (5 -  2023-24 season) 12/02/2022   HEMOGLOBIN A1C  02/14/2023   Diabetic kidney evaluation - eGFR measurement  08/14/2023   Diabetic kidney evaluation - Urine ACR  08/14/2023   FOOT EXAM  08/14/2023   MAMMOGRAM  11/15/2023   Medicare Annual Wellness (AWV)  12/11/2023   Colonoscopy  06/07/2026   Pneumonia Vaccine 59+ Years old  Completed   Hepatitis C Screening  Completed   HIV Screening  Completed   HPV VACCINES  Aged Out    Health Maintenance  Health Maintenance Due  Topic Date Due   Zoster Vaccines- Shingrix (1 of 2) 06/02/2007   PAP SMEAR-Modifier  04/16/2017   OPHTHALMOLOGY EXAM  01/12/2020   DTaP/Tdap/Td (2 - Td or Tdap) 01/05/2022   DEXA SCAN  Never done   INFLUENZA VACCINE  11/01/2022   COVID-19 Vaccine (5 - 2023-24 season) 12/02/2022    Colorectal cancer screening: Type of screening: Colonoscopy. Completed 06/06/2016. Repeat every 10 years  Mammogram status:  Ordered 12/11/2022. Pt provided with contact info and advised to call to schedule appt.   Bone Density status: Ordered 12/11/2022. Pt provided with contact info and advised to call to schedule appt.  Lung Cancer Screening: (Low Dose CT Chest recommended if Age 75-80 years, 20 pack-year currently smoking OR have quit w/in 15years.) does not qualify.   Lung Cancer Screening Referral: N/A  Additional Screening:  Hepatitis C Screening: does qualify; Completed 04/27/2010  Vision Screening: Recommended annual ophthalmology exams for early detection of glaucoma and other disorders of the eye. Is the patient up to date with their annual eye exam?  Yes  Who is the provider or what is the name of the office in which the patient attends annual eye exams? Looking for someone in Network (01/2023). If pt is not established with a provider, would they like to be referred to a provider to establish care? No .   Dental Screening: Recommended annual dental exams for proper oral hygiene  Diabetic Foot Exam: Diabetic Foot Exam: Completed 08/14/2022  Community Resource Referral / Chronic Care Management: CRR required this visit?  Yes   CCM required this visit?  No     Plan:     I have personally reviewed and noted the following in the patient's chart:   Medical and social history Use of alcohol, tobacco or illicit drugs  Current medications and supplements including opioid prescriptions. Patient is not currently taking opioid prescriptions. Functional ability and status Nutritional status Physical activity Advanced directives List of other physicians Hospitalizations, surgeries, and ER visits in previous 12 months Vitals Screenings to include cognitive, depression, and falls Referrals and appointments  In addition, I have reviewed and discussed with patient certain preventive protocols, quality metrics, and best practice recommendations. A written personalized care plan for preventive services  as well as general preventive health recommendations were provided to patient.     Margery Szostak L Jalacia Mattila, CMA   12/11/2022   After Visit Summary: (Mail) Due to this being a telephonic visit, the after visit summary with patients personalized plan was offered to patient via mail   Nurse Notes: Patient is due for Tdap, Flu, Shingles vaccines.  Patient is due for mammogram and DEXA for this year.  I have placed the orders for both and patient is aware to call and get scheduled for the two screenings.  I also placed a community resource referral today, for food.

## 2022-12-11 NOTE — Patient Instructions (Addendum)
Ms. Lord , Thank you for taking time to come for your Medicare Wellness Visit. I appreciate your ongoing commitment to your health goals. Please review the following plan we discussed and let me know if I can assist you in the future.   Referrals/Orders/Follow-Ups/Clinician Recommendations: You are due for a Tetanus, Flu And Shingles vaccine which you can get these at your local pharmacy.  Remember to call [facility] at [phone], to schedule for the mammogram and bone density screenings.  I also placed a [employer] referral for food.  Hopefully you will hear something from someone soon.  It was nice talking you today.  Each day, aim for 6 glasses of water, plenty of protein in your diet and try to get up and walk/ stretch every hour for 5-10 minutes at a time.    This is a list of the screening recommended for you and due dates:  Health Maintenance  Topic Date Due   Zoster (Shingles) Vaccine (1 of 2) 06/02/2007   Pap Smear  04/16/2017   Eye exam for diabetics  01/12/2020   DTaP/Tdap/Td vaccine (2 - Td or Tdap) 01/05/2022   DEXA scan (bone density measurement)  Never done   Flu Shot  11/01/2022   COVID-19 Vaccine (5 - 2023-24 season) 12/02/2022   Hemoglobin A1C  02/14/2023   Yearly kidney function blood test for diabetes  08/14/2023   Yearly kidney health urinalysis for diabetes  08/14/2023   Complete foot exam   08/14/2023   Mammogram  11/15/2023   Medicare Annual Wellness Visit  12/11/2023   Colon Cancer Screening  06/07/2026   Pneumonia Vaccine  Completed   Hepatitis C Screening  Completed   HIV Screening  Completed   HPV Vaccine  Aged Out    Advanced directives: (Copy Requested) Please bring a copy of your health care power of attorney and living will to the office to be added to your chart at your convenience.  Next Medicare Annual Wellness Visit scheduled for next year: Yes

## 2022-12-21 ENCOUNTER — Ambulatory Visit: Payer: Self-pay

## 2022-12-21 NOTE — Patient Instructions (Signed)
Visit Information  Thank you for taking time to visit with me today. Please don't hesitate to contact me if I can be of assistance to you.   Following are the goals we discussed today:  - Review provided resource  -Contact your primary care provider as needed   If you are experiencing a Mental Health or Behavioral Health Crisis or need someone to talk to, please call 1-800-273-TALK (toll free, 24 hour hotline) go to [facility] [address], [place] [phone]) call 911  The patient verbalized understanding of instructions, educational materials, and care plan provided today and DECLINED offer to receive copy of patient instructions, educational materials, and care plan.   No further follow up required: Please contact me as needed  Bevelyn Ngo, BSW, CDP [facility]  [facility], [facility] Social Worker Direct Dial: [phone]  Fax: [phone]

## 2022-12-21 NOTE — Patient Outreach (Signed)
Care Coordination   Initial Visit Note   12/21/2022 Name: Rasheta Farrah MRN: [id number] DOB: [birth year]  Liadan Piche is a 67 y.o. year old female who sees Myrlene Broker, MD for primary care. I spoke with  Moise Boring by phone today.  What matters to the patients health and wellness today?  Identify resources to assist with food insecurity    Goals Addressed             This Visit's Progress    COMPLETED: Care Coordination Activities       Care Coordination Interventions: Discussed the patient has difficulty affording food due to the cost of rent. Patient currently receiving $61 in EBT Determined the patient does visit the [employer] and is interested in visiting other food pantry's as well Provided the patient with a list of [employer] Pantry's          SDOH assessments and interventions completed:  Yes  SDOH Interventions Today    Flowsheet Row Most Recent Value  SDOH Interventions   Food Insecurity Interventions Other (Comment)  [Provided patient with a list of food pantry options,  patient confirms already receiving EBT benefits]        Care Coordination Interventions:  Yes, provided   Interventions Today    Flowsheet Row Most Recent Value  Chronic Disease   Chronic disease during today's visit Other  [Food Insecurity]  General Interventions   General Interventions Discussed/Reviewed General Interventions Discussed, [employer]  [Provided patient with a list of food pantry options]        Follow up plan: No further intervention required.   Encounter Outcome:  Patient Visit Completed   Bevelyn Ngo, BSW, CDP [facility]  [facility], [facility] Social Worker Direct Dial: [phone]  Fax: [phone]

## 2022-12-25 ENCOUNTER — Encounter: Payer: Self-pay | Admitting: Internal Medicine

## 2022-12-25 ENCOUNTER — Ambulatory Visit: Payer: Medicare HMO | Admitting: Internal Medicine

## 2022-12-25 VITALS — BP 136/88 | HR 92 | Temp 97.6°F | Ht 65.0 in | Wt 173.0 lb

## 2022-12-25 DIAGNOSIS — E1169 Type 2 diabetes mellitus with other specified complication: Secondary | ICD-10-CM | POA: Diagnosis not present

## 2022-12-25 DIAGNOSIS — Z7984 Long term (current) use of oral hypoglycemic drugs: Secondary | ICD-10-CM | POA: Diagnosis not present

## 2022-12-25 DIAGNOSIS — L719 Rosacea, unspecified: Secondary | ICD-10-CM | POA: Diagnosis not present

## 2022-12-25 DIAGNOSIS — Z23 Encounter for immunization: Secondary | ICD-10-CM | POA: Diagnosis not present

## 2022-12-25 DIAGNOSIS — K760 Fatty (change of) liver, not elsewhere classified: Secondary | ICD-10-CM

## 2022-12-25 LAB — POCT GLYCOSYLATED HEMOGLOBIN (HGB A1C): HbA1c POC (<> result, manual entry): 7.1 % (ref 4.0–5.6)

## 2022-12-25 MED ORDER — GLIMEPIRIDE 1 MG PO TABS: 1.0000 mg | ORAL_TABLET | Freq: Every day | ORAL | 1 refills | Status: DC

## 2022-12-25 NOTE — Progress Notes (Signed)
Subjective:   Patient ID: Diana Watson, female    DOB: [birth year], 67 y.o.   MRN: [id number]  HPI The patient is a 67 YO female coming in for follow up diabetes.   Review of Systems  Constitutional: Negative.   HENT: Negative.    Eyes: Negative.   Respiratory:  Negative for cough, chest tightness and shortness of breath.   Cardiovascular:  Negative for chest pain, palpitations and leg swelling.  Gastrointestinal:  Negative for abdominal distention, abdominal pain, constipation, diarrhea, nausea and vomiting.  Musculoskeletal: Negative.   Skin: Negative.   Neurological: Negative.   Psychiatric/Behavioral: Negative.      Objective:  Physical Exam Constitutional:      Appearance: She is well-developed.  HENT:     Head: Normocephalic and atraumatic.  Cardiovascular:     Rate and Rhythm: Normal rate and regular rhythm.  Pulmonary:     Effort: Pulmonary effort is normal. No respiratory distress.     Breath sounds: Normal breath sounds. No wheezing or rales.  Abdominal:     General: Bowel sounds are normal. There is no distension.     Palpations: Abdomen is soft.     Tenderness: There is no abdominal tenderness. There is no rebound.  Musculoskeletal:     Cervical back: Normal range of motion.  Skin:    General: Skin is warm and dry.  Neurological:     Mental Status: She is alert and oriented to person, place, and time.     Coordination: Coordination normal.     Vitals:   12/25/22 0826 12/25/22 0919  BP: (!) 136/98 136/88  Pulse: 92   Temp: 97.6 F (36.4 C)   TempSrc: Oral   SpO2: 99%   Weight: 173 lb (78.5 kg)   Height: 5\' 5"  (1.651 m)     Assessment & Plan:  Flu shot given at visit

## 2022-12-25 NOTE — Assessment & Plan Note (Signed)
Improving and she will continue metronidazole gel which has helped significantly.

## 2022-12-25 NOTE — Patient Instructions (Signed)
Your HgA1c is 7.1 so we have sent in amaryl 1 mg to take with first meal of the day.

## 2022-12-25 NOTE — Assessment & Plan Note (Signed)
HgA1c POC done and increased to 7.1%. We decided to initiate medication treatment. She had previously been on medications stopped for 3 years with diet changes. Due to recent stress she has been emotionally eating and unsure if she can stop at this time. Rx amaryl 1 mg daily. She is on statin and ACE-I will continue. Up to date on foot and eye exam.

## 2022-12-25 NOTE — Assessment & Plan Note (Signed)
Continue monitoring CMP every 6-12 months.

## 2023-01-01 ENCOUNTER — Other Ambulatory Visit: Payer: Self-pay | Admitting: Internal Medicine

## 2023-01-02 DIAGNOSIS — F419 Anxiety disorder, unspecified: Secondary | ICD-10-CM | POA: Diagnosis not present

## 2023-01-02 DIAGNOSIS — F3131 Bipolar disorder, current episode depressed, mild: Secondary | ICD-10-CM | POA: Diagnosis not present

## 2023-01-24 DIAGNOSIS — F3131 Bipolar disorder, current episode depressed, mild: Secondary | ICD-10-CM | POA: Diagnosis not present

## 2023-01-24 DIAGNOSIS — F419 Anxiety disorder, unspecified: Secondary | ICD-10-CM | POA: Diagnosis not present

## 2023-01-30 ENCOUNTER — Ambulatory Visit
Admission: RE | Admit: 2023-01-30 | Discharge: 2023-01-30 | Disposition: A | Discharge status: Home / Self Care | Payer: Medicare HMO | Source: Ambulatory Visit | Attending: Internal Medicine | Admitting: Internal Medicine

## 2023-01-30 DIAGNOSIS — Z1231 Encounter for screening mammogram for malignant neoplasm of breast: Secondary | ICD-10-CM

## 2023-01-30 DIAGNOSIS — Z0000 Encounter for general adult medical examination without abnormal findings: Secondary | ICD-10-CM

## 2023-02-04 ENCOUNTER — Encounter: Payer: Self-pay | Admitting: Internal Medicine

## 2023-02-04 LAB — HM MAMMOGRAPHY

## 2023-02-07 ENCOUNTER — Encounter: Payer: Self-pay | Admitting: Internal Medicine

## 2023-02-07 ENCOUNTER — Ambulatory Visit: Payer: Medicare HMO | Admitting: Internal Medicine

## 2023-02-07 VITALS — BP 132/78 | HR 79 | Temp 97.6°F | Ht 65.0 in | Wt 170.0 lb

## 2023-02-07 DIAGNOSIS — E1169 Type 2 diabetes mellitus with other specified complication: Secondary | ICD-10-CM

## 2023-02-07 MED ORDER — GLIMEPIRIDE 2 MG PO TABS: 2.0000 mg | ORAL_TABLET | Freq: Every day | ORAL | 1 refills | Status: DC

## 2023-02-07 NOTE — Progress Notes (Signed)
   Subjective:   Patient ID: Diana Watson, female    DOB: [birth year], 67 y.o.   MRN: [id number]  HPI The patient is a 67 YO female coming in for tingling in some toes and change in vision. Sugars running high and having issues with mania so she is eating more. Working on that with psych without much success.   Review of Systems  Constitutional: Negative.   HENT: Negative.    Eyes:  Positive for visual disturbance.  Respiratory:  Negative for cough, chest tightness and shortness of breath.   Cardiovascular:  Negative for chest pain, palpitations and leg swelling.  Gastrointestinal:  Negative for abdominal distention, abdominal pain, constipation, diarrhea, nausea and vomiting.  Musculoskeletal: Negative.   Skin: Negative.   Neurological:  Positive for numbness.  Psychiatric/Behavioral: Negative.      Objective:  Physical Exam Constitutional:      Appearance: She is well-developed.  HENT:     Head: Normocephalic and atraumatic.  Cardiovascular:     Rate and Rhythm: Normal rate and regular rhythm.  Pulmonary:     Effort: Pulmonary effort is normal. No respiratory distress.     Breath sounds: Normal breath sounds. No wheezing or rales.  Abdominal:     General: Bowel sounds are normal. There is no distension.     Palpations: Abdomen is soft.     Tenderness: There is no abdominal tenderness. There is no rebound.  Musculoskeletal:     Cervical back: Normal range of motion.     Comments: Tingling 3-5th toes on foot  Skin:    General: Skin is warm and dry.  Neurological:     Mental Status: She is alert and oriented to person, place, and time.     Coordination: Coordination normal.     Vitals:   02/07/23 0811  BP: 132/78  Pulse: 79  Temp: 97.6 F (36.4 C)  TempSrc: Oral  SpO2: 99%  Weight: 170 lb (77.1 kg)  Height: 5\' 5"  (1.651 m)    Assessment & Plan:

## 2023-02-07 NOTE — Assessment & Plan Note (Signed)
Vision with some intermittent blurriness likely related to high sugars. She has not tested during episode. Sugars running 180s in morning. Increase amaryl to 2 mg daily. She has upcoming apt with optho. She is not having monocular vision loss and no loss just blurriness which comes and goes.

## 2023-02-07 NOTE — Patient Instructions (Signed)
We have sent in the glimepiride 2 mg daily to take instead. If you have some left over you can take 2 pills of what you have.

## 2023-02-14 DIAGNOSIS — F3131 Bipolar disorder, current episode depressed, mild: Secondary | ICD-10-CM | POA: Diagnosis not present

## 2023-02-21 ENCOUNTER — Telehealth: Payer: Self-pay

## 2023-02-21 NOTE — Patient Outreach (Signed)
Attempted to contact patient regarding care gaps. Left voicemail for patient to return my call at [phone].  Nicholes Rough, CMA Care Guide [employer]

## 2023-02-26 DIAGNOSIS — H5201 Hypermetropia, right eye: Secondary | ICD-10-CM | POA: Diagnosis not present

## 2023-02-26 DIAGNOSIS — H5212 Myopia, left eye: Secondary | ICD-10-CM | POA: Diagnosis not present

## 2023-02-26 DIAGNOSIS — E119 Type 2 diabetes mellitus without complications: Secondary | ICD-10-CM | POA: Diagnosis not present

## 2023-02-26 DIAGNOSIS — H25813 Combined forms of age-related cataract, bilateral: Secondary | ICD-10-CM | POA: Diagnosis not present

## 2023-02-26 DIAGNOSIS — H524 Presbyopia: Secondary | ICD-10-CM | POA: Diagnosis not present

## 2023-02-26 LAB — HM DIABETES EYE EXAM

## 2023-03-07 DIAGNOSIS — F419 Anxiety disorder, unspecified: Secondary | ICD-10-CM | POA: Diagnosis not present

## 2023-03-07 DIAGNOSIS — F3131 Bipolar disorder, current episode depressed, mild: Secondary | ICD-10-CM | POA: Diagnosis not present

## 2023-03-18 ENCOUNTER — Other Ambulatory Visit: Payer: Self-pay | Admitting: Internal Medicine

## 2023-03-18 DIAGNOSIS — E1169 Type 2 diabetes mellitus with other specified complication: Secondary | ICD-10-CM

## 2023-04-11 ENCOUNTER — Ambulatory Visit (INDEPENDENT_AMBULATORY_CARE_PROVIDER_SITE_OTHER): Payer: Medicare HMO | Admitting: Internal Medicine

## 2023-04-11 ENCOUNTER — Encounter: Payer: Self-pay | Admitting: Internal Medicine

## 2023-04-11 VITALS — BP 160/88 | HR 91 | Temp 97.7°F | Ht 65.0 in | Wt 176.0 lb

## 2023-04-11 DIAGNOSIS — Z7984 Long term (current) use of oral hypoglycemic drugs: Secondary | ICD-10-CM | POA: Diagnosis not present

## 2023-04-11 DIAGNOSIS — E1169 Type 2 diabetes mellitus with other specified complication: Secondary | ICD-10-CM | POA: Diagnosis not present

## 2023-04-11 LAB — POCT GLYCOSYLATED HEMOGLOBIN (HGB A1C): HbA1c POC (<> result, manual entry): 7.8 % (ref 4.0–5.6)

## 2023-04-11 MED ORDER — GLIMEPIRIDE 2 MG PO TABS: 4.0000 mg | ORAL_TABLET | Freq: Every day | ORAL | 3 refills | Status: DC

## 2023-04-11 MED ORDER — DEXCOM G7 SENSOR MISC: 11 refills | Status: DC

## 2023-04-11 MED ORDER — DEXCOM G7 RECEIVER DEVI: 0 refills | Status: DC

## 2023-04-11 NOTE — Progress Notes (Signed)
   Subjective:   Patient ID: Diana Watson, female    DOB: 06/13/1955, 68 y.o.   MRN: [id number]  Diabetes Pertinent negatives for diabetes include no chest pain.   The patient is a 68 YO female coming in for medical management. No new concerns. She is struggling with overeating often lately due to her bipolar disorder.   Review of Systems  Constitutional: Negative.   HENT: Negative.    Eyes: Negative.   Respiratory:  Negative for cough, chest tightness and shortness of breath.   Cardiovascular:  Negative for chest pain, palpitations and leg swelling.  Gastrointestinal:  Negative for abdominal distention, abdominal pain, constipation, diarrhea, nausea and vomiting.  Musculoskeletal: Negative.   Skin: Negative.   Neurological: Negative.   Psychiatric/Behavioral: Negative.      Objective:  Physical Exam Constitutional:      Appearance: She is well-developed.  HENT:     Head: Normocephalic and atraumatic.  Cardiovascular:     Rate and Rhythm: Normal rate and regular rhythm.  Pulmonary:     Effort: Pulmonary effort is normal. No respiratory distress.     Breath sounds: Normal breath sounds. No wheezing or rales.  Abdominal:     General: Bowel sounds are normal. There is no distension.     Palpations: Abdomen is soft.     Tenderness: There is no abdominal tenderness. There is no rebound.  Musculoskeletal:     Cervical back: Normal range of motion.  Skin:    General: Skin is warm and dry.  Neurological:     Mental Status: She is alert and oriented to person, place, and time.     Coordination: Coordination normal.     Vitals:   04/11/23 0757 04/11/23 0808  BP: (!) 160/88 (!) 160/88  Pulse: 91   Temp: 97.7 F (36.5 C)   TempSrc: Oral   SpO2: 99%   Weight: 176 lb (79.8 kg)   Height: 5' 5 (1.651 m)     Assessment & Plan:

## 2023-04-11 NOTE — Assessment & Plan Note (Signed)
 POC HgA1c done and significantly elevated from prior but still at goal 7.8 today. We will increase amaryl  to 4 mg daily and she is still in counseling to help with her overeating. Rx dexcom G7 as I think that will help her link eating to higher sugars and provide some additional aversion.

## 2023-04-11 NOTE — Patient Instructions (Addendum)
 We have sent in amaryl to take 2 or 4 mg daily for the sugars.  We have sent in the dexcom. The HgA1c is 7.8 today

## 2023-04-12 ENCOUNTER — Encounter: Payer: Self-pay | Admitting: Internal Medicine

## 2023-04-17 DIAGNOSIS — F419 Anxiety disorder, unspecified: Secondary | ICD-10-CM | POA: Diagnosis not present

## 2023-04-17 DIAGNOSIS — F3131 Bipolar disorder, current episode depressed, mild: Secondary | ICD-10-CM | POA: Diagnosis not present

## 2023-04-24 ENCOUNTER — Telehealth: Payer: Self-pay

## 2023-04-24 NOTE — Telephone Encounter (Signed)
*  Primary  Pharmacy Patient Advocate Encounter   Received notification from Fax that prior authorization for [facility] G7 Receiver device  is required/requested.   Insurance verification completed.   The patient is insured through [employer] .   Per test claim: PA required; PA submitted to above mentioned insurance via CoverMyMeds Key/confirmation #/EOC [id number] Status is pending

## 2023-04-26 NOTE — Telephone Encounter (Signed)
Pharmacy Patient Advocate Encounter  Received notification from [facility] that Prior Authorization for [facility] G7 Receiver device  has been DENIED.  Full denial letter will be uploaded to the media tab. See denial reason below.   PA #/Case ID/Reference #: You asked for the continuous glucose monitor (CGM) system above for your diabetes. The Medicare rule in the Medicare Benefit Policy Manual (Chapter 15, Section 50.4) says a medical device is covered under the Part B (medical) benefit when the use is reasonable and necessary for treating the individual patient. We looked at the major drug guides, medical journals, and standard medical practices to decide if the use is accepted (reasonable and necessary). We also looked at LCD - Glucose Monitors [phone]) ([employer]). The accepted use of this medical device for your condition requires that you meet one of the following: requires treatment with insulin; OR has a documented history of problematic hypoglycemia with documentation of one of the following: More than one level 2 hypoglycemic event (defined as glucose less than 54mg /dL (9.8JXBJ/Y)) that persists despite more than one attempt to adjust medication therapy or modification of diabetes treatment plan OR History of one level 3 hypoglycemic event (glucose less than 54mg /dL (7.8GNFA/O)) characterized by altered mental or physical state requiring hypoglycemia treatment. You do not meet these requirements. Please ask your provider to send [place] new information to show you meet these requirements or tell us why they should not apply to you.

## 2023-05-22 DIAGNOSIS — H25813 Combined forms of age-related cataract, bilateral: Secondary | ICD-10-CM | POA: Diagnosis not present

## 2023-05-22 DIAGNOSIS — E119 Type 2 diabetes mellitus without complications: Secondary | ICD-10-CM | POA: Diagnosis not present

## 2023-05-24 DIAGNOSIS — F419 Anxiety disorder, unspecified: Secondary | ICD-10-CM | POA: Diagnosis not present

## 2023-05-24 DIAGNOSIS — F3131 Bipolar disorder, current episode depressed, mild: Secondary | ICD-10-CM | POA: Diagnosis not present

## 2023-05-30 ENCOUNTER — Ambulatory Visit: Payer: Medicare HMO | Admitting: Internal Medicine

## 2023-05-30 ENCOUNTER — Encounter: Payer: Self-pay | Admitting: Internal Medicine

## 2023-05-30 DIAGNOSIS — Z7984 Long term (current) use of oral hypoglycemic drugs: Secondary | ICD-10-CM

## 2023-05-30 DIAGNOSIS — E1169 Type 2 diabetes mellitus with other specified complication: Secondary | ICD-10-CM

## 2023-05-30 MED ORDER — SEMAGLUTIDE (1 MG/DOSE) 4 MG/3ML ~~LOC~~ SOPN: 1.0000 mg | PEN_INJECTOR | SUBCUTANEOUS | 2 refills | Status: DC

## 2023-05-30 MED ORDER — OZEMPIC (0.25 OR 0.5 MG/DOSE) 2 MG/3ML ~~LOC~~ SOPN: PEN_INJECTOR | SUBCUTANEOUS | 0 refills | Status: DC

## 2023-05-30 MED ORDER — ACCU-CHEK AVIVA PLUS VI STRP: ORAL_STRIP | 3 refills | Status: DC

## 2023-05-30 MED ORDER — RYBELSUS 7 MG PO TABS: 7.0000 mg | ORAL_TABLET | Freq: Every day | ORAL | 0 refills | Status: DC

## 2023-05-30 NOTE — Progress Notes (Unsigned)
   Subjective:   Patient ID: Diana Watson, female    DOB: Nov 17, 1955, 68 y.o.   MRN: [id number]  HPI The patient is a 68 YO female struggling with food cravings and this is causing her diabetes to be out of control. Sugars in the 200-300 throughout the day and in the morning often high 100 to 200s.   Review of Systems  Constitutional: Negative.   HENT: Negative.    Eyes: Negative.   Respiratory:  Negative for cough, chest tightness and shortness of breath.   Cardiovascular:  Negative for chest pain, palpitations and leg swelling.  Gastrointestinal:  Negative for abdominal distention, abdominal pain, constipation, diarrhea, nausea and vomiting.  Musculoskeletal: Negative.   Skin: Negative.   Neurological: Negative.   Psychiatric/Behavioral: Negative.      Objective:  Physical Exam Constitutional:      Appearance: She is well-developed.  HENT:     Head: Normocephalic and atraumatic.  Cardiovascular:     Rate and Rhythm: Normal rate and regular rhythm.  Pulmonary:     Effort: Pulmonary effort is normal. No respiratory distress.     Breath sounds: Normal breath sounds. No wheezing or rales.  Abdominal:     General: Bowel sounds are normal. There is no distension.     Palpations: Abdomen is soft.     Tenderness: There is no abdominal tenderness. There is no rebound.  Musculoskeletal:     Cervical back: Normal range of motion.  Skin:    General: Skin is warm and dry.  Neurological:     Mental Status: She is alert and oriented to person, place, and time.     Coordination: Coordination normal.     Vitals:   05/30/23 0833  BP: (!) 140/100  Pulse: 80  Temp: 98 F (36.7 C)  TempSrc: Oral  SpO2: 98%  Weight: 178 lb (80.7 kg)  Height: 5\' 5"  (1.651 m)    Assessment & Plan:  Visit time 25 minutes in face to face communication with patient and coordination of care, additional 5 minutes spent in record review, coordination or care, ordering tests,  communicating/referring to other healthcare professionals, documenting in medical records all on the same day of the visit for total time 30 minutes spent on the visit.

## 2023-05-30 NOTE — Patient Instructions (Addendum)
 We have sent in rybelsus to take 1/2 pill daily for the first week. Then increase to 1 pill daily (7 mg) for the first month. We will see you back in about 1 month and if doing okay will increase to the full dose.

## 2023-05-31 ENCOUNTER — Encounter: Payer: Self-pay | Admitting: Internal Medicine

## 2023-05-31 NOTE — Assessment & Plan Note (Addendum)
 Sugars in log are uncontrolled and prior HgA1c was rising. Will start rybelsus 3.5 mg daily for 1 week then 7 mg daily for 1 month then follow up. I am hopeful this will help but feel some part of this is related to her mental health and meds. Continue metformin as well. Increase amount of testing strips per day since CGM was not approved.

## 2023-06-11 ENCOUNTER — Other Ambulatory Visit (HOSPITAL_COMMUNITY): Payer: Self-pay

## 2023-06-11 ENCOUNTER — Telehealth: Payer: Self-pay | Admitting: Pharmacy Technician

## 2023-06-11 NOTE — Telephone Encounter (Signed)
 Pharmacy Patient Advocate Encounter   Received notification from CoverMyMeds that prior authorization for ACCU-CHEK AVIVA PLUS TEST STRIP is required/requested.   Insurance verification completed.   The patient is insured through [employer] .   Per test claim: THE MAX DAY SUPPLY IS 5 TIMES DAILY AND INSURANCE WILL PAY FOR 150 TEST STRIPS EVERY 30 DAYS. PATIENTS COPAY WAS $0.00 TODAY AFTER RUNNING A TEST CLAIM

## 2023-06-11 NOTE — Telephone Encounter (Signed)
 Copied from CRM [id number]. Topic: Clinical - Prescription Issue >> Jun 11, 2023  9:44 AM [place] J wrote: Reason for CRM: Amy Pharmacologist) calling in from [facility] regarding a prior authorization needed for Accu-Chek Aviva Plus test strips.

## 2023-06-12 ENCOUNTER — Other Ambulatory Visit (HOSPITAL_COMMUNITY): Payer: Self-pay

## 2023-06-12 NOTE — Telephone Encounter (Signed)
 Mallory from [facility] is calling in to discuss patient acru check that was prescribed on feb 27th call back number  [phone]

## 2023-06-19 NOTE — Telephone Encounter (Signed)
 I have called and went over this with them

## 2023-06-21 ENCOUNTER — Other Ambulatory Visit: Payer: Self-pay

## 2023-06-21 DIAGNOSIS — E1169 Type 2 diabetes mellitus with other specified complication: Secondary | ICD-10-CM

## 2023-06-21 MED ORDER — ACCU-CHEK AVIVA PLUS VI STRP
ORAL_STRIP | 3 refills | Status: DC
Start: 2023-06-21 — End: 2023-09-05

## 2023-06-21 MED ORDER — DEXCOM G7 RECEIVER DEVI: 0 refills | Status: DC

## 2023-06-21 NOTE — Telephone Encounter (Signed)
 This has been changed and updated and sent back in

## 2023-06-21 NOTE — Telephone Encounter (Signed)
 Copied from CRM [id number]. Topic: Clinical - Prescription Issue >> Jun 21, 2023  8:25 AM [place] E wrote: Reason for CRM: Patient called in regarding her glucose blood (ACCU-CHEK AVIVA PLUS) test strips. Patient stated her plan will not cover the 6 strips per day, but will cover 5 strips per day. Patient questioning if PCP can change the order to test blood sugar 5 times a day.

## 2023-06-26 ENCOUNTER — Telehealth: Payer: Self-pay

## 2023-06-26 NOTE — Progress Notes (Signed)
 Opened in error

## 2023-06-26 NOTE — Progress Notes (Signed)
 This patient is appearing on a report for being at risk of failing the adherence measure for cholesterol (statin) and hypertension (ACEi/ARB) medications this calendar year.   Medication: Lisinopril 30mg  daily Last fill date: 05/29/2023 for 90 day supply  Medication: Rosuvastatin 5mg  daily Last fill date: 05/29/2023 for 90 day supply  No intervention needed.   Verdene Rio, PharmD PGY1 Pharmacy Resident

## 2023-06-26 NOTE — Progress Notes (Signed)
 This patient is appearing on a report for being at risk of failing the adherence measure for cholesterol (statin) and hypertension (ACEi/ARB) medications this calendar year.   Medication: Rosuvastatin 5mg  daily  Last fill date: 05/29/2023 for 90 day supply  Medication: Lisinopril 30mg  daily  Last fill date: 05/29/2023 for 90 day supply   No interventions needed at this time.   Verdene Rio, PharmD PGY1 Pharmacy Resident

## 2023-07-01 ENCOUNTER — Other Ambulatory Visit: Payer: Self-pay | Admitting: Internal Medicine

## 2023-07-05 ENCOUNTER — Other Ambulatory Visit: Payer: Self-pay | Admitting: Internal Medicine

## 2023-07-05 NOTE — Telephone Encounter (Signed)
 Copied from CRM [id number]. Topic: Clinical - Medication Refill >> Jul 05, 2023  7:41 AM Alcus Dad wrote: Most Recent Primary Care Visit:  Provider: Hillard Danker A  Department: [facility]  Visit Type: ACUTE  Date: 05/30/2023  Medication: Semaglutide (RYBELSUS) 7 MG TABS  Has the patient contacted their pharmacy? Yes (Agent: If no, request that the patient contact the pharmacy for the refill. If patient does not wish to contact the pharmacy document the reason why and proceed with request.) (Agent: If yes, when and what did the pharmacy advise?)  Is this the correct pharmacy for this prescription? Yes If no, delete pharmacy and type the correct one.  This is the patient's preferred pharmacy:  [facility] - [place], [place] - [address] [place] [address] Phone: [phone] Fax: [phone]  Has the prescription been filled recently? No  Is the patient out of the medication? No  Has the patient been seen for an appointment in the last year OR does the patient have an upcoming appointment? Yes  Can we respond through MyChart? No  Agent: Please be advised that Rx refills may take up to 3 business days. We ask that you follow-up with your pharmacy.

## 2023-07-09 DIAGNOSIS — F3131 Bipolar disorder, current episode depressed, mild: Secondary | ICD-10-CM | POA: Diagnosis not present

## 2023-07-09 DIAGNOSIS — F419 Anxiety disorder, unspecified: Secondary | ICD-10-CM | POA: Diagnosis not present

## 2023-07-12 ENCOUNTER — Other Ambulatory Visit: Payer: Self-pay

## 2023-07-12 ENCOUNTER — Ambulatory Visit: Admitting: Internal Medicine

## 2023-07-12 NOTE — Progress Notes (Signed)
 In error.

## 2023-07-26 ENCOUNTER — Ambulatory Visit: Admitting: Internal Medicine

## 2023-07-26 ENCOUNTER — Encounter: Payer: Self-pay | Admitting: Internal Medicine

## 2023-07-26 VITALS — BP 146/80 | HR 66 | Temp 97.5°F | Ht 65.0 in | Wt 165.0 lb

## 2023-07-26 DIAGNOSIS — E1169 Type 2 diabetes mellitus with other specified complication: Secondary | ICD-10-CM | POA: Diagnosis not present

## 2023-07-26 DIAGNOSIS — Z7984 Long term (current) use of oral hypoglycemic drugs: Secondary | ICD-10-CM | POA: Diagnosis not present

## 2023-07-26 LAB — POCT GLYCOSYLATED HEMOGLOBIN (HGB A1C): HbA1c POC (<> result, manual entry): 6.5 % (ref 4.0–5.6)

## 2023-07-26 MED ORDER — RYBELSUS 14 MG PO TABS: 14.0000 mg | ORAL_TABLET | Freq: Every day | ORAL | 1 refills | Status: DC

## 2023-07-26 NOTE — Progress Notes (Signed)
   Subjective:   Patient ID: Diana Watson, female    DOB: 24-Jun-1955, 68 y.o.   MRN: [id number]  HPI The patient is a 68 YO female coming in for medical management (see A/P for details).   Review of Systems  Constitutional: Negative.   HENT: Negative.    Eyes: Negative.   Respiratory:  Negative for cough, chest tightness and shortness of breath.   Cardiovascular:  Negative for chest pain, palpitations and leg swelling.  Gastrointestinal:  Negative for abdominal distention, abdominal pain, constipation, diarrhea, nausea and vomiting.  Musculoskeletal: Negative.   Skin: Negative.   Neurological: Negative.   Psychiatric/Behavioral: Negative.      Objective:  Physical Exam Constitutional:      Appearance: She is well-developed.  HENT:     Head: Normocephalic and atraumatic.  Cardiovascular:     Rate and Rhythm: Normal rate and regular rhythm.  Pulmonary:     Effort: Pulmonary effort is normal. No respiratory distress.     Breath sounds: Normal breath sounds. No wheezing or rales.  Abdominal:     General: Bowel sounds are normal. There is no distension.     Palpations: Abdomen is soft.     Tenderness: There is no abdominal tenderness. There is no rebound.  Musculoskeletal:     Cervical back: Normal range of motion.  Skin:    General: Skin is warm and dry.  Neurological:     Mental Status: She is alert and oriented to person, place, and time.     Coordination: Coordination normal.     Vitals:   07/26/23 0743  BP: (!) 146/80  Pulse: 66  Temp: (!) 97.5 F (36.4 C)  TempSrc: Oral  SpO2: 94%  Weight: 165 lb (74.8 kg)  Height: 5\' 5"  (1.651 m)    Assessment & Plan:

## 2023-07-26 NOTE — Assessment & Plan Note (Signed)
 POc Hga1c done and significantly improved with rybelsus . Tolerating 7 mg well increasing to 14 mg daily today new rx done. Will continue amaryl  4 mg daily for now and at next check if still improved can consider change to regimen

## 2023-07-26 NOTE — Patient Instructions (Addendum)
 We have sent in the rybelsus  to the pharmacy again  Get the RSV at the pharmacy.  Get the shingles vaccine shingrix at the pharmacy.

## 2023-08-12 DIAGNOSIS — H25813 Combined forms of age-related cataract, bilateral: Secondary | ICD-10-CM | POA: Diagnosis not present

## 2023-08-22 DIAGNOSIS — E1136 Type 2 diabetes mellitus with diabetic cataract: Secondary | ICD-10-CM | POA: Diagnosis not present

## 2023-08-22 DIAGNOSIS — H25811 Combined forms of age-related cataract, right eye: Secondary | ICD-10-CM | POA: Diagnosis not present

## 2023-08-23 DIAGNOSIS — H25812 Combined forms of age-related cataract, left eye: Secondary | ICD-10-CM | POA: Diagnosis not present

## 2023-08-23 DIAGNOSIS — Z48810 Encounter for surgical aftercare following surgery on the sense organs: Secondary | ICD-10-CM | POA: Diagnosis not present

## 2023-08-23 DIAGNOSIS — E1136 Type 2 diabetes mellitus with diabetic cataract: Secondary | ICD-10-CM | POA: Diagnosis not present

## 2023-08-23 DIAGNOSIS — E119 Type 2 diabetes mellitus without complications: Secondary | ICD-10-CM | POA: Diagnosis not present

## 2023-08-23 DIAGNOSIS — Z9841 Cataract extraction status, right eye: Secondary | ICD-10-CM | POA: Diagnosis not present

## 2023-08-23 DIAGNOSIS — Z961 Presence of intraocular lens: Secondary | ICD-10-CM | POA: Diagnosis not present

## 2023-08-28 DIAGNOSIS — Z48810 Encounter for surgical aftercare following surgery on the sense organs: Secondary | ICD-10-CM | POA: Diagnosis not present

## 2023-08-28 DIAGNOSIS — Z961 Presence of intraocular lens: Secondary | ICD-10-CM | POA: Diagnosis not present

## 2023-08-28 DIAGNOSIS — E1136 Type 2 diabetes mellitus with diabetic cataract: Secondary | ICD-10-CM | POA: Diagnosis not present

## 2023-08-28 DIAGNOSIS — E119 Type 2 diabetes mellitus without complications: Secondary | ICD-10-CM | POA: Diagnosis not present

## 2023-08-28 DIAGNOSIS — Z9841 Cataract extraction status, right eye: Secondary | ICD-10-CM | POA: Diagnosis not present

## 2023-08-28 DIAGNOSIS — H25812 Combined forms of age-related cataract, left eye: Secondary | ICD-10-CM | POA: Diagnosis not present

## 2023-09-05 ENCOUNTER — Other Ambulatory Visit: Payer: Self-pay | Admitting: Internal Medicine

## 2023-09-05 DIAGNOSIS — E1169 Type 2 diabetes mellitus with other specified complication: Secondary | ICD-10-CM

## 2023-09-05 DIAGNOSIS — H25812 Combined forms of age-related cataract, left eye: Secondary | ICD-10-CM | POA: Diagnosis not present

## 2023-09-05 DIAGNOSIS — E1136 Type 2 diabetes mellitus with diabetic cataract: Secondary | ICD-10-CM | POA: Diagnosis not present

## 2023-09-06 DIAGNOSIS — Z9841 Cataract extraction status, right eye: Secondary | ICD-10-CM | POA: Diagnosis not present

## 2023-09-06 DIAGNOSIS — Z9842 Cataract extraction status, left eye: Secondary | ICD-10-CM | POA: Diagnosis not present

## 2023-09-06 DIAGNOSIS — E119 Type 2 diabetes mellitus without complications: Secondary | ICD-10-CM | POA: Diagnosis not present

## 2023-09-06 DIAGNOSIS — Z961 Presence of intraocular lens: Secondary | ICD-10-CM | POA: Diagnosis not present

## 2023-09-20 DIAGNOSIS — E119 Type 2 diabetes mellitus without complications: Secondary | ICD-10-CM | POA: Diagnosis not present

## 2023-09-20 DIAGNOSIS — Z9842 Cataract extraction status, left eye: Secondary | ICD-10-CM | POA: Diagnosis not present

## 2023-09-20 DIAGNOSIS — Z961 Presence of intraocular lens: Secondary | ICD-10-CM | POA: Diagnosis not present

## 2023-09-20 DIAGNOSIS — Z9841 Cataract extraction status, right eye: Secondary | ICD-10-CM | POA: Diagnosis not present

## 2023-10-01 ENCOUNTER — Ambulatory Visit (INDEPENDENT_AMBULATORY_CARE_PROVIDER_SITE_OTHER): Admitting: Internal Medicine

## 2023-10-01 ENCOUNTER — Encounter: Payer: Self-pay | Admitting: Internal Medicine

## 2023-10-01 VITALS — BP 132/82 | HR 88 | Temp 97.9°F | Ht 65.0 in | Wt 151.0 lb

## 2023-10-01 DIAGNOSIS — E1169 Type 2 diabetes mellitus with other specified complication: Secondary | ICD-10-CM | POA: Diagnosis not present

## 2023-10-01 DIAGNOSIS — R21 Rash and other nonspecific skin eruption: Secondary | ICD-10-CM | POA: Insufficient documentation

## 2023-10-01 MED ORDER — BETAMETHASONE VALERATE 0.1 % EX OINT
1.0000 | TOPICAL_OINTMENT | Freq: Every day | CUTANEOUS | 0 refills | Status: DC
Start: 2023-10-01 — End: 2023-11-21

## 2023-10-01 MED ORDER — DEXCOM G7 SENSOR MISC: 11 refills | Status: DC

## 2023-10-01 MED ORDER — DEXCOM G7 RECEIVER DEVI: 0 refills | Status: DC

## 2023-10-01 NOTE — Patient Instructions (Signed)
 We have sent in the betamethasone for the face to use daily for 1-2 weeks.  We have sent in the G7 to see if they will approve this.

## 2023-10-01 NOTE — Assessment & Plan Note (Signed)
 Likely allergic reaction from cloth material from cataract procedure. Rx betamethasone to use for 1-2 weeks. If no resolution can do zyrtec TID.

## 2023-10-01 NOTE — Progress Notes (Signed)
   Subjective:   Patient ID: Diana Watson, female    DOB: [birth year], 68 y.o.   MRN: [id number]  HPI The patient is a 68 YO female coming in for rash on the face for several weeks. She had cataract surgery with a cloth on her face which is when it started then 2 weeks later did the other eye. She has tried some benadryl which has not helped. This is not itching or hurting. She denies swelling or spread of the rash.   Review of Systems  Constitutional: Negative.   HENT: Negative.    Eyes: Negative.   Respiratory:  Negative for cough, chest tightness and shortness of breath.   Cardiovascular:  Negative for chest pain, palpitations and leg swelling.  Gastrointestinal:  Negative for abdominal distention, abdominal pain, constipation, diarrhea, nausea and vomiting.  Musculoskeletal: Negative.   Skin:  Positive for rash.  Neurological: Negative.   Psychiatric/Behavioral: Negative.      Objective:  Physical Exam Constitutional:      Appearance: She is well-developed.  HENT:     Head: Normocephalic and atraumatic.     Comments: Red bumps on the forehead and left and right cheeks. Not on chin, no lip or mouth swelling.   Cardiovascular:     Rate and Rhythm: Normal rate and regular rhythm.  Pulmonary:     Effort: Pulmonary effort is normal. No respiratory distress.     Breath sounds: Normal breath sounds. No wheezing or rales.  Abdominal:     General: Bowel sounds are normal. There is no distension.     Palpations: Abdomen is soft.     Tenderness: There is no abdominal tenderness. There is no rebound.   Musculoskeletal:     Cervical back: Normal range of motion.   Skin:    General: Skin is warm and dry.   Neurological:     Mental Status: She is alert and oriented to person, place, and time.     Coordination: Coordination normal.     Vitals:   10/01/23 0825  BP: 132/82  Pulse: 88  Temp: 97.9 F (36.6 C)  TempSrc: Oral  SpO2: 98%  Weight: 151 lb (68.5 kg)  Height:  5' 5 (1.651 m)    Assessment & Plan:

## 2023-10-01 NOTE — Assessment & Plan Note (Signed)
 She is having a lot of variability in sugars and checking sugars >4 times a day due to this. She is prescribed G7 due to lability of sugars. She is taking amaryl  and rybelsus  and at risk for low sugars.

## 2023-10-02 ENCOUNTER — Telehealth: Payer: Self-pay

## 2023-10-02 ENCOUNTER — Other Ambulatory Visit (HOSPITAL_COMMUNITY): Payer: Self-pay

## 2023-10-02 NOTE — Telephone Encounter (Signed)
 Pharmacy Patient Advocate Encounter   Received notification from Onbase that prior authorization for Dexcom G7 Sensor  is required/requested.   Insurance verification completed.   The patient is insured through [place] .   Per test claim: PA required; PA submitted to above mentioned insurance via CoverMyMeds Key/confirmation #/EOC [id number] Status is pending

## 2023-10-07 ENCOUNTER — Telehealth: Payer: Self-pay

## 2023-10-07 NOTE — Telephone Encounter (Signed)
 Copied from CRM [id number]. Topic: Clinical - Prescription Issue >> Oct 07, 2023  1:50 PM Diana Watson wrote: Reason for CRM: Patient said her provider called in a prescription for: betamethasone  valerate ointment (VALISONE ) 0.1 % [[id number]] last Tuesday July 1st and [facility] had a question regarding the medication. Requesting you to contact them   [phone]

## 2023-10-07 NOTE — Telephone Encounter (Signed)
 Pharmacy Patient Advocate Encounter  Received notification from [employer] that Prior Authorization for Dexcom G7 Sensor  has been DENIED.  Full denial letter will be uploaded to the media tab. See denial reason below.   PA #/Case ID/Reference #: [id number]

## 2023-10-08 DIAGNOSIS — F419 Anxiety disorder, unspecified: Secondary | ICD-10-CM | POA: Diagnosis not present

## 2023-10-08 DIAGNOSIS — F3131 Bipolar disorder, current episode depressed, mild: Secondary | ICD-10-CM | POA: Diagnosis not present

## 2023-10-08 NOTE — Telephone Encounter (Signed)
 No she already has this.

## 2023-10-10 ENCOUNTER — Telehealth: Payer: Self-pay | Admitting: Internal Medicine

## 2023-10-10 NOTE — Telephone Encounter (Signed)
 Called [facility] and was on hold for 30 minutes will have to call back

## 2023-10-10 NOTE — Telephone Encounter (Signed)
 Copied from CRM [id number]. Topic: Clinical - Medication Question >> Oct 10, 2023  2:14 PM Franky GRADE wrote: Reason for CRM: Harlene from [facility] is calling for clarification on the betamethasone  valerate ointment (VALISONE ) 0.1 % [[id number]] prescription they received.

## 2023-10-14 NOTE — Telephone Encounter (Signed)
Ok to dispense

## 2023-10-14 NOTE — Telephone Encounter (Signed)
 Called [facility] and they state bc pt has prednisone allergy and the ointment does have prednisone in it they d/c order and are wanting to check if its ok to dispense to pt. If ok they will need new rx sent

## 2023-10-15 NOTE — Telephone Encounter (Signed)
 Called back [facility] and gave verbal orders ok to dispense ointment to pharmacist. She notes they will fill rx.

## 2023-10-18 NOTE — Telephone Encounter (Unsigned)
 Copied from CRM [id number]. Topic: Clinical - Prescription Issue >> Oct 18, 2023  8:15 AM Antwanette L wrote: Reason for CRM: Patient is calling to let Dr. Rollene know that [facility] will refill betamethasone  valerate ointment (VALISONE ) 0.1 % due to a possible allergic reaction. [facility] phone number is [phone]

## 2023-10-22 ENCOUNTER — Other Ambulatory Visit: Payer: Self-pay | Admitting: Internal Medicine

## 2023-10-22 DIAGNOSIS — Z1231 Encounter for screening mammogram for malignant neoplasm of breast: Secondary | ICD-10-CM

## 2023-10-22 NOTE — Telephone Encounter (Signed)
 Copied from CRM [id number]. Topic: Clinical - Prescription Issue >> Oct 22, 2023 12:25 PM Rosina BIRCH wrote:  Patient called stating [facility] does not want to fill the betamethasone  valerate ointment because the patient is allergic to prednisone and it can cause a potential of sensitivity [facility]# [phone]

## 2023-10-23 ENCOUNTER — Other Ambulatory Visit: Payer: Self-pay | Admitting: Internal Medicine

## 2023-10-23 NOTE — Telephone Encounter (Signed)
**Note De-identified  [place] Obfuscation** Please advise 

## 2023-10-25 DIAGNOSIS — Z9842 Cataract extraction status, left eye: Secondary | ICD-10-CM | POA: Diagnosis not present

## 2023-10-25 DIAGNOSIS — Z9841 Cataract extraction status, right eye: Secondary | ICD-10-CM | POA: Diagnosis not present

## 2023-10-25 DIAGNOSIS — E119 Type 2 diabetes mellitus without complications: Secondary | ICD-10-CM | POA: Diagnosis not present

## 2023-10-25 DIAGNOSIS — Z961 Presence of intraocular lens: Secondary | ICD-10-CM | POA: Diagnosis not present

## 2023-11-01 NOTE — Telephone Encounter (Signed)
 Both number are incorrect and I have spoke with 3 different departments who all could not help to see if this patient medication has been sent out

## 2023-11-08 ENCOUNTER — Encounter: Payer: Self-pay | Admitting: Internal Medicine

## 2023-11-08 ENCOUNTER — Ambulatory Visit: Admitting: Internal Medicine

## 2023-11-08 VITALS — BP 116/82 | Temp 97.8°F | Ht 65.0 in | Wt 143.0 lb

## 2023-11-08 DIAGNOSIS — F0390 Unspecified dementia without behavioral disturbance: Secondary | ICD-10-CM | POA: Diagnosis not present

## 2023-11-08 DIAGNOSIS — I10 Essential (primary) hypertension: Secondary | ICD-10-CM

## 2023-11-08 DIAGNOSIS — Z7984 Long term (current) use of oral hypoglycemic drugs: Secondary | ICD-10-CM | POA: Diagnosis not present

## 2023-11-08 DIAGNOSIS — R7989 Other specified abnormal findings of blood chemistry: Secondary | ICD-10-CM | POA: Diagnosis not present

## 2023-11-08 DIAGNOSIS — F319 Bipolar disorder, unspecified: Secondary | ICD-10-CM | POA: Diagnosis not present

## 2023-11-08 DIAGNOSIS — E785 Hyperlipidemia, unspecified: Secondary | ICD-10-CM

## 2023-11-08 DIAGNOSIS — Z0000 Encounter for general adult medical examination without abnormal findings: Secondary | ICD-10-CM

## 2023-11-08 DIAGNOSIS — K760 Fatty (change of) liver, not elsewhere classified: Secondary | ICD-10-CM | POA: Diagnosis not present

## 2023-11-08 DIAGNOSIS — E1169 Type 2 diabetes mellitus with other specified complication: Secondary | ICD-10-CM | POA: Diagnosis not present

## 2023-11-08 LAB — COMPREHENSIVE METABOLIC PANEL WITH GFR
ALT: 25 U/L (ref 0–35)
AST: 25 U/L (ref 0–37)
Albumin: 4.8 g/dL (ref 3.5–5.2)
Alkaline Phosphatase: 56 U/L (ref 39–117)
BUN: 17 mg/dL (ref 6–23)
CO2: 31 meq/L (ref 19–32)
Calcium: 10 mg/dL (ref 8.4–10.5)
Chloride: 100 meq/L (ref 96–112)
Creatinine, Ser: 1 mg/dL (ref 0.40–1.20)
GFR: 58.74 mL/min — ABNORMAL LOW (ref >60.00)
Glucose, Bld: 94 mg/dL (ref 70–99)
Potassium: 4.7 meq/L (ref 3.5–5.1)
Sodium: 140 meq/L (ref 135–145)
Total Bilirubin: 0.3 mg/dL (ref 0.2–1.2)
Total Protein: 8.2 g/dL (ref 6.0–8.3)

## 2023-11-08 LAB — LIPID PANEL
Cholesterol: 131 mg/dL (ref 0–200)
HDL: 64.9 mg/dL (ref >39.00)
LDL Cholesterol: 54 mg/dL (ref 0–99)
NonHDL: 65.73
Total CHOL/HDL Ratio: 2
Triglycerides: 61 mg/dL (ref 0.0–149.0)
VLDL: 12.2 mg/dL (ref 0.0–40.0)

## 2023-11-08 LAB — CBC
HCT: 40.2 % (ref 36.0–46.0)
Hemoglobin: 13.4 g/dL (ref 12.0–15.0)
MCHC: 33.2 g/dL (ref 30.0–36.0)
MCV: 96.7 fl (ref 78.0–100.0)
Platelets: 170 K/uL (ref 150.0–400.0)
RBC: 4.16 Mil/uL (ref 3.87–5.11)
RDW: 12.7 % (ref 11.5–15.5)
WBC: 4.2 K/uL (ref 4.0–10.5)

## 2023-11-08 LAB — MICROALBUMIN / CREATININE URINE RATIO
Creatinine,U: 174.4 mg/dL
Microalb Creat Ratio: 27.2 mg/g (ref 0.0–30.0)
Microalb, Ur: 4.7 mg/dL — ABNORMAL HIGH (ref 0.0–1.9)

## 2023-11-08 LAB — TSH: TSH: 1.83 u[IU]/mL (ref 0.35–5.50)

## 2023-11-08 LAB — HEMOGLOBIN A1C: Hgb A1c MFr Bld: 6.1 % (ref 4.6–6.5)

## 2023-11-08 LAB — T4, FREE: Free T4: 0.85 ng/dL (ref 0.60–1.60)

## 2023-11-08 MED ORDER — LISINOPRIL 30 MG PO TABS: 60.0000 mg | ORAL_TABLET | Freq: Every day | ORAL | 3 refills | Status: AC

## 2023-11-08 MED ORDER — RYBELSUS 14 MG PO TABS: 14.0000 mg | ORAL_TABLET | Freq: Every day | ORAL | 1 refills | Status: DC

## 2023-11-08 MED ORDER — ACCU-CHEK AVIVA PLUS VI STRP: ORAL_STRIP | 3 refills | Status: AC

## 2023-11-08 MED ORDER — GLIMEPIRIDE 2 MG PO TABS: 4.0000 mg | ORAL_TABLET | Freq: Every day | ORAL | 3 refills | Status: AC

## 2023-11-08 MED ORDER — ROSUVASTATIN CALCIUM 5 MG PO TABS: 5.0000 mg | ORAL_TABLET | Freq: Every day | ORAL | 3 refills | Status: AC

## 2023-11-08 MED ORDER — ALCOHOL SWABS PADS: MEDICATED_PAD | 3 refills | Status: DC

## 2023-11-08 NOTE — Assessment & Plan Note (Signed)
 Stable overall and seeing mental health. Is not in depressive or manic phase at this time.

## 2023-11-08 NOTE — Assessment & Plan Note (Signed)
 Checking UACR, lipid panel, CMP, HgA1c and adjust as needed rybelsus  and amaryl  and on ACE-I and statin.

## 2023-11-08 NOTE — Assessment & Plan Note (Signed)
 Handling well and does have impact on being unable to work. She is able to manage medications and iADLS.

## 2023-11-08 NOTE — Assessment & Plan Note (Signed)
Checking TSH and free T4 and adjust as needed.  

## 2023-11-08 NOTE — Assessment & Plan Note (Signed)
Checking CMP for stability.  

## 2023-11-08 NOTE — Assessment & Plan Note (Signed)
 Checking lipid panel and adjust as needed.

## 2023-11-08 NOTE — Assessment & Plan Note (Signed)
 Flu shot yearly. Pneumonia complete. Shingrix complete. Tetanus due at pharmacy. Colonoscopy up to date. Mammogram up to date, pap smear aged out and dexa complete. Counseled about sun safety and mole surveillance. Counseled about the dangers of distracted driving. Given 10 year screening recommendations.

## 2023-11-08 NOTE — Progress Notes (Signed)
   Subjective:   Patient ID: Diana Watson, female    DOB: Apr 18, 1955, 68 y.o.   MRN: [id number]  The patient is here for physical. Pertinent topics discussed:   PMH, [facility], social history reviewed and updated  Review of Systems  Constitutional: Negative.   HENT: Negative.    Eyes: Negative.   Respiratory:  Negative for cough, chest tightness and shortness of breath.   Cardiovascular:  Negative for chest pain, palpitations and leg swelling.  Gastrointestinal:  Negative for abdominal distention, abdominal pain, constipation, diarrhea, nausea and vomiting.  Musculoskeletal: Negative.   Skin: Negative.   Neurological: Negative.   Psychiatric/Behavioral: Negative.      Objective:  Physical Exam Constitutional:      Appearance: She is well-developed.  HENT:     Head: Normocephalic and atraumatic.  Cardiovascular:     Rate and Rhythm: Normal rate and regular rhythm.  Pulmonary:     Effort: Pulmonary effort is normal. No respiratory distress.     Breath sounds: Normal breath sounds. No wheezing or rales.  Abdominal:     General: Bowel sounds are normal. There is no distension.     Palpations: Abdomen is soft.     Tenderness: There is no abdominal tenderness. There is no rebound.  Musculoskeletal:     Cervical back: Normal range of motion.  Skin:    General: Skin is warm and dry.  Neurological:     Mental Status: She is alert and oriented to person, place, and time.     Coordination: Coordination normal.     Vitals:   11/08/23 0751  BP: 116/82  Temp: 97.8 F (36.6 C)  TempSrc: Oral  SpO2: 92%  Weight: 143 lb (64.9 kg)  Height: 5' 5 (1.651 m)    Assessment & Plan:

## 2023-11-08 NOTE — Assessment & Plan Note (Signed)
BP at goal on lisinopril and checking CMP and adjust as needed.  

## 2023-11-11 ENCOUNTER — Ambulatory Visit: Payer: Self-pay | Admitting: Internal Medicine

## 2023-11-20 ENCOUNTER — Other Ambulatory Visit: Payer: Self-pay | Admitting: Internal Medicine

## 2023-12-16 NOTE — Progress Notes (Signed)
 This encounter was created in error - please disregard.

## 2023-12-29 ENCOUNTER — Other Ambulatory Visit: Payer: Self-pay | Admitting: Internal Medicine

## 2023-12-30 ENCOUNTER — Telehealth: Payer: Self-pay

## 2023-12-30 NOTE — Telephone Encounter (Signed)
 She just had them done in August not needed

## 2023-12-30 NOTE — Telephone Encounter (Signed)
 Copied from CRM #[id number]. Topic: Clinical - Request for Lab/Test Order >> Dec 30, 2023  9:41 AM Diana Watson wrote: Reason for CRM: Patient would like to have a kidney urine and kidney blood test.She would like to know if orders for these labs could be placed. She stated that her [place] insurance would like for them to have it.

## 2024-01-01 NOTE — Telephone Encounter (Signed)
 Called patient and LVM in regards to this and for the husband as well both have already had labs done

## 2024-01-09 DIAGNOSIS — F3131 Bipolar disorder, current episode depressed, mild: Secondary | ICD-10-CM | POA: Diagnosis not present

## 2024-01-14 ENCOUNTER — Other Ambulatory Visit: Payer: Self-pay | Admitting: Internal Medicine

## 2024-01-16 ENCOUNTER — Ambulatory Visit: Payer: Self-pay

## 2024-01-16 NOTE — Telephone Encounter (Signed)
 FYI Only or Action Required?: FYI only for provider.  Patient was last seen in primary care on 11/08/2023 by Rollene Almarie LABOR, MD.  Called Nurse Triage reporting Hypertension.  Symptoms began a week ago.  Interventions attempted: Nothing.  Symptoms are: gradually worsening.  Triage Disposition: See PCP Within 2 Weeks  Patient/caregiver understands and will follow disposition?: Yes     Message from Mia F sent at 01/16/2024  7:46 AM EDT  Reason for Triage: Pt says when she went to the dentist her bp was 156/90 at specialist it was 166/95 and this morning 168/98. Not experiencing any symptoms but she would like to know what she should do.   Reason for Disposition  [1] Systolic BP >= 130 OR Diastolic >= 80 AND [2] taking BP medications  Answer Assessment - Initial Assessment Questions 1. BLOOD PRESSURE: What is your blood pressure? Did you take at least two measurements 5 minutes apart?     Yes, reading this morning was 168/98 2. ONSET: When did you take your blood pressure?     Last week   3. HOW: How did you take your blood pressure? (e.g., automatic home BP monitor, visiting nurse)     BP machine  4. HISTORY: Do you have a history of high blood pressure?     Yes  5. MEDICINES: Are you taking any medicines for blood pressure? Have you missed any doses recently?     Yes, has not missed any doses  6. OTHER SYMPTOMS: Do you have any symptoms? (e.g., blurred vision, chest pain, difficulty breathing, headache, weakness)     Denies   BP 156/90 at the dentist last week. She also saw a specialist the next day and BP was 166/95.  Protocols used: Blood Pressure - High-A-AH

## 2024-01-16 NOTE — Telephone Encounter (Signed)
**Note De-identified  [place] Obfuscation** Please advise 

## 2024-01-17 NOTE — Telephone Encounter (Signed)
 Pt was advised to be seen with PCP in two weeks and pt has been scheduled on 10/28 for elevated Bp

## 2024-01-17 NOTE — Telephone Encounter (Signed)
Please schedule office visit with any provider.  Thanks

## 2024-01-28 ENCOUNTER — Ambulatory Visit (INDEPENDENT_AMBULATORY_CARE_PROVIDER_SITE_OTHER): Admitting: Internal Medicine

## 2024-01-28 VITALS — BP 150/80 | HR 81 | Temp 97.8°F | Ht 65.0 in | Wt 145.8 lb

## 2024-01-28 DIAGNOSIS — I10 Essential (primary) hypertension: Secondary | ICD-10-CM

## 2024-01-28 NOTE — Progress Notes (Unsigned)
   Subjective:   Patient ID: Diana Watson, female    DOB: [birth year], 68 y.o.   MRN: [id number]  Discussed the use of AI scribe software for clinical note transcription with the patient, who gave verbal consent to proceed.  History of Present Illness Diana Watson is a 68 year old female with hypertension who presents with elevated blood pressure readings.  She has been experiencing elevated blood pressure readings, with the highest recorded at 160/98 mmHg and a current reading of 150/80 mmHg. No symptoms are associated with these elevated readings.  There have been no recent changes in her diet or exercise routine, although she acknowledges a decrease in physical activity. Her sodium intake remains consistent, but she has been drinking less water than usual due to being busy. Typically, she consumes a lot of water but has noticed a decrease in her intake over the past few weeks.  She has been dealing with dental issues, with all her remaining teeth being loose, which has been a source of stress. She wonders if this stress could be affecting her blood pressure.  Recently, she had a cold and was sick for a week, during which she took ibuprofen  for body aches and headaches. She experienced dizziness during this time.  She has not been taking any new over-the-counter medications aside from ibuprofen  during her recent illness.  Review of Systems  Constitutional: Negative.   HENT: Negative.    Eyes: Negative.   Respiratory:  Negative for cough, chest tightness and shortness of breath.   Cardiovascular:  Negative for chest pain, palpitations and leg swelling.  Gastrointestinal:  Negative for abdominal distention, abdominal pain, constipation, diarrhea, nausea and vomiting.  Musculoskeletal: Negative.   Skin: Negative.   Neurological: Negative.   Psychiatric/Behavioral: Negative.      Objective:  Physical Exam Constitutional:      Appearance: She is well-developed.  HENT:      Head: Normocephalic and atraumatic.  Cardiovascular:     Rate and Rhythm: Normal rate and regular rhythm.  Pulmonary:     Effort: Pulmonary effort is normal. No respiratory distress.     Breath sounds: Normal breath sounds. No wheezing or rales.  Abdominal:     General: Bowel sounds are normal. There is no distension.     Palpations: Abdomen is soft.     Tenderness: There is no abdominal tenderness.  Musculoskeletal:     Cervical back: Normal range of motion.  Skin:    General: Skin is warm and dry.  Neurological:     Mental Status: She is alert and oriented to person, place, and time.     Coordination: Coordination normal.     Vitals:   01/28/24 0808 01/28/24 0817  BP: (!) 150/78 (!) 150/80  Pulse: 81   Temp: 97.8 F (36.6 C)   TempSrc: Oral   SpO2: 98%   Weight: 145 lb 12.8 oz (66.1 kg)   Height: 5' 5 (1.651 m)     Assessment and Plan Assessment & Plan Hypertension   Recent blood pressure readings of 150/80 and 160/98 suggest possible transient elevation due to decreased water intake and ibuprofen  use. Long-term elevation risks vascular aging and plaque buildup. Increase water intake and counseled about dash diet. Reassess blood pressure in three weeks during follow-up.

## 2024-01-31 ENCOUNTER — Encounter: Payer: Self-pay | Admitting: Internal Medicine

## 2024-01-31 ENCOUNTER — Ambulatory Visit
Admission: RE | Admit: 2024-01-31 | Discharge: 2024-01-31 | Disposition: A | Discharge status: Home / Self Care | Source: Ambulatory Visit | Attending: Internal Medicine | Admitting: Internal Medicine

## 2024-01-31 DIAGNOSIS — Z1231 Encounter for screening mammogram for malignant neoplasm of breast: Secondary | ICD-10-CM | POA: Diagnosis not present

## 2024-01-31 NOTE — Assessment & Plan Note (Signed)
 Recent blood pressure readings of 150/80 and 160/98 suggest possible transient elevation due to decreased water intake and ibuprofen  use. Long-term elevation risks vascular aging and plaque buildup. Increase water intake and counseled about dash diet. Reassess blood pressure in three weeks during follow-up.

## 2024-02-04 ENCOUNTER — Ambulatory Visit: Payer: Self-pay | Admitting: Internal Medicine

## 2024-02-18 ENCOUNTER — Ambulatory Visit (INDEPENDENT_AMBULATORY_CARE_PROVIDER_SITE_OTHER): Admitting: Internal Medicine

## 2024-02-18 ENCOUNTER — Encounter: Payer: Self-pay | Admitting: Internal Medicine

## 2024-02-18 VITALS — BP 130/80 | HR 70 | Temp 97.7°F | Ht 65.0 in | Wt 146.4 lb

## 2024-02-18 DIAGNOSIS — I10 Essential (primary) hypertension: Secondary | ICD-10-CM | POA: Diagnosis not present

## 2024-02-18 DIAGNOSIS — Z7984 Long term (current) use of oral hypoglycemic drugs: Secondary | ICD-10-CM | POA: Diagnosis not present

## 2024-02-18 DIAGNOSIS — E1169 Type 2 diabetes mellitus with other specified complication: Secondary | ICD-10-CM

## 2024-02-18 LAB — POCT GLYCOSYLATED HEMOGLOBIN (HGB A1C): Hemoglobin A1C: 5.6 % (ref 4.0–5.6)

## 2024-02-18 NOTE — Patient Instructions (Addendum)
 You could get a tetanus booster shot and the RSV vaccine at the pharmacy.

## 2024-02-18 NOTE — Assessment & Plan Note (Signed)
 Her hypertension is well-controlled with a blood pressure of 130/80 mmHg. Continue current hypertension management plan.

## 2024-02-18 NOTE — Assessment & Plan Note (Signed)
 Her diabetes is well-controlled with a hemoglobin A1c of 5.6% and no reported hypoglycemia. She uses glimepiride  as needed. Taking rybelsus  14 mg daily. Continue current diabetes management encourage moderation in sugar intake and regular exercise.

## 2024-02-18 NOTE — Progress Notes (Signed)
   Subjective:   Patient ID: Diana Watson, female    DOB: April 05, 1955, 68 y.o.   MRN: [id number]  Discussed the use of AI scribe software for clinical note transcription with the patient, who gave verbal consent to proceed.  History of Present Illness Diana Watson is a 68 year old female with type 2 diabetes who presents for follow-up regarding blood sugar management and blood pressure control.  She experiences cravings for sugar and carbohydrates, although her hemoglobin A1c remains stable at 5.6%. She is concerned about these cravings. She monitors her blood sugar levels four to five times a day and notes they are steady. No low blood sugar episodes, such as shakiness or feeling 'out of it'. She takes Amaryl  (glimepiride ) as needed, not regularly, to manage her blood sugar levels.  She engages in physical activity, including going to the gym occasionally and doing strength exercises at home. She is mindful of her sugar intake, keeping it below 14-15 grams per serving. No stomach issues, muscle injuries, or joint pains.  Review of Systems  Constitutional: Negative.   HENT: Negative.    Eyes: Negative.   Respiratory:  Negative for cough, chest tightness and shortness of breath.   Cardiovascular:  Negative for chest pain, palpitations and leg swelling.  Gastrointestinal:  Negative for abdominal distention, abdominal pain, constipation, diarrhea, nausea and vomiting.  Musculoskeletal: Negative.   Skin: Negative.   Neurological: Negative.   Psychiatric/Behavioral: Negative.      Objective:  Physical Exam Constitutional:      Appearance: Normal appearance.  HENT:     Head: Normocephalic.  Cardiovascular:     Rate and Rhythm: Normal rate and regular rhythm.  Pulmonary:     Effort: Pulmonary effort is normal.  Musculoskeletal:        General: Normal range of motion.  Skin:    General: Skin is warm and dry.  Neurological:     General: No focal deficit present.      Mental Status: She is alert and oriented to person, place, and time.     Vitals:   02/18/24 0802  BP: 130/80  Pulse: 70  Temp: 97.7 F (36.5 C)  TempSrc: Oral  SpO2: 99%  Weight: 146 lb 6.4 oz (66.4 kg)  Height: 5' 5 (1.651 m)    Assessment and Plan Assessment & Plan Type 2 diabetes mellitus  with complication Her diabetes is well-controlled with a hemoglobin A1c of 5.6% and no reported hypoglycemia. She uses glimepiride  as needed. Taking rybelsus  14 mg daily. Continue current diabetes management encourage moderation in sugar intake and regular exercise.  Hypertension   Her hypertension is well-controlled with a blood pressure of 130/80 mmHg. Continue current hypertension management plan.

## 2024-03-20 ENCOUNTER — Other Ambulatory Visit: Payer: Self-pay | Admitting: Internal Medicine

## 2024-04-01 NOTE — Telephone Encounter (Signed)
 Copied from CRM #[id number]. Topic: Clinical - Medical Advice >> Apr 01, 2024  7:40 AM Diana Watson wrote: Reason for CRM: Patient called has had Nasal Drainage for several weeks. Patient has taken Loratadine and it has not helped. Patient would like to know if something can be prescribed or what to do-please call Home [phone]

## 2024-04-21 ENCOUNTER — Telehealth: Payer: Self-pay

## 2024-04-21 NOTE — Telephone Encounter (Signed)
 Copied from CRM #[id number]. Topic: Clinical - Medical Advice >> Apr 01, 2024  7:40 AM Victoria A wrote: Reason for CRM: Patient called has had Nasal Drainage for several weeks. Patient has taken Loratadine and it has not helped. Patient would like to know if something can be prescribed or what to do-please call Home [phone] >> Apr 21, 2024  8:13 AM Robinson H wrote: Patient following up on message sent on 12/31 regarding runny nose states she has a rash now on nose. Patient states she has tried Loratadine 10 mg and it didn't work, Cetirizine Hydrochloride 10mg  makes her drowsy. Found something that works Chlorpheniramine Maleate 4mg  antihistamine worked. Wants to know if she can have a prescription for that or tell her where she can find something with that ingredient only, or something else that works.  Edwyna [phone]

## 2024-04-22 NOTE — Telephone Encounter (Signed)
 I think that is over the counter and is safe to continue.

## 2024-04-24 ENCOUNTER — Ambulatory Visit

## 2024-04-24 VITALS — BP 140/86 | HR 76 | Ht 65.0 in | Wt 150.0 lb

## 2024-04-24 DIAGNOSIS — Z0000 Encounter for general adult medical examination without abnormal findings: Secondary | ICD-10-CM

## 2024-04-24 NOTE — Progress Notes (Signed)
 "  Chief Complaint  Patient presents with   Medicare Wellness     Subjective:   Diana Watson is a 69 y.o. female who presents for a Medicare Annual Wellness Visit.  Visit info / Clinical Intake: Medicare Wellness Visit Type:: Subsequent Annual Wellness Visit Persons participating in visit and providing information:: patient Medicare Wellness Visit Mode:: In-person (required for WTM) Interpreter Needed?: No Pre-visit prep was completed: yes AWV questionnaire completed by patient prior to visit?: no Living arrangements:: lives with spouse/significant other Patient's Overall Health Status Rating: (!) poor Typical amount of pain: none Does pain affect daily life?: no Are you currently prescribed opioids?: no  Dietary Habits and Nutritional Risks How many meals a day?: -- (per tp- binge eating due to depression x 1 month) Eats fruit and vegetables daily?: yes Most meals are obtained by: preparing own meals In the last 2 weeks, have you had any of the following?: none Diabetic:: (!) yes Any non-healing wounds?: no How often do you check your BS?: -- (5 times a day) Would you like to be referred to a Nutritionist or for Diabetic Management? 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) Uses hearing aids?: no Hear whispered voice?: yes  Fall Screening Falls in the past year?: 0 Number of falls in past year: 0 Was there an injury with Fall?: 0 Fall Risk Category Calculator: 0 Patient Fall Risk Level: Low Fall Risk  Fall Risk Patient at Risk for Falls Due  to: No Fall Risks Fall risk Follow up: Falls evaluation completed; Falls prevention discussed  Home and Transportation Safety: All rugs have non-skid backing?: N/A, no rugs All stairs or steps have railings?: yes (2 steps outside) Grab bars in the bathtub or shower?: (!) no Have non-skid surface in bathtub or shower?: (!) no Good home lighting?: yes Regular seat belt use?: yes Hospital stays in the last year:: no  Cognitive Assessment Difficulty concentrating, remembering, or making decisions? : yes Will 6CIT or Mini Cog be Completed: no 6CIT or Mini Cog Declined: patient has a diagnosis of dementia or cognitive impairment  Advance Directives (For Healthcare) Does Patient Have a Medical Advance Directive?: No  Reviewed/Updated  Reviewed/Updated: Reviewed All (Medical, Surgical, Family, Medications, Allergies, Care Teams, Patient Goals)    Allergies (verified) Prednisone   Current Medications (verified) Outpatient Encounter Medications as of 04/24/2024  Medication Sig   Alcohol  Swabs  (DROPSAFE ALCOHOL  PREP) 70 % PADS USE DAILY AS DIRECTED   aspirin 81 MG tablet Take 81 mg by mouth daily.   betamethasone  valerate ointment (VALISONE ) 0.1 % APPLY TO THE AFFECTED AREA(S) EVERY DAY   buPROPion (WELLBUTRIN XL) 300 MG 24 hr tablet Take 300 mg by mouth daily.   busPIRone (BUSPAR) 10 MG tablet Take 10 mg by mouth in the morning, at noon, and at bedtime.   Calcium  Citrate-Vitamin D  (CALCIUM  CITRATE + PO) Take by mouth.   diphenhydrAMINE (BENADRYL) 25 mg capsule Take 25 mg by mouth as needed for sleep (Take 2 CAPS by mouth at bedtime as needed).   DOCUSATE SODIUM  PO Take 400 mg by mouth.   glimepiride  (AMARYL ) 2 MG tablet Take 2 tablets (4 mg total) by mouth  daily with breakfast.   glucose blood (ACCU-CHEK AVIVA PLUS) test strip TEST BLOOD SUGAR FIVE TIMES DAILY   lamoTRIgine  (LAMICTAL ) 200 MG tablet Take 200 mg by mouth every morning. For mood control   lisinopril  (ZESTRIL ) 30 MG tablet  Take 2 tablets (60 mg total) by mouth daily.   Multiple Vitamin (MULTIVITAMIN WITH MINERALS) TABS Take 1 tablet by mouth every morning.    naproxen (NAPROSYN) 500 MG tablet Take 500 mg by mouth 3 (three) times daily as needed. For pain   rosuvastatin  (CRESTOR ) 5 MG tablet Take 1 tablet (5 mg total) by mouth daily.   RYBELSUS  14 MG TABS TAKE 1 TABLET EVERY DAY   ziprasidone  (GEODON ) 80 MG capsule Take 160 mg by mouth at bedtime.    clonazePAM  (KLONOPIN ) 0.5 MG tablet Take 0.5 mg by mouth daily. (Patient not taking: Reported on 04/24/2024)   No facility-administered encounter medications on file as of 04/24/2024.    History: Past Medical History:  Diagnosis Date   Bipolar 1 disorder (HCC)    Constipation    Depression    Diabetes mellitus    Herniated disc    Hyperlipidemia    Hypertension    Neurocognitive disorder    major neurocognitive disorder   Personality disorder [facility])    Past Surgical History:  Procedure Laterality Date   DILATION AND CURETTAGE OF UTERUS     RIGHT OOPHORECTOMY     Also removed a tumor at that time.   Family History  Problem Relation Age of Onset   Diabetes type II Mother    Hypertension Mother    OCD Mother    Bipolar disorder Mother    Hyperlipidemia Mother    Diabetes type II Father    Hypertension Father    Prostate cancer Father    Hyperlipidemia Father    Bipolar disorder Cousin    Pancreatic cancer Brother    Colon cancer Neg Hx    Breast cancer Neg Hx    Social History   Occupational History   Occupation: disability  Tobacco Use   Smoking status: Never   Smokeless tobacco: Never  Vaping Use   Vaping status: Never Used  Substance and Sexual Activity   Alcohol  use: No    Alcohol /week: 6.0 - 12.0 standard drinks of alcohol     Types: 6 - 12 Standard drinks or equivalent per week   Drug use: Never   Sexual activity: Yes    Birth control/protection: None   Tobacco Counseling Counseling given: Not Answered  SDOH Screenings    Food Insecurity: Food Insecurity Present (04/24/2024)  Housing: Unknown (04/24/2024)  Transportation Needs: No Transportation Needs (04/24/2024)  Utilities: Not At Risk (04/24/2024)  Alcohol  Screen: Low Risk (12/11/2022)  Depression (PHQ2-9): High Risk (04/24/2024)  Financial Resource Strain: High Risk (12/11/2022)  Physical Activity: Sufficiently Active (04/24/2024)  Social Connections: Socially Isolated (04/24/2024)  Stress: No Stress Concern Present (04/24/2024)  Tobacco Use: Low Risk (04/24/2024)  Health Literacy: Adequate Health Literacy (04/24/2024)   See flowsheets for full screening details  Depression Screen PHQ 2 & 9 Depression Scale- Over the past 2 weeks, how often have you been bothered by any of the following problems? Little interest or pleasure in doing things: 3 Feeling down, depressed, or hopeless (PHQ Adolescent also includes...irritable): 3 PHQ-2 Total Score: 6 Trouble falling or staying asleep, or sleeping too much: 0 Feeling tired or having little energy: 0 Poor appetite or overeating (PHQ Adolescent also includes...weight loss): 3 (overeating) Feeling bad about yourself -  or that you are a failure or have let yourself or your family down: 3 Trouble concentrating on things, such as reading the newspaper or watching television (PHQ Adolescent also includes...like school work): 3 Moving or speaking so slowly that other people could have noticed. Or the opposite - being so fidgety or restless that you have been moving around a lot more than usual: 0 Thoughts that you would be better off dead, or of hurting yourself in some way: 0 PHQ-9 Total Score: 15 If you checked off any problems, how difficult have these problems made it for you to do your work, take care of things at home, or get along with other people?: Not difficult at all     Goals Addressed               This Visit's Progress     Patient Stated (pt-stated)        None/2026             Objective:     Today's Vitals   04/24/24 0911  BP: (!) 140/86  Pulse: 76  SpO2: 100%  Weight: 150 lb (68 kg)  Height: 5' 5 (1.651 m)   Body mass index is 24.96 kg/m.  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 Immunizations and Health Maintenance Health Maintenance  Topic Date Due   DTaP/Tdap/Td (2 - Td or Tdap) 01/05/2022   Bone Density Scan  Never done   COVID-19 Vaccine (5 - 2025-26 season) 12/02/2023   Zoster Vaccines- Shingrix (2 of 2) 12/03/2023   OPHTHALMOLOGY EXAM  02/26/2024   Diabetic kidney evaluation - Urine ACR  05/10/2024   HEMOGLOBIN A1C  08/17/2024   FOOT EXAM  09/30/2024   Diabetic kidney evaluation - eGFR measurement  11/07/2024   Medicare Annual Wellness (AWV)  04/24/2025   Mammogram  01/30/2026   Colonoscopy  06/07/2026   Pneumococcal Vaccine: 50+ Years  Completed   Influenza Vaccine  Completed   Hepatitis C Screening  Completed   Meningococcal B Vaccine  Aged Out        Assessment/Plan:  This is a routine wellness examination for Diana Watson.  Patient Care Team: Rollene Almarie LABOR, MD as PCP - General (Internal Medicine) Readling, Darina BIRCH, MD as Consulting Physician (Psychiatry) Georjean Darice HERO, MD as Consulting Physician (Neurology) Fate Morna SAILOR, [facility] (Inactive) as Pharmacist (Pharmacist)  I have personally reviewed and noted the following in the patients chart:   Medical and social history Use of alcohol , tobacco or illicit drugs  Current medications and supplements including opioid prescriptions. Functional ability and status Nutritional status Physical activity Advanced directives List of other physicians Hospitalizations, surgeries, and ER visits in previous 12 months Vitals Screenings to include cognitive, depression, and falls Referrals and appointments  No orders of the defined types were placed in this encounter.  In addition, I have reviewed and discussed with patient certain  preventive protocols, quality metrics, and best practice recommendations. A written personalized care plan for preventive services as well as general preventive health recommendations were provided to patient.   Diana Watson L Zellie Jenning, CMA   04/24/2024   Return in 1 year (on 04/24/2025).  After Visit Summary: (MyChart) Due to this being a telephonic visit, the after visit summary with patients personalized plan was offered to patient via MyChart   Nurse Notes: Patient is due for a DEXA and order has been placed today.  Patient stated that she has had her diabetic  eye exam recently.  I have sent a request for results out today.  Patient stated that she has felt tingling finger tips x 2 months now and that she would like to discuss during her up coming office visit with provider, (PLEASE ADVISE PATIENT). "

## 2024-04-24 NOTE — Patient Instructions (Addendum)
 Diana Watson,  Thank you for taking the time for your Medicare Wellness Visit. I appreciate your continued commitment to your health goals. Please review the care plan we discussed, and feel free to reach out if I can assist you further.  Please note that Annual Wellness Visits do not include a physical exam. Some assessments may be limited, especially if the visit was conducted virtually. If needed, we may recommend an in-person follow-up with your provider.  Ongoing Care Seeing your primary care provider every 3 to 6 months helps us  monitor your health and provide consistent, personalized care. Next office visit on 05/22/2024.    Referrals If a referral was made during today's visit and you haven't received any updates within two weeks, please contact the referred provider directly to check on the status. Please call below to get scheduled for a Bone Density screening:  You have an order for:   [x]   Bone Density    Please call for appointment:  [facility] [address] [place], [place] [address] [phone]  [facility] [address] [place], [place] [address] [phone]    Make sure to wear two-piece clothing.  No lotions, powders, or deodorants the day of the appointment. Make sure to bring picture ID and insurance card.  Bring list of medications you are currently taking including any supplements.   Recommended Screenings:  Health Maintenance  Topic Date Due   DTaP/Tdap/Td vaccine (2 - Td or Tdap) 01/05/2022   Osteoporosis screening with Bone Density Scan  Never done   COVID-19 Vaccine (5 - 2025-26 season) 12/02/2023   Zoster (Shingles) Vaccine (2 of 2) 12/03/2023   Medicare Annual Wellness Visit  12/11/2023   Eye exam for diabetics  02/26/2024   Kidney health urinalysis for diabetes  05/10/2024   Hemoglobin A1C  08/17/2024   Complete foot exam   09/30/2024   Yearly kidney function blood test for  diabetes  11/07/2024   Breast Cancer Screening  01/30/2026   Colon Cancer Screening  06/07/2026   Pneumococcal Vaccine for age over 49  Completed   Flu Shot  Completed   Hepatitis C Screening  Completed   Meningitis B Vaccine  Aged Out       04/24/2024    8:49 AM  Advanced Directives  Does Patient Have a Medical Advance Directive? No    Vision: Annual vision screenings are recommended for early detection of glaucoma, cataracts, and diabetic retinopathy. These exams can also reveal signs of chronic conditions such as diabetes and high blood pressure.  Dental: Annual dental screenings help detect early signs of oral cancer, gum disease, and other conditions linked to overall health, including heart disease and diabetes.  Please see the attached documents for additional preventive care recommendations.

## 2024-04-24 NOTE — Telephone Encounter (Signed)
 It is not a prescription to my knowledge

## 2024-04-24 NOTE — Telephone Encounter (Signed)
 Per DPR ok to leave detailed message relaying Dr. Marcel message.

## 2024-05-22 ENCOUNTER — Ambulatory Visit: Admitting: Internal Medicine
